# Patient Record
Sex: Male | Born: 1960
Health system: Southern US, Community
[De-identification: ages and names within clinical notes are randomized; demographics above are authoritative.]

## PROBLEM LIST (undated history)

## (undated) DIAGNOSIS — I214 Non-ST elevation (NSTEMI) myocardial infarction: Secondary | ICD-10-CM

## (undated) DIAGNOSIS — E785 Hyperlipidemia, unspecified: Secondary | ICD-10-CM

## (undated) DIAGNOSIS — Z720 Tobacco use: Secondary | ICD-10-CM

## (undated) DIAGNOSIS — F32A Depression, unspecified: Secondary | ICD-10-CM

## (undated) DIAGNOSIS — I2510 Atherosclerotic heart disease of native coronary artery without angina pectoris: Secondary | ICD-10-CM

## (undated) DIAGNOSIS — I519 Heart disease, unspecified: Secondary | ICD-10-CM

## (undated) DIAGNOSIS — I9789 Other postprocedural complications and disorders of the circulatory system, not elsewhere classified: Secondary | ICD-10-CM

## (undated) DIAGNOSIS — I4891 Unspecified atrial fibrillation: Secondary | ICD-10-CM

## (undated) DIAGNOSIS — Z860100 Personal history of colon polyps, unspecified: Secondary | ICD-10-CM

## (undated) DIAGNOSIS — Z5189 Encounter for other specified aftercare: Secondary | ICD-10-CM

## (undated) DIAGNOSIS — M1990 Unspecified osteoarthritis, unspecified site: Secondary | ICD-10-CM

## (undated) DIAGNOSIS — Z8619 Personal history of other infectious and parasitic diseases: Secondary | ICD-10-CM

## (undated) DIAGNOSIS — Z806 Family history of leukemia: Secondary | ICD-10-CM

## (undated) HISTORY — DX: Unspecified osteoarthritis, unspecified site: M19.90

## (undated) HISTORY — DX: Personal history of other infectious and parasitic diseases: Z86.19

## (undated) HISTORY — DX: Family history of leukemia: Z80.6

## (undated) HISTORY — DX: Encounter for other specified aftercare: Z51.89

## (undated) HISTORY — DX: Other postprocedural complications and disorders of the circulatory system, not elsewhere classified: I97.89

## (undated) HISTORY — DX: Unspecified atrial fibrillation: I48.91

## (undated) HISTORY — DX: Personal history of colon polyps, unspecified: Z86.0100

## (undated) HISTORY — DX: Heart disease, unspecified: I51.9

## (undated) HISTORY — DX: Personal history of colonic polyps: Z86.010

## (undated) HISTORY — DX: Depression, unspecified: F32.A

---

## 2005-11-07 ENCOUNTER — Emergency Department: Payer: Self-pay | Admitting: General Practice

## 2010-08-12 ENCOUNTER — Encounter: Payer: Self-pay | Admitting: Family Medicine

## 2010-08-12 ENCOUNTER — Ambulatory Visit (INDEPENDENT_AMBULATORY_CARE_PROVIDER_SITE_OTHER): Payer: 59 | Admitting: Family Medicine

## 2010-08-12 ENCOUNTER — Other Ambulatory Visit: Payer: Self-pay | Admitting: Family Medicine

## 2010-08-12 DIAGNOSIS — F17200 Nicotine dependence, unspecified, uncomplicated: Secondary | ICD-10-CM | POA: Insufficient documentation

## 2010-08-12 DIAGNOSIS — R5381 Other malaise: Secondary | ICD-10-CM

## 2010-08-12 DIAGNOSIS — R5383 Other fatigue: Secondary | ICD-10-CM

## 2010-08-12 DIAGNOSIS — E291 Testicular hypofunction: Secondary | ICD-10-CM

## 2010-08-12 DIAGNOSIS — Z125 Encounter for screening for malignant neoplasm of prostate: Secondary | ICD-10-CM

## 2010-08-12 DIAGNOSIS — Z0000 Encounter for general adult medical examination without abnormal findings: Secondary | ICD-10-CM

## 2010-08-12 DIAGNOSIS — Z136 Encounter for screening for cardiovascular disorders: Secondary | ICD-10-CM

## 2010-08-12 DIAGNOSIS — E785 Hyperlipidemia, unspecified: Secondary | ICD-10-CM

## 2010-08-12 LAB — BASIC METABOLIC PANEL
BUN: 17 mg/dL (ref 6–23)
CO2: 28 mEq/L (ref 19–32)
Calcium: 9.4 mg/dL (ref 8.4–10.5)
Chloride: 105 mEq/L (ref 96–112)
Creatinine, Ser: 1.1 mg/dL (ref 0.4–1.5)
GFR: 73.95 mL/min (ref >60.00)
Glucose, Bld: 85 mg/dL (ref 70–99)
Potassium: 4.3 mEq/L (ref 3.5–5.1)
Sodium: 140 mEq/L (ref 135–145)

## 2010-08-12 LAB — CBC WITH DIFFERENTIAL/PLATELET
Basophils Absolute: 0 10*3/uL (ref 0.0–0.1)
Basophils Relative: 0.5 % (ref 0.0–3.0)
Eosinophils Absolute: 0.2 10*3/uL (ref 0.0–0.7)
Eosinophils Relative: 2.5 % (ref 0.0–5.0)
HCT: 45.1 % (ref 39.0–52.0)
Hemoglobin: 15.3 g/dL (ref 13.0–17.0)
Lymphocytes Relative: 31 % (ref 12.0–46.0)
Lymphs Abs: 2.4 10*3/uL (ref 0.7–4.0)
MCHC: 34 g/dL (ref 30.0–36.0)
MCV: 93.8 fl (ref 78.0–100.0)
Monocytes Absolute: 0.7 10*3/uL (ref 0.1–1.0)
Monocytes Relative: 8.9 % (ref 3.0–12.0)
Neutro Abs: 4.3 10*3/uL (ref 1.4–7.7)
Neutrophils Relative %: 57.1 % (ref 43.0–77.0)
Platelets: 283 10*3/uL (ref 150.0–400.0)
RBC: 4.8 Mil/uL (ref 4.22–5.81)
RDW: 13.4 % (ref 11.5–14.6)
WBC: 7.6 10*3/uL (ref 4.5–10.5)

## 2010-08-12 LAB — LIPID PANEL
Cholesterol: 229 mg/dL — ABNORMAL HIGH (ref 0–200)
HDL: 42.6 mg/dL (ref >39.00)
Total CHOL/HDL Ratio: 5
Triglycerides: 117 mg/dL (ref 0.0–149.0)
VLDL: 23.4 mg/dL (ref 0.0–40.0)

## 2010-08-12 LAB — B12 AND FOLATE PANEL
Folate: 14.3 ng/mL (ref >5.9)
Vitamin B-12: 198 pg/mL — ABNORMAL LOW (ref 211–911)

## 2010-08-12 LAB — PSA: PSA: 1.16 ng/mL (ref 0.10–4.00)

## 2010-08-12 LAB — TSH: TSH: 0.81 u[IU]/mL (ref 0.35–5.50)

## 2010-08-12 LAB — CONVERTED CEMR LAB: Vit D, 25-Hydroxy: 35 ng/mL (ref 30–89)

## 2010-08-12 LAB — TESTOSTERONE: Testosterone: 214.97 ng/dL — ABNORMAL LOW (ref 350.00–890.00)

## 2010-08-13 LAB — LDL CHOLESTEROL, DIRECT: Direct LDL: 170.7 mg/dL

## 2010-08-14 ENCOUNTER — Encounter: Payer: Self-pay | Admitting: Family Medicine

## 2010-08-14 DIAGNOSIS — R7989 Other specified abnormal findings of blood chemistry: Secondary | ICD-10-CM | POA: Insufficient documentation

## 2010-08-19 NOTE — Assessment & Plan Note (Signed)
Summary: NEW PT TO EST/CPX/CLE   [employer],MAILED NPP   Vital Signs:  Patient profile:   50 year old male Height:      68 inches Weight:      162.25 pounds BMI:     24.76 Temp:     98.1 degrees F oral Pulse rate:   69 / minute Pulse rhythm:   regular BP sitting:   126 / 82  (right arm) Cuff size:   regular  Vitals Entered By: Linde Gillis CMA Duncan Dull) (August 12, 2010 9:56 AM) CC: new patient, establish care, CPX   History of Present Illness: 50 yo here to establish care and for CPX.  H/o low testosterone- previously on Androgel per pt.  Feels like it is likely low. More fatigue lately.  Denies any sexual dysfunction or depression. No CP or SOB. Denies any symptoms of hypothyroidism.  Tobacco abuse- 30 pack year history.  Only quit once for 3 months during boot camp for [employer]. Has never used patches, gums or other smoking cessation aids.  Well man- states he has not had lab work in a couple of years but that always been normal. Overall in good health but "feels older."  Preventive Screening-Counseling & Management  Alcohol-Tobacco     Smoking Status: current  Caffeine-Diet-Exercise     Does Patient Exercise: yes      Drug Use:  no.    Current Medications (verified): 1)  Chantix Starting Month Pak 0.5 Mg X 11 & 1 Mg X 42  Misc (Varenicline Tartrate) .... 0.5mg  By Mouth Once Daily For 3 Days, Then Twice Daily For 4 Days and Then 1mg  By Mouth 2 Times Daily  Allergies (verified): No Known Drug Allergies  Past History:  Family History: Last updated: 08/12/2010 Dad had MI at 52 Mom RA  Social History: Last updated: 08/12/2010 Married, 7 children Current Smoker Alcohol use-yes Drug use-no Regular exercise-yes  Risk Factors: Exercise: yes (08/12/2010)  Risk Factors: Smoking Status: current (08/12/2010)  Past Medical History: Unremarkable  Past Surgical History: Denies surgical history  Family History: Dad had MI at 18 Mom RA  Social  History: Married, 7 children Current Smoker Alcohol use-yes Drug use-no Regular exercise-yes Smoking Status:  current Drug Use:  no Does Patient Exercise:  yes  Review of Systems      See HPI General:  Complains of fatigue and malaise; denies fever and loss of appetite. Eyes:  Denies blurring. ENT:  Denies difficulty swallowing. CV:  Denies chest pain or discomfort. Resp:  Denies shortness of breath. GI:  Denies abdominal pain, bloody stools, and change in bowel habits. GU:  Denies decreased libido, discharge, dysuria, and erectile dysfunction. MS:  Denies joint pain, joint redness, and joint swelling. Derm:  Denies rash. Neuro:  Denies headaches and visual disturbances. Psych:  Denies anxiety and depression. Endo:  Denies cold intolerance and heat intolerance. Heme:  Denies abnormal bruising and bleeding.  Physical Exam  General:  alert, well-developed, well-nourished, and well-hydrated.   Head:  normocephalic and atraumatic.   Eyes:  vision grossly intact, pupils equal, pupils round, and pupils reactive to light.   Ears:  R ear normal and no external deformities.   Nose:  no external deformity.   Mouth:  no gingival abnormalities.   Neck:  No deformities, masses, or tenderness noted. Lungs:  Normal respiratory effort, chest expands symmetrically. Lungs are clear to auscultation, no crackles or wheezes. Heart:  Normal rate and regular rhythm. S1 and S2 normal without gallop, murmur,  click, rub or other extra sounds. Abdomen:  Bowel sounds positive,abdomen soft and non-tender without masses, organomegaly or hernias noted. Msk:  No deformity or scoliosis noted of thoracic or lumbar spine.   Extremities:  no edema Neurologic:  alert & oriented X3 and strength normal in all extremities.   Skin:  Intact without suspicious lesions or rashes Psych:  Cognition and judgment appear intact. Alert and cooperative with normal attention span and concentration. No apparent delusions,  illusions, hallucinations   Impression & Recommendations:  Problem # 1:  Preventive Health Care (ICD-V70.0) Reviewed preventive care protocols, scheduled due services, and updated immunizations Discussed nutrition, exercise, diet, and healthy lifestyle.  BMET, FLP, PSA today (pt counselled and pt requested PSA). Orders: TLB-BMP (Basic Metabolic Panel-BMET) (80048-METABOL)  Problem # 2:  FATIGUE (ICD-780.79) Assessment: New will check labs, including testosterone. fatigue is likely multifactorial. Orders: TLB-CBC Platelet - w/Differential (85025-CBCD) TLB-TSH (Thyroid Stimulating Hormone) (84443-TSH) Specimen Handling (08657) TLB-B12 + Folate Pnl [phone]) T-Vitamin D (25-Hydroxy) (01027-25366)  Problem # 3:  TOBACCO ABUSE (ICD-305.1) Assessment: Deteriorated counselled on smoking cessation. pt ready to quit.  discussed different options and pt would like to try Chantix (no h/o SI).  discussed side effects and pt information handout given about chantix. His updated medication list for this problem includes:    Chantix Starting Month Pak 0.5 Mg X 11 & 1 Mg X 42 Misc (Varenicline tartrate) .Marland Kitchen... 0.5mg  by mouth once daily for 3 days, then twice daily for 4 days and then 1mg  by mouth 2 times daily  Complete Medication List: 1)  Chantix Starting Month Pak 0.5 Mg X 11 & 1 Mg X 42 Misc (Varenicline tartrate) .... 0.5mg  by mouth once daily for 3 days, then twice daily for 4 days and then 1mg  by mouth 2 times daily  Other Orders: Venipuncture (44034) TLB-Lipid Panel (80061-LIPID) TLB-Testosterone, Total (84403-TESTO) TLB-PSA (Prostate Specific Antigen) (84153-PSA)  Patient Instructions: 1)  Great to meet you. 2)  You can buy glucosamine supplements over the counter.  Please do not take more than 2000 mg daily. Prescriptions: CHANTIX STARTING MONTH PAK 0.5 MG X 11 & 1 MG X 42  MISC (VARENICLINE TARTRATE) 0.5mg  by mouth once daily for 3 days, then twice daily for 4 days  and then 1mg  by mouth 2 times daily  #1 pack x 0   Entered and Authorized by:   Ruthe Mannan MD   Signed by:   Ruthe Mannan MD on 08/12/2010   Method used:   Print then Give to Patient   RxID:   [id number]    Orders Added: 1)  Venipuncture [95188] 2)  TLB-Lipid Panel [80061-LIPID] 3)  TLB-BMP (Basic Metabolic Panel-BMET) [80048-METABOL] 4)  TLB-CBC Platelet - w/Differential [85025-CBCD] 5)  TLB-TSH (Thyroid Stimulating Hormone) [84443-TSH] 6)  Specimen Handling [99000] 7)  TLB-B12 + Folate Pnl [82746_82607-B12/FOL] 8)  TLB-Testosterone, Total [84403-TESTO] 9)  T-Vitamin D (25-Hydroxy) [41660-63016] 10)  TLB-PSA (Prostate Specific Antigen) [01093-ATF] 11)  New Patient 40-64 years [99386] 12)  New Patient Level II [99202]    Prior Medications (reviewed today): None Current Allergies (reviewed today): No known allergies

## 2010-08-19 NOTE — Miscellaneous (Signed)
Summary: Orders Update  Clinical Lists Changes  Problems: Added new problem of TESTICULAR HYPOFUNCTION (ICD-257.2) Orders: Added new Referral order of Urology Referral (Urology) - Signed 

## 2011-01-26 ENCOUNTER — Encounter: Payer: Self-pay | Admitting: Family Medicine

## 2011-01-28 ENCOUNTER — Ambulatory Visit (INDEPENDENT_AMBULATORY_CARE_PROVIDER_SITE_OTHER)
Admission: RE | Admit: 2011-01-28 | Discharge: 2011-01-28 | Disposition: A | Discharge status: Home / Self Care | Payer: 59 | Source: Ambulatory Visit | Attending: Family Medicine | Admitting: Family Medicine

## 2011-01-28 ENCOUNTER — Ambulatory Visit (INDEPENDENT_AMBULATORY_CARE_PROVIDER_SITE_OTHER): Payer: 59 | Admitting: Family Medicine

## 2011-01-28 ENCOUNTER — Encounter: Payer: Self-pay | Admitting: Family Medicine

## 2011-01-28 VITALS — BP 120/72 | HR 75 | Temp 97.4°F | Ht 68.0 in | Wt 160.8 lb

## 2011-01-28 DIAGNOSIS — M25562 Pain in left knee: Secondary | ICD-10-CM

## 2011-01-28 DIAGNOSIS — M25569 Pain in unspecified knee: Secondary | ICD-10-CM

## 2011-01-28 DIAGNOSIS — M6752 Plica syndrome, left knee: Secondary | ICD-10-CM | POA: Insufficient documentation

## 2011-01-28 DIAGNOSIS — M6750 Plica syndrome, unspecified knee: Secondary | ICD-10-CM

## 2011-01-28 NOTE — Progress Notes (Signed)
  Subjective:    Patient ID: Jeffrey Spence, male    DOB: 1961-02-10, 50 y.o.   MRN: [id number]  HPI  Pleasant 50 year old male with a history of L knee pain for 2 years or more, history significant for an injury to the extensor of knee as a child in elem school. No surgery then, but did have to rehab the area. This is anterior medial knee pain.   As a child, tore a muscle, maybe quad or other. Now it has gotten more and more sore. Anterior and medial pain. 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 No swelling. Pain with getting up and moving his leg  When sitting down and when trying to get up - will have some pain. Does OK with going up and down stairs.  No locking up of his joint or giving way.  The PMH, PSH, Social History, Family History, Medications, and allergies have been reviewed in [facility], and have been updated if relevant.  Review of Systems REVIEW OF SYSTEMS  GEN: No fevers, chills. Nontoxic. Primarily MSK c/o today. MSK: Detailed in the HPI GI: tolerating PO intake without difficulty Neuro: No numbness, parasthesias, or tingling associated. Otherwise the pertinent positives of the ROS are noted above.      Objective:   Physical Exam   Physical Exam  Blood pressure 120/72, pulse 75, temperature 97.4 F (36.3 C), temperature source Oral, height 5\' 8"  (1.727 m), weight 160 lb 12.8 oz (72.938 kg), SpO2 98.00%.  GEN: Well-developed,well-nourished,in no acute distress; alert,appropriate and cooperative throughout examination HEENT: Normocephalic and atraumatic without obvious abnormalities. Ears, externally no deformities PULM: Breathing comfortably in no respiratory distress EXT: No clubbing, cyanosis, or edema PSYCH: Normally interactive. Cooperative during the interview. Pleasant. Friendly and conversant. Not anxious or depressed appearing. Normal, full affect.  Gait: Normal heel toe pattern ROM: WNL Effusion: neg Echymosis or edema: none Patellar tendon NT Painful PLICA:  LARGE MEDIAL PLICAL BANDS X 2 MEDIALLY, AND 1 ALSO MEDIAL AND NEAR PATELLAR TENDON Patellar grind: minimal pos Medial and lateral patellar facet loading: negative medial and lateral joint lines:NT Mcmurray's neg Flexion-pinch neg Varus and valgus stress: stable Lachman: neg Ant and Post drawer: neg Hip abduction, IR, ER: WNL Hip flexion str: 5/5 Hip abd: 5/5 Quad: 5/5 VMO atrophy:No Hamstring concentric and eccentric: 5/5       Assessment & Plan:   1. Plica syndrome of left knee    2. Left knee pain  DG Knee 1-2 Views Left, DG Knee Bilateral Standing AP   X-rays: AP Bilateral Weight-bearing, Weightbearing Lateral, Sunrise views Indication: knee pain Findings: No significant OA of the knee. Good alignment  Plica Syndrome  Painful plica band ID'd on exam Massage for several weeks with hand multiple times a day and with an ice cube Plica Inj today  Ultimately, if all treatment fails, surgical synovectomy could be considered.  Knee Inj, Left Patient verbally consented to procedure. Risks, benefits, and alternatives explained including pain, treatment failure and lightening of the skin. Sterilely prepped with betadine. Ethyl cholride used for anesthesia. 4 cc Lidocaine 1% mixed with 1 cc of Kenalog 40 mg injected along the more medial plical band of left knee then the inferior band. No complications with procedure and tolerated well. Patient had decreased pain post-injection.

## 2011-01-28 NOTE — Patient Instructions (Signed)
PLICA BAND or PLICA on the knee  Band of tissue / Kink in the synovial lining. The joint lining.  Use your fingers a few times a day for the next few weeks to massage that area to help break down the tissue. You can also use an ice cube.  

## 2011-06-23 HISTORY — PX: OTHER SURGICAL HISTORY: SHX169

## 2011-06-29 ENCOUNTER — Telehealth: Payer: Self-pay | Admitting: *Deleted

## 2011-06-29 DIAGNOSIS — S99921A Unspecified injury of right foot, initial encounter: Secondary | ICD-10-CM

## 2011-06-29 NOTE — Telephone Encounter (Signed)
Patient called stating that he dropped a log on his foot Saturday at work and he was wearing steel toe shoes. He states that he has a lot of bruising. Patient states that he thinks he is okay, but thinks that he should have it x-rayed and wants to know if you will order an x-ray for him? Patient states that he did report this to this employer.

## 2011-06-29 NOTE — Telephone Encounter (Signed)
Nikki,  Please find out more information- which foot? We can order the xray but I need to know which foot and if it's foot only. Thanks.

## 2011-06-30 NOTE — Telephone Encounter (Signed)
Left message on cell phone voicemail for patient to return call. 

## 2011-06-30 NOTE — Telephone Encounter (Signed)
Referral placed.

## 2011-06-30 NOTE — Telephone Encounter (Signed)
Spoke with patient and he stated that it's his right foot, no other injuries per patient.

## 2011-06-30 NOTE — Telephone Encounter (Signed)
Patient will wait to hear from [facility] regarding referral for xray.

## 2011-07-02 ENCOUNTER — Encounter: Payer: Self-pay | Admitting: Family Medicine

## 2011-07-02 ENCOUNTER — Telehealth: Payer: Self-pay | Admitting: Internal Medicine

## 2011-07-02 DIAGNOSIS — S92909A Unspecified fracture of unspecified foot, initial encounter for closed fracture: Secondary | ICD-10-CM

## 2011-07-02 NOTE — Telephone Encounter (Signed)
[facility] called and stated he has a broke foot.  He had his CD with him.

## 2011-07-02 NOTE — Telephone Encounter (Signed)
Ok will refer to ortho.

## 2011-07-02 NOTE — Telephone Encounter (Signed)
Called patient and left a message on cell phone voicemail for him to return call.

## 2011-07-03 NOTE — Telephone Encounter (Signed)
Patient advised as instructed via telephone, he would like to see someone in [place].  He will wait to hear from [facility].

## 2012-10-28 ENCOUNTER — Other Ambulatory Visit: Payer: Self-pay | Admitting: Family Medicine

## 2012-10-28 DIAGNOSIS — Z125 Encounter for screening for malignant neoplasm of prostate: Secondary | ICD-10-CM

## 2012-10-28 DIAGNOSIS — Z8639 Personal history of other endocrine, nutritional and metabolic disease: Secondary | ICD-10-CM

## 2012-10-28 DIAGNOSIS — Z136 Encounter for screening for cardiovascular disorders: Secondary | ICD-10-CM

## 2012-10-28 DIAGNOSIS — Z0000 Encounter for general adult medical examination without abnormal findings: Secondary | ICD-10-CM

## 2012-11-15 ENCOUNTER — Other Ambulatory Visit (INDEPENDENT_AMBULATORY_CARE_PROVIDER_SITE_OTHER): Payer: 59

## 2012-11-15 DIAGNOSIS — Z8639 Personal history of other endocrine, nutritional and metabolic disease: Secondary | ICD-10-CM

## 2012-11-15 DIAGNOSIS — Z0000 Encounter for general adult medical examination without abnormal findings: Secondary | ICD-10-CM

## 2012-11-15 DIAGNOSIS — Z136 Encounter for screening for cardiovascular disorders: Secondary | ICD-10-CM

## 2012-11-15 DIAGNOSIS — Z125 Encounter for screening for malignant neoplasm of prostate: Secondary | ICD-10-CM

## 2012-11-15 LAB — LIPID PANEL
Cholesterol: 202 mg/dL — ABNORMAL HIGH (ref 0–200)
HDL: 38.1 mg/dL — ABNORMAL LOW (ref >39.00)
Total CHOL/HDL Ratio: 5
Triglycerides: 129 mg/dL (ref 0.0–149.0)
VLDL: 25.8 mg/dL (ref 0.0–40.0)

## 2012-11-15 LAB — COMPREHENSIVE METABOLIC PANEL
ALT: 17 U/L (ref 0–53)
AST: 17 U/L (ref 0–37)
Albumin: 3.8 g/dL (ref 3.5–5.2)
Alkaline Phosphatase: 89 U/L (ref 39–117)
BUN: 15 mg/dL (ref 6–23)
CO2: 24 mEq/L (ref 19–32)
Calcium: 9.1 mg/dL (ref 8.4–10.5)
Chloride: 107 mEq/L (ref 96–112)
Creatinine, Ser: 1 mg/dL (ref 0.4–1.5)
GFR: 85.49 mL/min (ref >60.00)
Glucose, Bld: 91 mg/dL (ref 70–99)
Potassium: 4 mEq/L (ref 3.5–5.1)
Sodium: 140 mEq/L (ref 135–145)
Total Bilirubin: 0.7 mg/dL (ref 0.3–1.2)
Total Protein: 6.4 g/dL (ref 6.0–8.3)

## 2012-11-15 LAB — VITAMIN B12: Vitamin B-12: 291 pg/mL (ref 211–911)

## 2012-11-15 LAB — PSA: PSA: 0.89 ng/mL (ref 0.10–4.00)

## 2012-11-15 LAB — LDL CHOLESTEROL, DIRECT: Direct LDL: 140.9 mg/dL

## 2012-11-17 ENCOUNTER — Other Ambulatory Visit: Payer: 59

## 2012-11-17 ENCOUNTER — Encounter: Payer: 59 | Admitting: Family Medicine

## 2012-11-23 ENCOUNTER — Telehealth: Payer: Self-pay

## 2012-11-23 NOTE — Telephone Encounter (Signed)
Pt had seen lab results on Mychart but could not see PSA result.  I advised pt Dr Dayton Martes would discuss lab results at his 01/12/13 CPX; pt understood that but wanted to make sure PSA was done and was within normal limits. Advised pt PSA was 0.89 and normal range is 0.1 - 4. Pt said OK and would discuss with Dr Dayton Martes at appt.

## 2013-01-12 ENCOUNTER — Encounter: Payer: Self-pay | Admitting: Gastroenterology

## 2013-01-12 ENCOUNTER — Encounter: Payer: Self-pay | Admitting: Family Medicine

## 2013-01-12 ENCOUNTER — Ambulatory Visit (INDEPENDENT_AMBULATORY_CARE_PROVIDER_SITE_OTHER): Payer: 59 | Admitting: Family Medicine

## 2013-01-12 VITALS — BP 102/70 | HR 68 | Temp 97.5°F | Ht 67.75 in | Wt 167.0 lb

## 2013-01-12 DIAGNOSIS — Z0000 Encounter for general adult medical examination without abnormal findings: Secondary | ICD-10-CM | POA: Insufficient documentation

## 2013-01-12 DIAGNOSIS — M179 Osteoarthritis of knee, unspecified: Secondary | ICD-10-CM | POA: Insufficient documentation

## 2013-01-12 DIAGNOSIS — D229 Melanocytic nevi, unspecified: Secondary | ICD-10-CM

## 2013-01-12 DIAGNOSIS — M1710 Unilateral primary osteoarthritis, unspecified knee: Secondary | ICD-10-CM

## 2013-01-12 DIAGNOSIS — D239 Other benign neoplasm of skin, unspecified: Secondary | ICD-10-CM

## 2013-01-12 DIAGNOSIS — IMO0002 Reserved for concepts with insufficient information to code with codable children: Secondary | ICD-10-CM

## 2013-01-12 DIAGNOSIS — Z1211 Encounter for screening for malignant neoplasm of colon: Secondary | ICD-10-CM

## 2013-01-12 DIAGNOSIS — F17200 Nicotine dependence, unspecified, uncomplicated: Secondary | ICD-10-CM

## 2013-01-12 NOTE — Progress Notes (Signed)
Subjective:    Patient ID: Jeffrey Spence, male    DOB: 05-Jul-1960, 52 y.o.   MRN: [id number]  HPI  52 yo pleasant male here for CPX.  Still smoking 1 ppd- not ready to quit.  Very physically active job.  Knees hurt him after he sits for prolonged period of time.  No back pain.  Left knee often swollen at end of the day. Not taking any NSAIDs.  He is working on diet- admits to not eating well.  Lab Results  Component Value Date   CHOL 202* 11/15/2012   HDL 38.10* 11/15/2012   LDLDIRECT 140.9 11/15/2012   TRIG 129.0 11/15/2012   CHOLHDL 5 11/15/2012   Lab Results  Component Value Date   PSA 0.89 11/15/2012   PSA 1.16 08/12/2010   Lab Results  Component Value Date   CREATININE 1.0 11/15/2012   Patient Active Problem List   Diagnosis Date Noted  . Routine general medical examination at a health care facility 01/12/2013  . TESTICULAR HYPOFUNCTION 08/14/2010  . TOBACCO ABUSE 08/12/2010   No past medical history on file. No past surgical history on file. History  Substance Use Topics  . Smoking status: Current Every Day Smoker  . Smokeless tobacco: Not on file  . Alcohol Use: Yes   Family History  Problem Relation Age of Onset  . Arthritis Mother     ra  . Heart attack Father 6   No Known Allergies Current Outpatient Prescriptions on File Prior to Visit  Medication Sig Dispense Refill  . acetaminophen (TYLENOL) 500 MG tablet Take 1,000 mg by mouth every 6 (six) hours as needed.        . vitamin B-12 (CYANOCOBALAMIN) 1000 MCG tablet Take 1,000 mcg by mouth daily.         No current facility-administered medications on file prior to visit.   The PMH, PSH, Social History, Family History, Medications, and allergies have been reviewed in [facility], and have been updated if relevant.   Review of Systems See HPI Patient reports no  vision/ hearing changes,anorexia, weight change, fever ,adenopathy, persistant / recurrent hoarseness, swallowing issues, chest pain, edema,persistant  / recurrent cough, hemoptysis, dyspnea(rest, exertional, paroxysmal nocturnal), gastrointestinal  bleeding (melena, rectal bleeding), abdominal pain, excessive heart burn, GU symptoms(dysuria, hematuria, pyuria, voiding/incontinence  Issues) syncope, focal weakness, severe memory loss, concerning skin lesions, depression, anxiety, abnormal bruising/bleeding     Objective:   Physical Exam BP 102/70  Pulse 68  Temp(Src) 97.5 F (36.4 C)  Ht 5' 7.75" (1.721 m)  Wt 167 lb (75.751 kg)  BMI 25.58 kg/m2 General:  Pleasant male in NAD Eyes:  PERRL Ears:  External ear exam shows no significant lesions or deformities.  Otoscopic examination reveals clear canals, tympanic membranes are intact bilaterally without bulging, retraction, inflammation or discharge. Hearing is grossly normal bilaterally. Nose:  External nasal examination shows no deformity or inflammation. Nasal mucosa are pink and moist without lesions or exudates. Mouth:  Oral mucosa and oropharynx without lesions or exudates.  Teeth in good repair. Neck:  no carotid bruit or thyromegaly no cervical or supraclavicular lymphadenopathy  Lungs:  Normal respiratory effort, chest expands symmetrically. Lungs are clear to auscultation, no crackles or wheezes. Heart:  Normal rate and regular rhythm. S1 and S2 normal without gallop, murmur, click, rub or other extra sounds. Abdomen:  Bowel sounds positive,abdomen soft and non-tender without masses, organomegaly or hernias noted. Pulses:  R and L posterior tibial pulses are full and equal bilaterally  Extremities:  no edema  Skin:  Raised atypical nevus right temple        Assessment & Plan:  1. Routine general medical examination at a health care facility Reviewed preventive care protocols, scheduled due services, and updated immunizations Discussed nutrition, exercise, diet, and healthy lifestyle.   2. TOBACCO ABUSE Not ready to quit.  Did discuss importance of quitting in terms of his  health.  Also has FH of CAD.  3. Special screening for malignant neoplasms, colon  - Ambulatory referral to Gastroenterology  4. OA (osteoarthritis) of knee Advised NSAIDs in low dose with food (see AVS).  5. Atypical nevus  - Ambulatory referral to Dermatology

## 2013-01-12 NOTE — Patient Instructions (Addendum)
Great to see you. Start taking 1 alleve in the mornings with food and an extra strength tylenol (650 mg ) in the evening.  Keep me updated with your symptoms.  Please stop by to see Shirlee Limerick on your way out to set up your referral for your colonoscopy.

## 2013-01-20 ENCOUNTER — Ambulatory Visit (INDEPENDENT_AMBULATORY_CARE_PROVIDER_SITE_OTHER): Payer: 59 | Admitting: Family Medicine

## 2013-01-20 ENCOUNTER — Encounter: Payer: Self-pay | Admitting: Family Medicine

## 2013-01-20 VITALS — BP 118/78 | HR 76 | Temp 97.9°F | Ht 67.75 in | Wt 167.0 lb

## 2013-01-20 DIAGNOSIS — M7989 Other specified soft tissue disorders: Secondary | ICD-10-CM | POA: Insufficient documentation

## 2013-01-20 NOTE — Progress Notes (Signed)
  Subjective:    Patient ID: Jeffrey Spence, male    DOB: [birth year], 52 y.o.   MRN: [id number]  HPI  Very pleasant 52 yo here for two weeks of swelling above his right wrist.  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  No known injury.  Was briefly painful one day but has not hurt since. No erythema or warmth.  He is taking Advil and Tylenol for OA.  Patient Active Problem List   Diagnosis Date Noted  . Swelling of arm 01/20/2013  . Routine general medical examination at a health care facility 01/12/2013  . OA (osteoarthritis) of knee 01/12/2013  . TESTICULAR HYPOFUNCTION 08/14/2010  . TOBACCO ABUSE 08/12/2010   No past medical history on file. No past surgical history on file. History  Substance Use Topics  . Smoking status: Current Every Day Smoker  . Smokeless tobacco: Not on file  . Alcohol Use: Yes   Family History  Problem Relation Age of Onset  . Arthritis Mother     ra  . Heart attack Father 90  . Cancer Father   . Cancer Paternal Uncle     leukemia   No Known Allergies Current Outpatient Prescriptions on File Prior to Visit  Medication Sig Dispense Refill  . acetaminophen (TYLENOL) 500 MG tablet Take 1,000 mg by mouth every 6 (six) hours as needed.        . Multiple Vitamin (MULTIVITAMIN) tablet Take 1 tablet by mouth daily.      . vitamin B-12 (CYANOCOBALAMIN) 1000 MCG tablet Take 1,000 mcg by mouth daily.         No current facility-administered medications on file prior to visit.   The PMH, PSH, Social History, Family History, Medications, and allergies have been reviewed in [facility], and have been updated if relevant.    Review of Systems    See HPI No CP No SOB Objective:   Physical Exam  Constitutional: He appears well-developed and well-nourished. No distress.  HENT:  Head: Normocephalic and atraumatic.  Musculoskeletal:       Arms: Psychiatric: He has a normal mood and affect. His speech is normal and behavior is normal. Judgment and thought  content normal. Cognition and memory are normal.   BP 118/78  Pulse 76  Temp(Src) 97.9 F (36.6 C)  Ht 5' 7.75" (1.721 m)  Wt 167 lb (75.751 kg)  BMI 25.58 kg/m2        Assessment & Plan:  1. Swelling of arm New- most likely a tendonopathy.  No consistent with gout or OA. Rule out DVT with doppler. Start ASA 81 mg (ok to hold NSAID for now). The patient indicates understanding of these issues and agrees with the plan.  - Upper extremity Venous Duplex Right; Future

## 2013-01-20 NOTE — Patient Instructions (Addendum)
Good to see you. Please stop by to see Jeffrey Spence on your way out to set up your ultrasound. Over the weekend, please start taking Aspirin 81 mg daily.  Once we have your results, we will come up with a long term plan based on the findings. Have a good weekend.

## 2013-01-25 ENCOUNTER — Telehealth: Payer: Self-pay | Admitting: *Deleted

## 2013-01-25 ENCOUNTER — Ambulatory Visit: Payer: Self-pay | Admitting: Family Medicine

## 2013-01-25 NOTE — Telephone Encounter (Signed)
Advised patient of results from ultrasound done this morning.  Copy of report sent for scanning.

## 2013-01-31 ENCOUNTER — Encounter: Payer: Self-pay | Admitting: Family Medicine

## 2013-02-22 ENCOUNTER — Ambulatory Visit (AMBULATORY_SURGERY_CENTER): Payer: 59 | Admitting: *Deleted

## 2013-02-22 VITALS — Ht 67.75 in | Wt 160.4 lb

## 2013-02-22 DIAGNOSIS — Z1211 Encounter for screening for malignant neoplasm of colon: Secondary | ICD-10-CM

## 2013-02-22 MED ORDER — PREPOPIK 10-3.5-12 MG-GM-GM PO PACK: 1.0000 | PACK | Freq: Once | ORAL | Status: DC

## 2013-02-22 NOTE — Progress Notes (Signed)
No allergies to eggs or soy. No problems with anesthesia.  

## 2013-02-23 ENCOUNTER — Encounter: Payer: Self-pay | Admitting: Gastroenterology

## 2013-03-08 ENCOUNTER — Encounter: Payer: Self-pay | Admitting: Gastroenterology

## 2013-03-08 ENCOUNTER — Ambulatory Visit (AMBULATORY_SURGERY_CENTER): Payer: 59 | Admitting: Gastroenterology

## 2013-03-08 VITALS — BP 111/73 | HR 64 | Temp 97.0°F | Resp 16 | Ht 67.0 in | Wt 160.0 lb

## 2013-03-08 DIAGNOSIS — D126 Benign neoplasm of colon, unspecified: Secondary | ICD-10-CM

## 2013-03-08 DIAGNOSIS — Z1211 Encounter for screening for malignant neoplasm of colon: Secondary | ICD-10-CM

## 2013-03-08 MED ORDER — SODIUM CHLORIDE 0.9 % IV SOLN: 500.0000 mL | INTRAVENOUS | Status: DC

## 2013-03-08 NOTE — Progress Notes (Signed)
Called to room to assist during endoscopic procedure.  Patient ID and intended procedure confirmed with present staff. Received instructions for my participation in the procedure from the performing physician.  

## 2013-03-08 NOTE — Progress Notes (Signed)
Lidocaine-40mg IV prior to Propofol InductionPropofol given over incremental dosages 

## 2013-03-08 NOTE — Progress Notes (Signed)
Patient did not have preoperative order for IV antibiotic SSI prophylaxis. (G8918)  Patient did not experience any of the following events: a burn prior to discharge; a fall within the facility; wrong site/side/patient/procedure/implant event; or a hospital transfer or hospital admission upon discharge from the facility. (G8907)  

## 2013-03-08 NOTE — Patient Instructions (Addendum)
Impressions/recommendations:  Polyps (handout given) Hemorrhoids (handout given)  Hold aspirin, aspirin containing products and antiinflammatory medicines for two weeks.  Repeat colonoscopy to be determined after pathology review.  YOU HAD AN ENDOSCOPIC PROCEDURE TODAY AT THE [facility]: Refer to the procedure report that was given to you for any specific questions about what was found during the examination.  If the procedure report does not answer your questions, please call your gastroenterologist to clarify.  If you requested that your care partner not be given the details of your procedure findings, then the procedure report has been included in a sealed envelope for you to review at your convenience later.  YOU SHOULD EXPECT: Some feelings of bloating in the abdomen. Passage of more gas than usual.  Walking can help get rid of the air that was put into your GI tract during the procedure and reduce the bloating. If you had a lower endoscopy (such as a colonoscopy or flexible sigmoidoscopy) you may notice spotting of blood in your stool or on the toilet paper. If you underwent a bowel prep for your procedure, then you may not have a normal bowel movement for a few days.  DIET: Your first meal following the procedure should be a light meal and then it is ok to progress to your normal diet.  A half-sandwich or bowl of soup is an example of a good first meal.  Heavy or fried foods are harder to digest and may make you feel nauseous or bloated.  Likewise meals heavy in dairy and vegetables can cause extra gas to form and this can also increase the bloating.  Drink plenty of fluids but you should avoid alcoholic beverages for 24 hours.  ACTIVITY: Your care partner should take you home directly after the procedure.  You should plan to take it easy, moving slowly for the rest of the day.  You can resume normal activity the day after the procedure however you should NOT DRIVE or use heavy  machinery for 24 hours (because of the sedation medicines used during the test).    SYMPTOMS TO REPORT IMMEDIATELY: A gastroenterologist can be reached at any hour.  During normal business hours, 8:30 AM to 5:00 PM Monday through Friday, call [phone].  After hours and on weekends, please call the GI answering service at [phone] who will take a message and have the physician on call contact you.   Following lower endoscopy (colonoscopy or flexible sigmoidoscopy):  Excessive amounts of blood in the stool  Significant tenderness or worsening of abdominal pains  Swelling of the abdomen that is new, acute  Fever of 100F or higher   FOLLOW UP: If any biopsies were taken you will be contacted by phone or by letter within the next 1-3 weeks.  Call your gastroenterologist if you have not heard about the biopsies in 3 weeks.  Our staff will call the home number listed on your records the next business day following your procedure to check on you and address any questions or concerns that you may have at that time regarding the information given to you following your procedure. This is a courtesy call and so if there is no answer at the home number and we have not heard from you through the emergency physician on call, we will assume that you have returned to your regular daily activities without incident.  SIGNATURES/CONFIDENTIALITY: You and/or your care partner have signed paperwork which will be entered into your electronic medical record.  These signatures attest to the fact that that the information above on your After Visit Summary has been reviewed and is understood.  Full responsibility of the confidentiality of this discharge information lies with you and/or your care-partner.

## 2013-03-08 NOTE — Op Note (Addendum)
[facility] [address] [place], [address]   COLONOSCOPY PROCEDURE REPORT  PATIENT: Jeffrey Spence, Jeffrey Spence  MR#: [id number] BIRTHDATE: 10/10/1960 , 51  yrs. old GENDER: Male ENDOSCOPIST: Meryl Dare, MD, [facility] REFERRED XB:JYNWG Aron, M.D. PROCEDURE DATE:  03/08/2013 PROCEDURE:   Colonoscopy with snare polypectomy First Screening Colonoscopy - Avg.  risk and is 50 yrs.  old or older Yes.  Prior Negative Screening - Now for repeat screening. N/A  History of Adenoma - Now for follow-up colonoscopy & has been > or = to 3 yrs.  N/A  Polyps Removed Today? Yes. ASA CLASS:   Class I INDICATIONS:average risk screening. MEDICATIONS: MAC sedation, administered by CRNA and propofol (Diprivan) 450mg  IV DESCRIPTION OF PROCEDURE:   After the risks benefits and alternatives of the procedure were thoroughly explained, informed consent was obtained.  A digital rectal exam revealed no abnormalities of the rectum.   The LB [id number]  endoscope was introduced through the anus and advanced to the cecum, which was identified by both the appendix and ileocecal valve. No adverse events experienced.   The quality of the prep was Prepopik good The instrument was then slowly withdrawn as the colon was fully examined.  COLON FINDINGS: Two sessile polyps measuring 7-8 mm in size were found at the cecum and in the ascending colon.  A polypectomy was performed using snare cautery.  The resection was complete and the polyp tissue was completely retrieved.   Ten sessile polyps measuring 5-8 mm in size were found in the transverse colon.  A polypectomy was performed with a cold snare and using snare cautery.  The resection was complete and the polyp tissue was completely retrieved.   Two pedunculated polyps measuring 1 cm in size were found in the descending colon.  A polypectomy was performed using snare cautery.  The resection was complete and the polyp tissue was completely retrieved.    A sessile polyp measuring 5 mm in size was found in the descending colon.  A polypectomy was performed with a cold snare.  The resection was complete and the polyp tissue was completely retrieved.   The colon was otherwise normal.  There was no diverticulosis, inflammation, polyps or cancers unless previously stated.  Retroflexed views revealed small internal hemorrhoids. The time to cecum=2 minutes 12 seconds. Withdrawal time=24 minutes 20 seconds.  The scope was withdrawn and the procedure completed. COMPLICATIONS: There were no complications.   ENDOSCOPIC IMPRESSION: 1.   Two sessile polyps measuring 7-8 mm at the cecum and in the ascending colon; polypectomy performed using snare cautery 2.   Tweleve sessile polyps measuring 5-8 mm in the transverse colon; polypectomy performed with a cold snare and using snare cautery 3.   Two pedunculated polyps measuring 1 cm in the descending colon; polypectomy performed using snare cautery 4.   Sessile polyp measuring 5 mm in the descending colon; polypectomy performed with a cold snare 5.   Small internal hemorrhoids  RECOMMENDATIONS: 1.  Await pathology results 2.  Hold aspirin, aspirin products, and anti-inflammatory medication for 2 weeks. 3.  Colonoscopy follow up interval to be determined after pathology review  eSigned:  Meryl Dare, MD, [facility] 03/08/2013 10:49 AM Revised: 03/08/2013 10:49 AM     PATIENT NAME:  Jeffrey Spence, Jeffrey Spence MR#: [id number]

## 2013-03-09 ENCOUNTER — Telehealth: Payer: Self-pay | Admitting: *Deleted

## 2013-03-09 NOTE — Telephone Encounter (Signed)
No answer, left message to call if questions or concerns. 

## 2013-03-14 ENCOUNTER — Encounter: Payer: Self-pay | Admitting: Gastroenterology

## 2013-04-27 ENCOUNTER — Other Ambulatory Visit: Payer: Self-pay

## 2014-02-01 ENCOUNTER — Encounter: Payer: 59 | Admitting: Family Medicine

## 2014-02-01 DIAGNOSIS — Z0289 Encounter for other administrative examinations: Secondary | ICD-10-CM

## 2014-02-02 ENCOUNTER — Telehealth: Payer: Self-pay | Admitting: Family Medicine

## 2014-02-02 NOTE — Telephone Encounter (Signed)
Opened in error

## 2015-01-24 ENCOUNTER — Encounter: Payer: Self-pay | Admitting: Gastroenterology

## 2015-03-27 ENCOUNTER — Ambulatory Visit (AMBULATORY_SURGERY_CENTER): Payer: 59 | Admitting: *Deleted

## 2015-03-27 VITALS — Ht 68.0 in | Wt 164.6 lb

## 2015-03-27 DIAGNOSIS — Z86010 Personal history of colonic polyps: Secondary | ICD-10-CM

## 2015-03-27 MED ORDER — NA SULFATE-K SULFATE-MG SULF 17.5-3.13-1.6 GM/177ML PO SOLN: ORAL | Status: DC

## 2015-03-27 NOTE — Progress Notes (Signed)
No allergies to eggs or soy. No problems with anesthesia.  Pt given Emmi instructions for colonoscopy  No oxygen use  No diet drug use  

## 2015-04-10 ENCOUNTER — Encounter: Payer: Self-pay | Admitting: Gastroenterology

## 2015-04-10 ENCOUNTER — Ambulatory Visit (AMBULATORY_SURGERY_CENTER): Payer: 59 | Admitting: Gastroenterology

## 2015-04-10 VITALS — BP 114/75 | HR 67 | Temp 97.1°F | Resp 19 | Ht 68.0 in | Wt 164.0 lb

## 2015-04-10 DIAGNOSIS — D123 Benign neoplasm of transverse colon: Secondary | ICD-10-CM | POA: Diagnosis not present

## 2015-04-10 DIAGNOSIS — D124 Benign neoplasm of descending colon: Secondary | ICD-10-CM

## 2015-04-10 DIAGNOSIS — D128 Benign neoplasm of rectum: Secondary | ICD-10-CM

## 2015-04-10 DIAGNOSIS — D129 Benign neoplasm of anus and anal canal: Secondary | ICD-10-CM

## 2015-04-10 DIAGNOSIS — Z86010 Personal history of colonic polyps: Secondary | ICD-10-CM | POA: Diagnosis present

## 2015-04-10 DIAGNOSIS — D125 Benign neoplasm of sigmoid colon: Secondary | ICD-10-CM | POA: Diagnosis not present

## 2015-04-10 MED ORDER — SODIUM CHLORIDE 0.9 % IV SOLN: 500.0000 mL | INTRAVENOUS | Status: DC

## 2015-04-10 NOTE — Progress Notes (Signed)
Called to room to assist during endoscopic procedure.  Patient ID and intended procedure confirmed with present staff. Received instructions for my participation in the procedure from the performing physician.  

## 2015-04-10 NOTE — Progress Notes (Signed)
Report to PACU, RN, vss, BBS= Clear.  

## 2015-04-10 NOTE — Op Note (Signed)
[place]  [address] [place], [address]   COLONOSCOPY PROCEDURE REPORT  PATIENT: Jeffrey Spence, Jeffrey Spence  MR#: [id number] BIRTHDATE: 11-Jul-1960 , 75  yrs. old GENDER: male ENDOSCOPIST: Ladene Artist, MD, [facility] REFERRED BY:  Arnette Norris, M.D. PROCEDURE DATE:  04/10/2015 PROCEDURE:   Colonoscopy, surveillance , Colonoscopy with biopsy, and Colonoscopy with snare polypectomy First Screening Colonoscopy - Avg.  risk and is 50 yrs.  old or older - No.  Prior Negative Screening - Now for repeat screening. N/A  History of Adenoma - Now for follow-up colonoscopy & has been > or = to 3 yrs.  No.  It has been less than 3 yrs since last colonoscopy.  Medical reason.  Polyps removed today? Yes ASA CLASS:   Class II INDICATIONS:Surveillance due to prior colonic neoplasia and Advanced Neoplasm (> 10 adenomatous polyps). MEDICATIONS: Monitored anesthesia care and Propofol 300 mg IV DESCRIPTION OF PROCEDURE:   After the risks benefits and alternatives of the procedure were thoroughly explained, informed consent was obtained.  The digital rectal exam revealed no abnormalities of the rectum.   The LB [id number]  endoscope was introduced through the anus and advanced to the cecum, which was identified by both the appendix and ileocecal valve. No adverse events experienced.   The quality of the prep was excellent. (Suprep was used)  The instrument was then slowly withdrawn as the colon was fully examined. Estimated blood loss is zero unless otherwise noted in this procedure report.       COLON FINDINGS: Seven sessile polyps measuring 4-5 mm in size were found in the rectum (1), sigmoid colon (2), and transverse colon (4).  Polypectomies were performed with cold forceps.  The resection was complete, the polyp tissue was completely retrieved and sent to histology. Ten sessile polyps measuring 6-9 mm in size were found in the rectum (1) descending colon (4), and  transverse colon (5).  Polypectomies were performed with a cold snare.  The resection was complete, the polyp tissue was completely retrieved and sent to histology.   The examination was otherwise normal. Retroflexed views revealed internal Grade I hemorrhoids. The time to cecum = 13.4 Withdrawal time = 18.1   The scope was withdrawn and the procedure completed. COMPLICATIONS: There were no immediate complications.  ENDOSCOPIC IMPRESSION: 1.   Seven sessile polyps in the rectum, sigmoid, and transverse colon; polypectomies performed with cold forceps 2.   Ten sessile polyps in the rectum, descending, and transverse colon; polypectomies performed with a cold snare 3.   Grade l internal hemorrhoids  RECOMMENDATIONS: 1.  Hold Aspirin and all other NSAIDS for 2 weeks. 2.  Await pathology results 3.  Genetic testing evaluation 4.  Repeat Colonoscopy in 1-2 years pending genetics evaluation and pathology review  eSigned:  Ladene Artist, MD, [facility] 04/10/2015 2:45 PM   [C   PATIENT NAME:  [name], [name] MR#: [id number]

## 2015-04-10 NOTE — Patient Instructions (Signed)
YOU HAD AN ENDOSCOPIC PROCEDURE TODAY AT [facility]:   Refer to the procedure report that was given to you for any specific questions about what was found during the examination.  If the procedure report does not answer your questions, please call your gastroenterologist to clarify.  If you requested that your care partner not be given the details of your procedure findings, then the procedure report has been included in a sealed envelope for you to review at your convenience later.  YOU SHOULD EXPECT: Some feelings of bloating in the abdomen. Passage of more gas than usual.  Walking can help get rid of the air that was put into your GI tract during the procedure and reduce the bloating. If you had a lower endoscopy (such as a colonoscopy or flexible sigmoidoscopy) you may notice spotting of blood in your stool or on the toilet paper. If you underwent a bowel prep for your procedure, you may not have a normal bowel movement for a few days.  Please Note:  You might notice some irritation and congestion in your nose or some drainage.  This is from the oxygen used during your procedure.  There is no need for concern and it should clear up in a day or so.  SYMPTOMS TO REPORT IMMEDIATELY:   Following lower endoscopy (colonoscopy or flexible sigmoidoscopy):  Excessive amounts of blood in the stool  Significant tenderness or worsening of abdominal pains  Swelling of the abdomen that is new, acute  Fever of 100F or higher    For urgent or emergent issues, a gastroenterologist can be reached at any hour by calling [phone].   DIET: Your first meal following the procedure should be a small meal and then it is ok to progress to your normal diet. Heavy or fried foods are harder to digest and may make you feel nauseous or bloated.  Likewise, meals heavy in dairy and vegetables can increase bloating.  Drink plenty of fluids but you should avoid alcoholic beverages for 24  hours.  ACTIVITY:  You should plan to take it easy for the rest of today and you should NOT DRIVE or use heavy machinery until tomorrow (because of the sedation medicines used during the test).    FOLLOW UP: Our staff will call the number listed on your records the next business day following your procedure to check on you and address any questions or concerns that you may have regarding the information given to you following your procedure. If we do not reach you, we will leave a message.  However, if you are feeling well and you are not experiencing any problems, there is no need to return our call.  We will assume that you have returned to your regular daily activities without incident.  If any biopsies were taken you will be contacted by phone or by letter within the next 1-3 weeks.  Please call us at [phone] if you have not heard about the biopsies in 3 weeks.    SIGNATURES/CONFIDENTIALITY: You and/or your care partner have signed paperwork which will be entered into your electronic medical record.  These signatures attest to the fact that that the information above on your After Visit Summary has been reviewed and is understood.  Full responsibility of the confidentiality of this discharge information lies with you and/or your care-partner.   Hold Aspirin and all NSAIDS for 2 weeks,but resume remainder of medications.. Office will contact you for Genetic Testing.

## 2015-04-11 ENCOUNTER — Telehealth: Payer: Self-pay

## 2015-04-11 NOTE — Telephone Encounter (Signed)
Left a message for the pt at [phone]  to call to call us back if any questions or concerns. maw

## 2015-04-17 ENCOUNTER — Encounter: Payer: Self-pay | Admitting: Gastroenterology

## 2015-06-23 DIAGNOSIS — Z5189 Encounter for other specified aftercare: Secondary | ICD-10-CM

## 2015-06-23 DIAGNOSIS — I214 Non-ST elevation (NSTEMI) myocardial infarction: Secondary | ICD-10-CM

## 2015-06-23 HISTORY — DX: Non-ST elevation (NSTEMI) myocardial infarction: I21.4

## 2015-06-23 HISTORY — DX: Encounter for other specified aftercare: Z51.89

## 2015-07-19 ENCOUNTER — Ambulatory Visit (INDEPENDENT_AMBULATORY_CARE_PROVIDER_SITE_OTHER)
Admission: RE | Admit: 2015-07-19 | Discharge: 2015-07-19 | Disposition: A | Discharge status: Home / Self Care | Payer: 59 | Source: Ambulatory Visit | Attending: Primary Care | Admitting: Primary Care

## 2015-07-19 ENCOUNTER — Encounter: Payer: Self-pay | Admitting: Primary Care

## 2015-07-19 ENCOUNTER — Ambulatory Visit (INDEPENDENT_AMBULATORY_CARE_PROVIDER_SITE_OTHER): Payer: 59 | Admitting: Primary Care

## 2015-07-19 VITALS — BP 124/74 | HR 74 | Temp 97.4°F | Ht 67.75 in | Wt 171.0 lb

## 2015-07-19 DIAGNOSIS — R002 Palpitations: Secondary | ICD-10-CM

## 2015-07-19 DIAGNOSIS — M25512 Pain in left shoulder: Secondary | ICD-10-CM

## 2015-07-19 LAB — BASIC METABOLIC PANEL
BUN: 15 mg/dL (ref 6–23)
CO2: 27 mEq/L (ref 19–32)
Calcium: 9 mg/dL (ref 8.4–10.5)
Chloride: 106 mEq/L (ref 96–112)
Creatinine, Ser: 0.98 mg/dL (ref 0.40–1.50)
GFR: 84.62 mL/min (ref >60.00)
Glucose, Bld: 93 mg/dL (ref 70–99)
Potassium: 3.8 mEq/L (ref 3.5–5.1)
Sodium: 139 mEq/L (ref 135–145)

## 2015-07-19 LAB — CBC
HCT: 44.6 % (ref 39.0–52.0)
Hemoglobin: 14.9 g/dL (ref 13.0–17.0)
MCHC: 33.4 g/dL (ref 30.0–36.0)
MCV: 91.1 fl (ref 78.0–100.0)
Platelets: 319 10*3/uL (ref 150.0–400.0)
RBC: 4.9 Mil/uL (ref 4.22–5.81)
RDW: 13.6 % (ref 11.5–15.5)
WBC: 7.7 10*3/uL (ref 4.0–10.5)

## 2015-07-19 LAB — TSH: TSH: 2.63 u[IU]/mL (ref 0.35–4.50)

## 2015-07-19 LAB — LIPID PANEL
Cholesterol: 223 mg/dL — ABNORMAL HIGH (ref 0–200)
HDL: 42.9 mg/dL (ref >39.00)
LDL Cholesterol: 155 mg/dL — ABNORMAL HIGH (ref 0–99)
NonHDL: 180.16
Total CHOL/HDL Ratio: 5
Triglycerides: 127 mg/dL (ref 0.0–149.0)
VLDL: 25.4 mg/dL (ref 0.0–40.0)

## 2015-07-19 NOTE — Progress Notes (Signed)
Subjective:    Patient ID: Jeffrey Spence, male    DOB: 13-Jul-1960, 55 y.o.   MRN: [id number]  HPI  Mr. Filar is a 55 year old male who presents today with multiple complaints.  1) Shoulder pain: Pain is located to his left shoulder and has been present for the past 6 months to 1 year. 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 His pain is most bothersome with abduction and posterior movement. He does not take any OTC medications for his shoulder pain. No increased pain or numbness/tingling.   2) Palpitations: His palpitations have been present for the past 2 nights around 5 am (works 12 hour night shifts). He's noticed a "pounding" heart beat. Denies tachycardia. Once he rested the sensation dissipated. Last night he was off from work, he was sitting on the couch around 5 am and noticed the pounding heart beat again. The sensation dissipated after a few seconds. Denies chest pain, shortness of breath, nausea, dizziness. He does endorse drinking caffeine such as [employer] and coffee and has done so for years. No recent increase in consumption.  Review of Systems  Respiratory: Negative for shortness of breath.   Cardiovascular: Positive for palpitations. Negative for chest pain.  Gastrointestinal: Negative for nausea.  Musculoskeletal: Positive for arthralgias.  Neurological: Positive for headaches. Negative for dizziness.       Past Medical History  Diagnosis Date  . Arthritis     Social History   Social History  . Marital Status: Married    Spouse Name: N/A  . Number of Children: 7  . Years of Education: N/A   Occupational History  .     Social History Main Topics  . Smoking status: Current Every Day Smoker -- 1.00 packs/day    Types: Cigarettes  . Smokeless tobacco: Never Used  . Alcohol Use: 0.0 oz/week    0 Standard drinks or equivalent per week     Comment: rare  . Drug Use: No  . Sexual Activity: Not on  file   Other Topics Concern  . Not on file   Social History Narrative   Regular exercise--yes    Past Surgical History  Procedure Laterality Date  . Fractured toe Right 2013    great toe/with pin    Family History  Problem Relation Age of Onset  . Arthritis Mother     ra  . Heart attack Father 56  . Cancer Father   . Cancer Paternal Uncle     leukemia  . Colon cancer Neg Hx     No Known Allergies  Current Outpatient Prescriptions on File Prior to Visit  Medication Sig Dispense Refill  . acetaminophen (TYLENOL) 500 MG tablet Take 1,000 mg by mouth every 6 (six) hours as needed.      . Multiple Vitamin (MULTIVITAMIN) tablet Take 1 tablet by mouth daily.    . vitamin B-12 (CYANOCOBALAMIN) 1000 MCG tablet Take 1,000 mcg by mouth daily.       No current facility-administered medications on file prior to visit.    BP 124/74 mmHg  Pulse 74  Temp(Src) 97.4 F (36.3 C) (Oral)  Ht 5' 7.75" (1.721 m)  Wt 171 lb (77.565 kg)  BMI 26.19 kg/m2  SpO2 97%    Objective:   Physical Exam  Constitutional: He appears well-nourished.  Cardiovascular: Normal rate and regular rhythm.   Pulmonary/Chest: Effort normal and breath sounds normal.  Skin:  Skin is warm and dry.          Assessment & Plan:  Palpitations:  Occurred over the past 2 days at 5 am. One episode during exertion, one at rest. Lasted several seconds. ECG unremarkable with NSR, good R wave progression, no ST elevation or depression.  Due to age and FH of MI, will obtain labs to rule out other causes of palpitations. Suspect this is likely due to caffeine consumption and discussed to reduce his intake. He is to notify myself or PCP if palpitations continue.  Shoulder Pain:  Chronic. Present for 1 year. Suspect injury to ligament due to repetitive motion from operating machines. Discussed RICE. Xray pending. Discussed also seeing Dr. Lorelei Pont as he may need further work up including MRI.

## 2015-07-19 NOTE — Progress Notes (Signed)
Pre visit review using our clinic review tool, if applicable. No additional management support is needed unless otherwise documented below in the visit note. 

## 2015-07-19 NOTE — Patient Instructions (Addendum)
Complete lab work prior to leaving today. I will notify you of your results once received.   Complete xray(s) prior to leaving today. I will notify you of your results once received.  Your ECG does not show any evidence of heart attack or damage. Please notify me if your palpitations continue.   I'll be in touch soon.  It was a pleasure meeting you!

## 2015-07-22 ENCOUNTER — Emergency Department: Payer: 59

## 2015-07-22 ENCOUNTER — Observation Stay: Admission: EM | Admit: 2015-07-22 | Discharge: 2015-07-23 | Payer: 59 | Attending: Specialist | Admitting: Specialist

## 2015-07-22 DIAGNOSIS — E785 Hyperlipidemia, unspecified: Secondary | ICD-10-CM | POA: Insufficient documentation

## 2015-07-22 DIAGNOSIS — Z8249 Family history of ischemic heart disease and other diseases of the circulatory system: Secondary | ICD-10-CM | POA: Diagnosis not present

## 2015-07-22 DIAGNOSIS — Z806 Family history of leukemia: Secondary | ICD-10-CM | POA: Diagnosis not present

## 2015-07-22 DIAGNOSIS — R109 Unspecified abdominal pain: Secondary | ICD-10-CM | POA: Diagnosis present

## 2015-07-22 DIAGNOSIS — Z8261 Family history of arthritis: Secondary | ICD-10-CM | POA: Insufficient documentation

## 2015-07-22 DIAGNOSIS — R0602 Shortness of breath: Secondary | ICD-10-CM | POA: Insufficient documentation

## 2015-07-22 DIAGNOSIS — I2510 Atherosclerotic heart disease of native coronary artery without angina pectoris: Secondary | ICD-10-CM | POA: Insufficient documentation

## 2015-07-22 DIAGNOSIS — M1990 Unspecified osteoarthritis, unspecified site: Secondary | ICD-10-CM | POA: Insufficient documentation

## 2015-07-22 DIAGNOSIS — I200 Unstable angina: Secondary | ICD-10-CM | POA: Insufficient documentation

## 2015-07-22 DIAGNOSIS — I249 Acute ischemic heart disease, unspecified: Secondary | ICD-10-CM | POA: Diagnosis present

## 2015-07-22 DIAGNOSIS — R61 Generalized hyperhidrosis: Secondary | ICD-10-CM | POA: Diagnosis not present

## 2015-07-22 DIAGNOSIS — R079 Chest pain, unspecified: Secondary | ICD-10-CM | POA: Diagnosis present

## 2015-07-22 DIAGNOSIS — F17210 Nicotine dependence, cigarettes, uncomplicated: Secondary | ICD-10-CM | POA: Insufficient documentation

## 2015-07-22 DIAGNOSIS — I214 Non-ST elevation (NSTEMI) myocardial infarction: Principal | ICD-10-CM | POA: Insufficient documentation

## 2015-07-22 DIAGNOSIS — F17200 Nicotine dependence, unspecified, uncomplicated: Secondary | ICD-10-CM | POA: Insufficient documentation

## 2015-07-22 DIAGNOSIS — Z809 Family history of malignant neoplasm, unspecified: Secondary | ICD-10-CM | POA: Insufficient documentation

## 2015-07-22 HISTORY — DX: Atherosclerotic heart disease of native coronary artery without angina pectoris: I25.10

## 2015-07-22 HISTORY — DX: Non-ST elevation (NSTEMI) myocardial infarction: I21.4

## 2015-07-22 HISTORY — DX: Hyperlipidemia, unspecified: E78.5

## 2015-07-22 HISTORY — DX: Tobacco use: Z72.0

## 2015-07-22 LAB — COMPREHENSIVE METABOLIC PANEL
ALT: 23 U/L (ref 17–63)
AST: 17 U/L (ref 15–41)
Albumin: 4.2 g/dL (ref 3.5–5.0)
Alkaline Phosphatase: 145 U/L — ABNORMAL HIGH (ref 38–126)
Anion gap: 7 (ref 5–15)
BUN: 21 mg/dL — ABNORMAL HIGH (ref 6–20)
CO2: 27 mmol/L (ref 22–32)
Calcium: 8.9 mg/dL (ref 8.9–10.3)
Chloride: 107 mmol/L (ref 101–111)
Creatinine, Ser: 1 mg/dL (ref 0.61–1.24)
GFR calc Af Amer: >60 mL/min (ref >60)
GFR calc non Af Amer: >60 mL/min (ref >60)
Glucose, Bld: 120 mg/dL — ABNORMAL HIGH (ref 65–99)
Potassium: 3.8 mmol/L (ref 3.5–5.1)
Sodium: 141 mmol/L (ref 135–145)
Total Bilirubin: 0.6 mg/dL (ref 0.3–1.2)
Total Protein: 7.5 g/dL (ref 6.5–8.1)

## 2015-07-22 LAB — CBC
HCT: 44.3 % (ref 40.0–52.0)
Hemoglobin: 15 g/dL (ref 13.0–18.0)
MCH: 30.8 pg (ref 26.0–34.0)
MCHC: 33.8 g/dL (ref 32.0–36.0)
MCV: 91.2 fL (ref 80.0–100.0)
Platelets: 263 10*3/uL (ref 150–440)
RBC: 4.85 MIL/uL (ref 4.40–5.90)
RDW: 13.4 % (ref 11.5–14.5)
WBC: 11.2 10*3/uL — ABNORMAL HIGH (ref 3.8–10.6)

## 2015-07-22 LAB — TROPONIN I: Troponin I: 0.07 ng/mL — ABNORMAL HIGH (ref <0.031)

## 2015-07-22 MED ORDER — ONDANSETRON HCL 4 MG/2ML IJ SOLN
4.0000 mg | Freq: Once | INTRAMUSCULAR | Status: AC
  Administered 2015-07-23: 4 mg via INTRAVENOUS
  Filled 2015-07-22: qty 2

## 2015-07-22 MED ORDER — MORPHINE SULFATE (PF) 2 MG/ML IV SOLN
2.0000 mg | Freq: Once | INTRAVENOUS | Status: AC
  Administered 2015-07-23: 2 mg via INTRAVENOUS
  Filled 2015-07-22: qty 1

## 2015-07-22 NOTE — ED Notes (Signed)
(+)   Troponin result called to this RN; result 0.07. Patient in triage. Charge nurse made aware.

## 2015-07-22 NOTE — ED Provider Notes (Signed)
[facility] Emergency Department Provider Note  ____________________________________________  Time seen: Approximately 11:34 PM  I have reviewed the triage vital signs and the nursing notes.   HISTORY  Chief Complaint Chest Pain    HPI Jeffrey Spence is a 55 y.o. male who comes into tonight with chest congestion constriction and arm pain. The patient reports thathe was walking around at 7:30 when he started having the pain. The patient called 911 and reports that they told him to chew an aspirin and he decided to come in to get checked out. The patient has had similar pain Thursday Friday and Saturday morning. He reports that the first time he was at work and it took a little time before the pain went away. He reports that the next 2 times that he was at home she was able to find a comfortable position and the pain subsided. He reports that this pain has occurred around 5 AM each time. The first time and this time he reports that he did feel clammy and have some dry mouth with some shortness of breath but he denies any nausea or vomiting. The patient reports that currently his arm pain is a 4 out of 10 in intensity and it goes from his armpit to his elbow but he reports that the chest discomfort and tightness is improved but was in the middle of his chest. The patient reports that at this time he just feels so drained and he is unsure what going on so he decided to come in and get checked out.   Past Medical History  Diagnosis Date  . Arthritis     Patient Active Problem List   Diagnosis Date Noted  . Chest pain 07/22/2015  . Swelling of arm 01/20/2013  . Routine general medical examination at a health care facility 01/12/2013  . OA (osteoarthritis) of knee 01/12/2013  . TESTICULAR HYPOFUNCTION 08/14/2010  . TOBACCO ABUSE 08/12/2010    Past Surgical History  Procedure Laterality Date  . Fractured toe Right 2013    great toe/with pin    No current  outpatient prescriptions on file.  Allergies Review of patient's allergies indicates no known allergies.  Family History  Problem Relation Age of Onset  . Arthritis Mother     ra  . Heart attack Father 55  . Cancer Father   . Cancer Paternal Uncle     leukemia  . Colon cancer Neg Hx     Social History Social History  Substance Use Topics  . Smoking status: Current Every Day Smoker -- 1.00 packs/day    Types: Cigarettes  . Smokeless tobacco: Never Used  . Alcohol Use: 0.0 oz/week    0 Standard drinks or equivalent per week     Comment: rare    Review of Systems Constitutional: No fever/chills Eyes: No visual changes. ENT: No sore throat. Cardiovascular:  chest pain. Respiratory:  shortness of breath. Gastrointestinal: No abdominal pain.  No nausea, no vomiting.  No diarrhea.  No constipation. Genitourinary: Negative for dysuria. Musculoskeletal: Negative for back pain. Skin: Negative for rash. Neurological: Negative for headaches, focal weakness or numbness.  10-point ROS otherwise negative.  ____________________________________________   PHYSICAL EXAM:  VITAL SIGNS: ED Triage Vitals  Enc Vitals Group     BP 07/22/15 2128 141/86 mmHg     Pulse Rate 07/22/15 2128 78     Resp 07/22/15 2128 16     Temp 07/22/15 2128 97.4 F (36.3 C)  Temp Source 07/22/15 2128 Oral     SpO2 07/22/15 2128 98 %     Weight 07/22/15 2128 171 lb (77.565 kg)     Height 07/22/15 2128 5\' 8"  (1.727 m)     Head Cir --      Peak Flow --      Pain Score 07/22/15 2130 5     Pain Loc --      Pain Edu? --      Excl. in [place]? --     Constitutional: Alert and oriented. Well appearing and in mild distress. Eyes: Conjunctivae are normal. PERRL. EOMI. Head: Atraumatic. Nose: No congestion/rhinnorhea. Mouth/Throat: Mucous membranes are moist.  Oropharynx non-erythematous. Cardiovascular: Normal rate, regular rhythm. Grossly normal heart sounds.  Good peripheral circulation. Respiratory:  Normal respiratory effort.  No retractions. Lungs CTAB. Gastrointestinal: Soft and nontender. No distention. Positive bowel sounds Musculoskeletal: No lower extremity tenderness nor edema.  Neurologic:  Normal speech and language.  Skin:  Skin is warm, dry and intact.  Psychiatric: Mood and affect are normal.   ____________________________________________   LABS (all labs ordered are listed, but only abnormal results are displayed)  Labs Reviewed  CBC - Abnormal; Notable for the following:    WBC 11.2 (*)    All other components within normal limits  COMPREHENSIVE METABOLIC PANEL - Abnormal; Notable for the following:    Glucose, Bld 120 (*)    BUN 21 (*)    Alkaline Phosphatase 145 (*)    All other components within normal limits  TROPONIN I - Abnormal; Notable for the following:    Troponin I 0.07 (*)    All other components within normal limits   ____________________________________________  EKG  ED ECG REPORT I, Loney Hering, the attending physician, personally viewed and interpreted this ECG.   Date: 07/22/2015  EKG Time: 2133  Rate: 79  Rhythm: normal sinus rhythm  Axis: normal  Intervals:none  ST&T Change: none  ____________________________________________  RADIOLOGY  Chest x-ray: No acute cardiopulmonary abnormalities ____________________________________________   PROCEDURES  Procedure(s) performed: None  Critical Care performed: No  ____________________________________________   INITIAL IMPRESSION / ASSESSMENT AND PLAN / ED COURSE  Pertinent labs & imaging results that were available during my care of the patient were reviewed by me and considered in my medical decision making (see chart for details).  This is a 55 year old male who comes into the hospital today with some chest discomfort and arm pain. The patient reports that her pain has been coming multiple times in the past few days and is very concerning. The patient had blood work  initially to evaluate his heart and his troponin is mildly positive at 0.07. The patient's chest x-ray is unremarkable but given the patient's symptoms and his elevated troponin I will admit him to the hospitalist service. The patient did take some aspirin at home and at this time we will hold off on anticoagulation. I will give the patient a dose of morphine 2 mg IV 1 dose as well as Zofran to help take his arm pain down to a 0. I discussed the results with the patient and he has no further questions or concerns at this time. ____________________________________________   FINAL CLINICAL IMPRESSION(S) / ED DIAGNOSES  Final diagnoses:  Chest pain, unspecified chest pain type  Acute coronary syndrome (HCC)      Loney Hering, MD 07/23/15 0000

## 2015-07-22 NOTE — ED Notes (Signed)
Pt arrived to ED with c/o tightness in chest while at work this evening. Pt reports shortness of breath. Pt rates pain 5/10 at this time.

## 2015-07-23 ENCOUNTER — Observation Stay: Payer: 59

## 2015-07-23 ENCOUNTER — Encounter
Admission: EM | Disposition: A | Discharge status: Other Acute Inpatient Hospital | Payer: Self-pay | Source: Home / Self Care | Attending: Internal Medicine

## 2015-07-23 ENCOUNTER — Inpatient Hospital Stay (HOSPITAL_COMMUNITY)
Admission: AD | Admit: 2015-07-23 | Discharge: 2015-07-31 | DRG: 236 | Disposition: A | Discharge status: Home / Self Care | Payer: 59 | Source: Other Acute Inpatient Hospital | Attending: Thoracic Surgery (Cardiothoracic Vascular Surgery) | Admitting: Thoracic Surgery (Cardiothoracic Vascular Surgery)

## 2015-07-23 ENCOUNTER — Encounter: Payer: Self-pay | Admitting: Radiology

## 2015-07-23 DIAGNOSIS — I25110 Atherosclerotic heart disease of native coronary artery with unstable angina pectoris: Secondary | ICD-10-CM | POA: Insufficient documentation

## 2015-07-23 DIAGNOSIS — F17200 Nicotine dependence, unspecified, uncomplicated: Secondary | ICD-10-CM | POA: Insufficient documentation

## 2015-07-23 DIAGNOSIS — E785 Hyperlipidemia, unspecified: Secondary | ICD-10-CM | POA: Diagnosis present

## 2015-07-23 DIAGNOSIS — I2510 Atherosclerotic heart disease of native coronary artery without angina pectoris: Secondary | ICD-10-CM | POA: Diagnosis present

## 2015-07-23 DIAGNOSIS — I472 Ventricular tachycardia: Secondary | ICD-10-CM | POA: Diagnosis not present

## 2015-07-23 DIAGNOSIS — E8770 Fluid overload, unspecified: Secondary | ICD-10-CM | POA: Diagnosis not present

## 2015-07-23 DIAGNOSIS — Z720 Tobacco use: Secondary | ICD-10-CM

## 2015-07-23 DIAGNOSIS — D62 Acute posthemorrhagic anemia: Secondary | ICD-10-CM | POA: Diagnosis not present

## 2015-07-23 DIAGNOSIS — K824 Cholesterolosis of gallbladder: Secondary | ICD-10-CM | POA: Diagnosis present

## 2015-07-23 DIAGNOSIS — Z8249 Family history of ischemic heart disease and other diseases of the circulatory system: Secondary | ICD-10-CM

## 2015-07-23 DIAGNOSIS — I480 Paroxysmal atrial fibrillation: Secondary | ICD-10-CM | POA: Diagnosis not present

## 2015-07-23 DIAGNOSIS — I214 Non-ST elevation (NSTEMI) myocardial infarction: Secondary | ICD-10-CM | POA: Diagnosis present

## 2015-07-23 DIAGNOSIS — I249 Acute ischemic heart disease, unspecified: Secondary | ICD-10-CM | POA: Insufficient documentation

## 2015-07-23 DIAGNOSIS — I200 Unstable angina: Secondary | ICD-10-CM | POA: Insufficient documentation

## 2015-07-23 DIAGNOSIS — J9811 Atelectasis: Secondary | ICD-10-CM | POA: Diagnosis not present

## 2015-07-23 DIAGNOSIS — F17210 Nicotine dependence, cigarettes, uncomplicated: Secondary | ICD-10-CM | POA: Diagnosis present

## 2015-07-23 DIAGNOSIS — Z951 Presence of aortocoronary bypass graft: Secondary | ICD-10-CM

## 2015-07-23 DIAGNOSIS — Z9689 Presence of other specified functional implants: Secondary | ICD-10-CM

## 2015-07-23 DIAGNOSIS — R079 Chest pain, unspecified: Secondary | ICD-10-CM | POA: Diagnosis not present

## 2015-07-23 HISTORY — PX: CARDIAC CATHETERIZATION: SHX172

## 2015-07-23 LAB — BASIC METABOLIC PANEL
Anion gap: 12 (ref 5–15)
BUN: 10 mg/dL (ref 6–20)
CO2: 23 mmol/L (ref 22–32)
Calcium: 8.8 mg/dL — ABNORMAL LOW (ref 8.9–10.3)
Chloride: 106 mmol/L (ref 101–111)
Creatinine, Ser: 0.88 mg/dL (ref 0.61–1.24)
GFR calc Af Amer: >60 mL/min (ref >60)
GFR calc non Af Amer: >60 mL/min (ref >60)
Glucose, Bld: 88 mg/dL (ref 65–99)
Potassium: 3.9 mmol/L (ref 3.5–5.1)
Sodium: 141 mmol/L (ref 135–145)

## 2015-07-23 LAB — CBC WITH DIFFERENTIAL/PLATELET
Basophils Absolute: 0 10*3/uL (ref 0.0–0.1)
Basophils Relative: 0 %
Eosinophils Absolute: 0.1 10*3/uL (ref 0.0–0.7)
Eosinophils Relative: 2 %
HCT: 41.3 % (ref 39.0–52.0)
Hemoglobin: 13.9 g/dL (ref 13.0–17.0)
Lymphocytes Relative: 30 %
Lymphs Abs: 2.2 10*3/uL (ref 0.7–4.0)
MCH: 31.2 pg (ref 26.0–34.0)
MCHC: 33.7 g/dL (ref 30.0–36.0)
MCV: 92.8 fL (ref 78.0–100.0)
Monocytes Absolute: 0.7 10*3/uL (ref 0.1–1.0)
Monocytes Relative: 10 %
Neutro Abs: 4.3 10*3/uL (ref 1.7–7.7)
Neutrophils Relative %: 58 %
Platelets: 269 10*3/uL (ref 150–400)
RBC: 4.45 MIL/uL (ref 4.22–5.81)
RDW: 13.5 % (ref 11.5–15.5)
WBC: 7.4 10*3/uL (ref 4.0–10.5)

## 2015-07-23 LAB — BRAIN NATRIURETIC PEPTIDE: B Natriuretic Peptide: 76 pg/mL (ref 0.0–100.0)

## 2015-07-23 LAB — HEMOGLOBIN A1C: Hgb A1c MFr Bld: 5.5 % (ref 4.0–6.0)

## 2015-07-23 LAB — MRSA PCR SCREENING: MRSA by PCR: NEGATIVE

## 2015-07-23 LAB — APTT: aPTT: 31 seconds (ref 24–36)

## 2015-07-23 LAB — PROTIME-INR
INR: 0.93
Prothrombin Time: 12.7 seconds (ref 11.4–15.0)

## 2015-07-23 LAB — TROPONIN I
Troponin I: 1.49 ng/mL — ABNORMAL HIGH (ref <0.031)
Troponin I: 1.63 ng/mL — ABNORMAL HIGH (ref <0.031)

## 2015-07-23 LAB — TSH: TSH: 1.91 u[IU]/mL (ref 0.350–4.500)

## 2015-07-23 SURGERY — LEFT HEART CATH AND CORONARY ANGIOGRAPHY
Anesthesia: Moderate Sedation | Laterality: Bilateral

## 2015-07-23 MED ORDER — IOHEXOL 300 MG/ML  SOLN
100.0000 mL | Freq: Once | INTRAMUSCULAR | Status: AC | PRN
  Administered 2015-07-23: 100 mL via INTRAVENOUS

## 2015-07-23 MED ORDER — SODIUM CHLORIDE 0.9 % IV SOLN
INTRAVENOUS | Status: DC
  Administered 2015-07-23: 10:00:00 via INTRAVENOUS

## 2015-07-23 MED ORDER — HEPARIN (PORCINE) IN NACL 2-0.9 UNIT/ML-% IJ SOLN
INTRAMUSCULAR | Status: AC
  Filled 2015-07-23: qty 1000

## 2015-07-23 MED ORDER — HEPARIN BOLUS VIA INFUSION
4000.0000 [IU] | Freq: Once | INTRAVENOUS | Status: AC
  Administered 2015-07-23: 4000 [IU] via INTRAVENOUS
  Filled 2015-07-23: qty 4000

## 2015-07-23 MED ORDER — ATORVASTATIN CALCIUM 40 MG PO TABS
40.0000 mg | ORAL_TABLET | Freq: Every day | ORAL | Status: DC
  Administered 2015-07-23 – 2015-07-28 (×6): 40 mg via ORAL
  Filled 2015-07-23 (×6): qty 1

## 2015-07-23 MED ORDER — SODIUM CHLORIDE 0.9 % WEIGHT BASED INFUSION: 3.0000 mL/kg/h | INTRAVENOUS | Status: DC

## 2015-07-23 MED ORDER — FENTANYL CITRATE (PF) 100 MCG/2ML IJ SOLN
INTRAMUSCULAR | Status: AC
  Filled 2015-07-23: qty 2

## 2015-07-23 MED ORDER — ASPIRIN EC 81 MG PO TBEC
81.0000 mg | DELAYED_RELEASE_TABLET | Freq: Every day | ORAL | Status: DC
  Administered 2015-07-24: 81 mg via ORAL
  Filled 2015-07-23: qty 1

## 2015-07-23 MED ORDER — HEPARIN (PORCINE) IN NACL 100-0.45 UNIT/ML-% IJ SOLN
1300.0000 [IU]/h | INTRAMUSCULAR | Status: DC
  Administered 2015-07-23: 950 [IU]/h via INTRAVENOUS
  Administered 2015-07-24: 1300 [IU]/h via INTRAVENOUS
  Filled 2015-07-23 (×4): qty 250

## 2015-07-23 MED ORDER — ACETAMINOPHEN 325 MG PO TABS: 650.0000 mg | ORAL_TABLET | ORAL | Status: DC | PRN

## 2015-07-23 MED ORDER — HEPARIN SODIUM (PORCINE) 5000 UNIT/ML IJ SOLN
5000.0000 [IU] | Freq: Three times a day (TID) | INTRAMUSCULAR | Status: DC
  Administered 2015-07-23: 5000 [IU] via SUBCUTANEOUS
  Filled 2015-07-23: qty 1

## 2015-07-23 MED ORDER — ASPIRIN 81 MG PO CHEW
81.0000 mg | CHEWABLE_TABLET | ORAL | Status: DC
Start: 2015-07-24 — End: 2015-07-23

## 2015-07-23 MED ORDER — SODIUM CHLORIDE 0.9 % WEIGHT BASED INFUSION: 1.0000 mL/kg/h | INTRAVENOUS | Status: DC

## 2015-07-23 MED ORDER — NITROGLYCERIN 5 MG/ML IV SOLN
INTRAVENOUS | Status: AC
  Filled 2015-07-23: qty 10

## 2015-07-23 MED ORDER — MIDAZOLAM HCL 2 MG/2ML IJ SOLN
INTRAMUSCULAR | Status: AC
  Filled 2015-07-23: qty 2

## 2015-07-23 MED ORDER — ONDANSETRON HCL 4 MG/2ML IJ SOLN
4.0000 mg | Freq: Four times a day (QID) | INTRAMUSCULAR | Status: DC | PRN
Start: 2015-07-23 — End: 2015-07-25

## 2015-07-23 MED ORDER — ONDANSETRON HCL 4 MG/2ML IJ SOLN: 4.0000 mg | Freq: Four times a day (QID) | INTRAMUSCULAR | Status: DC | PRN

## 2015-07-23 MED ORDER — NICOTINE 21 MG/24HR TD PT24
21.0000 mg | MEDICATED_PATCH | Freq: Every day | TRANSDERMAL | Status: DC
  Administered 2015-07-23: 21 mg via TRANSDERMAL
  Filled 2015-07-23: qty 1

## 2015-07-23 MED ORDER — HEPARIN (PORCINE) IN NACL 100-0.45 UNIT/ML-% IJ SOLN
950.0000 [IU]/h | INTRAMUSCULAR | Status: DC
  Administered 2015-07-23: 950 [IU]/h via INTRAVENOUS
  Filled 2015-07-23: qty 250

## 2015-07-23 MED ORDER — NICOTINE 21 MG/24HR TD PT24
21.0000 mg | MEDICATED_PATCH | Freq: Every day | TRANSDERMAL | Status: DC
  Administered 2015-07-24: 21 mg via TRANSDERMAL
  Filled 2015-07-23: qty 1

## 2015-07-23 MED ORDER — NITROGLYCERIN 2 % TD OINT
0.5000 [in_us] | TOPICAL_OINTMENT | Freq: Four times a day (QID) | TRANSDERMAL | Status: DC
  Administered 2015-07-23 – 2015-07-25 (×7): 0.5 [in_us] via TOPICAL
  Filled 2015-07-23: qty 30

## 2015-07-23 MED ORDER — ONDANSETRON HCL 4 MG PO TABS: 4.0000 mg | ORAL_TABLET | Freq: Four times a day (QID) | ORAL | Status: DC | PRN

## 2015-07-23 MED ORDER — DOCUSATE SODIUM 100 MG PO CAPS
100.0000 mg | ORAL_CAPSULE | Freq: Two times a day (BID) | ORAL | Status: DC
  Administered 2015-07-23 – 2015-07-24 (×3): 100 mg via ORAL
  Filled 2015-07-23 (×3): qty 1

## 2015-07-23 MED ORDER — DOCUSATE SODIUM 100 MG PO CAPS
100.0000 mg | ORAL_CAPSULE | Freq: Two times a day (BID) | ORAL | Status: DC
  Administered 2015-07-23: 100 mg via ORAL
  Filled 2015-07-23 (×2): qty 1

## 2015-07-23 MED ORDER — NITROGLYCERIN 0.4 MG SL SUBL: 0.4000 mg | SUBLINGUAL_TABLET | SUBLINGUAL | Status: DC | PRN

## 2015-07-23 MED ORDER — MIDAZOLAM HCL 2 MG/2ML IJ SOLN
INTRAMUSCULAR | Status: DC | PRN
  Administered 2015-07-23 (×2): 1 mg via INTRAVENOUS

## 2015-07-23 MED ORDER — SODIUM CHLORIDE 0.9% FLUSH
3.0000 mL | Freq: Two times a day (BID) | INTRAVENOUS | Status: DC
  Administered 2015-07-23 (×2): 3 mL via INTRAVENOUS

## 2015-07-23 MED ORDER — HEPARIN (PORCINE) IN NACL 100-0.45 UNIT/ML-% IJ SOLN: 950.0000 [IU]/h | INTRAMUSCULAR | Status: DC

## 2015-07-23 MED ORDER — IOHEXOL 240 MG/ML SOLN
25.0000 mL | INTRAMUSCULAR | Status: AC
  Administered 2015-07-23 (×2): 25 mL via ORAL

## 2015-07-23 MED ORDER — NICOTINE 21 MG/24HR TD PT24: 21.0000 mg | MEDICATED_PATCH | Freq: Every day | TRANSDERMAL | Status: DC

## 2015-07-23 MED ORDER — ACETAMINOPHEN 325 MG PO TABS: 650.0000 mg | ORAL_TABLET | Freq: Four times a day (QID) | ORAL | Status: DC | PRN

## 2015-07-23 MED ORDER — ACETAMINOPHEN 650 MG RE SUPP: 650.0000 mg | Freq: Four times a day (QID) | RECTAL | Status: DC | PRN

## 2015-07-23 MED ORDER — NITROGLYCERIN 2 % TD OINT
0.5000 [in_us] | TOPICAL_OINTMENT | Freq: Four times a day (QID) | TRANSDERMAL | Status: DC
  Administered 2015-07-23 (×2): 0.5 [in_us] via TOPICAL
  Filled 2015-07-23 (×2): qty 1

## 2015-07-23 MED ORDER — FENTANYL CITRATE (PF) 100 MCG/2ML IJ SOLN
INTRAMUSCULAR | Status: DC | PRN
  Administered 2015-07-23: 50 ug via INTRAVENOUS

## 2015-07-23 MED ORDER — ASPIRIN EC 81 MG PO TBEC
81.0000 mg | DELAYED_RELEASE_TABLET | Freq: Every day | ORAL | Status: DC
  Administered 2015-07-23: 81 mg via ORAL
  Filled 2015-07-23: qty 1

## 2015-07-23 MED ORDER — MORPHINE SULFATE (PF) 2 MG/ML IV SOLN: 2.0000 mg | INTRAVENOUS | Status: DC | PRN

## 2015-07-23 MED ORDER — ACETAMINOPHEN 325 MG PO TABS
650.0000 mg | ORAL_TABLET | ORAL | Status: DC | PRN
  Administered 2015-07-24: 650 mg via ORAL
  Filled 2015-07-23: qty 2

## 2015-07-23 MED ORDER — ASPIRIN 81 MG PO CHEW: 81.0000 mg | CHEWABLE_TABLET | ORAL | Status: DC

## 2015-07-23 MED ORDER — IOHEXOL 300 MG/ML  SOLN
INTRAMUSCULAR | Status: DC | PRN
  Administered 2015-07-23: 110 mL via INTRA_ARTERIAL

## 2015-07-23 MED ORDER — CARVEDILOL 3.125 MG PO TABS
3.1250 mg | ORAL_TABLET | Freq: Two times a day (BID) | ORAL | Status: DC
  Administered 2015-07-23 – 2015-07-24 (×2): 3.125 mg via ORAL
  Filled 2015-07-23 (×2): qty 1

## 2015-07-23 SURGICAL SUPPLY — 9 items
CATH INFINITI 5FR ANG PIGTAIL (CATHETERS) ×2 IMPLANT
CATH INFINITI 5FR JL4 (CATHETERS) ×2 IMPLANT
CATH INFINITI JR4 5F (CATHETERS) ×2 IMPLANT
DEVICE CLOSURE MYNXGRIP 5F (Vascular Products) ×2 IMPLANT
KIT MANI 3VAL PERCEP (MISCELLANEOUS) ×2 IMPLANT
NEEDLE PERC 18GX7CM (NEEDLE) ×2 IMPLANT
PACK CARDIAC CATH (CUSTOM PROCEDURE TRAY) ×2 IMPLANT
SHEATH AVANTI 5FR X 11CM (SHEATH) ×2 IMPLANT
WIRE EMERALD 3MM-J .035X150CM (WIRE) ×2 IMPLANT

## 2015-07-23 NOTE — Consult Note (Signed)
Cardiology Consultation Note  Patient ID: Jeffrey Spence, MRN: [id number], DOB/AGE: 01-28-1961 55 y.o. Admit date: 07/22/2015   Date of Consult: 07/23/2015 Primary Physician: Arnette Norris, MD Primary Cardiologist: New to [facility]  Chief Complaint: Chest pain Reason for Consult: NSTEMI  HPI: 55 y.o. male with h/o ongoing tobacco abuse and HLD who presents for chest pain and was found to have a NSTEMI.   He does not have any previously known cardiac history. He any known history of diabetes or HTN. He has smoked 1 pack of cigarettes daily for 36 years and continues to smoke to date. He has been having stuttering chest pain since 1/26 while at work with each episode lasting approximately one hour before self resolving. Just after his first episode he called his PCP for an appointment, which was scheduled for 1/27. At that time he had a non-acute ECG and unremarkable labs outside of LDL of 155. He then felt ok for 1/28-1/29. 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 There was some associated nausea and diaphoresis.   Upon the patient's arrival to [facility] they were found to have a troponin of 0.07-->1.49, WBC 11.2, SCr 1.00. ECG as below, CXR showed no acute abnormality. CT abnormality showed no acute process with 6 mm gallbladder polyp. He was started on a heparin gtt and is currently chest pain free.   Past Medical History  Diagnosis Date  . Arthritis       Most Recent Cardiac Studies: none   Surgical History:  Past Surgical History  Procedure Laterality Date  . Fractured toe Right 2013    great toe/with pin     Home Meds: Prior to Admission medications   Not on File    Inpatient Medications:  . aspirin EC  81 mg Oral Daily  . docusate sodium  100 mg Oral BID  . nicotine  21 mg Transdermal Daily  . nitroGLYCERIN  0.5 inch Topical 4 times per day  . sodium chloride flush  3 mL  Intravenous Q12H   . heparin 950 Units/hr (07/23/15 0732)    Allergies: No Known Allergies  Social History   Social History  . Marital Status: Married    Spouse Name: N/A  . Number of Children: 7  . Years of Education: N/A   Occupational History  .     Social History Main Topics  . Smoking status: Current Every Day Smoker -- 1.00 packs/day    Types: Cigarettes  . Smokeless tobacco: Never Used  . Alcohol Use: 0.0 oz/week    0 Standard drinks or equivalent per week     Comment: rare  . Drug Use: No  . Sexual Activity: Not on file   Other Topics Concern  . Not on file   Social History Narrative   Regular exercise--yes     Family History  Problem Relation Age of Onset  . Arthritis Mother     ra  . Heart attack Father 72  . Cancer Father   . Cancer Paternal Uncle     leukemia  . Colon cancer Neg Hx      Review of Systems: Review of Systems  Constitutional: Positive for malaise/fatigue and diaphoresis. Negative for fever, chills and weight loss.  HENT: Negative for congestion.   Eyes: Negative for discharge and redness.  Respiratory: Negative for cough, hemoptysis, sputum production, shortness of breath and  wheezing.   Cardiovascular: Positive for chest pain. Negative for palpitations, orthopnea, claudication, leg swelling and PND.  Gastrointestinal: Positive for nausea. Negative for heartburn, vomiting and abdominal pain.  Musculoskeletal: Negative for falls.  Skin: Negative for rash.  Neurological: Positive for weakness. Negative for dizziness, sensory change, speech change, focal weakness and loss of consciousness.  Endo/Heme/Allergies: Does not bruise/bleed easily.  Psychiatric/Behavioral: Positive for substance abuse. The patient is not nervous/anxious.        Ongoing tobacco abuse, no etoh or illegal drugs  All other systems reviewed and are negative.   Labs:  Recent Labs  07/22/15 2139 07/23/15 0229  TROPONINI 0.07* 1.49*   Lab Results    Component Value Date   WBC 11.2* 07/22/2015   HGB 15.0 07/22/2015   HCT 44.3 07/22/2015   MCV 91.2 07/22/2015   PLT 263 07/22/2015    Recent Labs Lab 07/22/15 2139  NA 141  K 3.8  CL 107  CO2 27  BUN 21*  CREATININE 1.00  CALCIUM 8.9  PROT 7.5  BILITOT 0.6  ALKPHOS 145*  ALT 23  AST 17  GLUCOSE 120*   Lab Results  Component Value Date   CHOL 223* 07/19/2015   HDL 42.90 07/19/2015   LDLCALC 155* 07/19/2015   TRIG 127.0 07/19/2015   No results found for: DDIMER  Radiology/Studies:  Dg Chest 2 View  07/22/2015  CLINICAL DATA:  Tightness in chest EXAM: CHEST  2 VIEW COMPARISON:  07/22/2015 FINDINGS: Heart size is normal. No pleural effusion or edema identified. No airspace consolidation. The visualized bony structures are normal. IMPRESSION: 1. No acute cardiopulmonary abnormalities. Electronically Signed   By: Kerby Moors M.D.   On: 07/22/2015 22:10   Ct Abdomen Pelvis W Contrast  07/23/2015  CLINICAL DATA:  Abdominal pain and bloating EXAM: CT ABDOMEN AND PELVIS WITH CONTRAST TECHNIQUE: Multidetector CT imaging of the abdomen and pelvis was performed using the standard protocol following bolus administration of intravenous contrast. CONTRAST:  133mL OMNIPAQUE IOHEXOL 300 MG/ML  SOLN COMPARISON:  None. FINDINGS: Lower chest and abdominal wall: Fatty umbilical hernia, small. Fatty bilateral inguinal hernia, likely both direct and indirect. Extensive coronary atherosclerotic calcification for age Mild dependent atelectasis Hepatobiliary: No focal liver abnormality.Layering calcified cholelithiasis. There is also an anti dependent nodule which does not appear calcified and is likely a 90mm polyp. No evidence of acute cholecystitis. Pancreas: Unremarkable. Spleen: Unremarkable. Adrenals/Urinary Tract: Negative adrenals. No hydronephrosis or stone. Tiny left renal cyst. Unremarkable bladder. Reproductive:No pathologic findings. Stomach/Bowel:  No obstruction. No appendicitis.  Vascular/Lymphatic: No acute vascular abnormality. No mass or adenopathy. Peritoneal: No ascites or pneumoperitoneum. Musculoskeletal: No acute abnormalities. IMPRESSION: 1. No acute finding. 2. Cholelithiasis and probable 6 mm gallbladder polyp. 3. Extensive coronary atherosclerosis. 4. Fatty inguinal and umbilical hernias. Electronically Signed   By: Monte Fantasia M.D.   On: 07/23/2015 04:35   Dg Shoulder Left  07/19/2015  CLINICAL DATA:  Left shoulder pain EXAM: LEFT SHOULDER - 2+ VIEW COMPARISON:  None. FINDINGS: Three views of the left shoulder submitted. No acute fracture or subluxation. No radiopaque foreign body. IMPRESSION: Negative. Electronically Signed   By: Lahoma Crocker M.D.   On: 07/19/2015 16:27    EKG: NSR, 79 bpm, slight anterior st elevation without reciprocal changes  Weights: [facility] Weights   07/22/15 2128 07/23/15 0039  Weight: 171 lb (77.565 kg) 168 lb 14.4 oz (76.613 kg)     Physical Exam: Blood pressure 106/64, pulse 77, temperature 97.8 F (36.6 C), temperature  source Oral, resp. rate 18, height 5\' 8"  (1.727 m), weight 168 lb 14.4 oz (76.613 kg), SpO2 99 %. Body mass index is 25.69 kg/(m^2). General: Well developed, well nourished, in no acute distress. Head: Normocephalic, atraumatic, sclera non-icteric, no xanthomas, nares are without discharge.  Neck: Negative for carotid bruits. JVD not elevated. Lungs: Clear bilaterally to auscultation without wheezes, rales, or rhonchi. Breathing is unlabored. Heart: RRR with S1 S2. No murmurs, rubs, or gallops appreciated. Abdomen: Soft, non-tender, non-distended with normoactive bowel sounds. No hepatomegaly. No rebound/guarding. No obvious abdominal masses. Msk:  Strength and tone appear normal for age. Extremities: No clubbing or cyanosis. No edema.  Distal pedal pulses are 2+ and equal bilaterally. Neuro: Alert and oriented X 3. No facial asymmetry. No focal deficit. Moves all extremities spontaneously. Psych:  Responds  to questions appropriately with a normal affect.    Assessment and Plan:   1. NSTEMI: -Schedule cardiac cath today -Heparin gtt -Aspirin  -Currently chest pain free -Discontinue nitro paste post cath, as BP climbs add beta blocker  2. HLD: -Lipitor 40 mg   3. Tobacco abuse: -Cessation advised  Melvern Banker, PA-C Pager: [phone] 07/23/2015, 9:10 AM

## 2015-07-23 NOTE — Progress Notes (Signed)
Patient is alert and oriented. Reporting mild headache since return from procedure, refused medication at this time, cool washcloth and repositioning and will reassess. Right groin WNL, pulses WNL. VSS. To be transferred to [place], report called to [place].

## 2015-07-23 NOTE — Progress Notes (Signed)
ANTICOAGULATION CONSULT NOTE - Initial Consult  Pharmacy Consult for heparin Indication: chest pain/ACS  No Known Allergies  Patient Measurements: Height: 5\' 8"  (172.7 cm) Weight: 168 lb 14.4 oz (76.613 kg) IBW/kg (Calculated) : 68.4 Heparin Dosing Weight: 77.6 kg  Vital Signs: Temp: 97.8 F (36.6 C) (01/31 0421) Temp Source: Oral (01/31 0421) BP: 119/82 mmHg (01/31 0421) Pulse Rate: 77 (01/31 0421)  Labs:  Recent Labs  07/22/15 2139 07/23/15 0229  HGB 15.0  --   HCT 44.3  --   PLT 263  --   CREATININE 1.00  --   TROPONINI 0.07* 1.49*    Estimated Creatinine Clearance: 81.7 mL/min (by C-G formula based on Cr of 1).   Medical History: Past Medical History  Diagnosis Date  . Arthritis     Medications:  Infusions:  . heparin      Assessment: 54 yom cc CP and positive troponin, pharmacy consulted to dose heparin for ACS. Patient received 1 dose of heparin 5000 units subcutaneously this morning at 0128. Waiting for APTT and INR before sending dose.    Goal of Therapy:  Heparin level 0.3-0.7 units/ml Monitor platelets by anticoagulation protocol: Yes   Plan:  Give 4000 units bolus x 1 Start heparin infusion at 950 units/hr Check anti-Xa level in 6 hours and daily while on heparin Continue to monitor H&H and platelets  Laural Benes, Pharm.D., BCPS Clinical Pharmacist 07/23/2015,6:26 AM

## 2015-07-23 NOTE — H&P (Signed)
Jeffrey Spence is an 55 y.o. male.   Chief Complaint: Chest pain HPI: The patient presents to the emergency department after multiple episodes of chest pain. He states that his symptoms began tonight at rest. The pain was substernal and radiated to his left armpit down to his elbow. He describes his arm is feeling numb and his chest as feeling as if a heavy pressure or on his heart. He admits to associated diaphoresis and shortness of breath. This is not the first time he has expressed chest pain. 2 days ago he experienced pain at work while carrying no more than 5 pounds a very short distance. He is used to carrying much heavier weight without any dyspnea or chest pain. He was evaluated at his primary care doctor's office the day after his initial chest pain. He reports normal EKG findings and no abnormalities with his blood work. Due to the return of his chest pain the patient presents emergency department for evaluation. Initial troponin was mildly elevated which prompted the emergency department staff to assess for admission.  Past Medical History  Diagnosis Date  . Arthritis     Past Surgical History  Procedure Laterality Date  . Fractured toe Right 2013    great toe/with pin    Family History  Problem Relation Age of Onset  . Arthritis Mother     ra  . Heart attack Father 97  . Cancer Father   . Cancer Paternal Uncle     leukemia  . Colon cancer Neg Hx    Social History:  reports that he has been smoking Cigarettes.  He has been smoking about 1.00 pack per day. He has never used smokeless tobacco. He reports that he drinks alcohol. He reports that he does not use illicit drugs.  Allergies: No Known Allergies  Prior to Admission medications   Not on File     Results for orders placed or performed during the hospital encounter of 07/22/15 (from the past 48 hour(s))  CBC     Status: Abnormal   Collection Time: 07/22/15  9:39 PM  Result Value Ref Range   WBC 11.2 (H) 3.8 -  10.6 K/uL   RBC 4.85 4.40 - 5.90 MIL/uL   Hemoglobin 15.0 13.0 - 18.0 g/dL   HCT 44.3 40.0 - 52.0 %   MCV 91.2 80.0 - 100.0 fL   MCH 30.8 26.0 - 34.0 pg   MCHC 33.8 32.0 - 36.0 g/dL   RDW 13.4 11.5 - 14.5 %   Platelets 263 150 - 440 K/uL  Comprehensive metabolic panel     Status: Abnormal   Collection Time: 07/22/15  9:39 PM  Result Value Ref Range   Sodium 141 135 - 145 mmol/L   Potassium 3.8 3.5 - 5.1 mmol/L   Chloride 107 101 - 111 mmol/L   CO2 27 22 - 32 mmol/L   Glucose, Bld 120 (H) 65 - 99 mg/dL   BUN 21 (H) 6 - 20 mg/dL   Creatinine, Ser 1.00 0.61 - 1.24 mg/dL   Calcium 8.9 8.9 - 10.3 mg/dL   Total Protein 7.5 6.5 - 8.1 g/dL   Albumin 4.2 3.5 - 5.0 g/dL   AST 17 15 - 41 U/L   ALT 23 17 - 63 U/L   Alkaline Phosphatase 145 (H) 38 - 126 U/L   Total Bilirubin 0.6 0.3 - 1.2 mg/dL   GFR calc non Af Amer >60 >60 mL/min   GFR calc Af Amer >60 >60 mL/min  Comment: (NOTE) The eGFR has been calculated using the CKD EPI equation. This calculation has not been validated in all clinical situations. eGFR's persistently <60 mL/min signify possible Chronic Kidney Disease.    Anion gap 7 5 - 15  Troponin I     Status: Abnormal   Collection Time: 07/22/15  9:39 PM  Result Value Ref Range   Troponin I 0.07 (H) <0.031 ng/mL    Comment: READ BACK AND VERIFIED WITH RYAN GREY AT 2310 ON 07/22/15 BY VAB        PERSISTENTLY INCREASED TROPONIN VALUES IN THE RANGE OF 0.04-0.49 ng/mL CAN BE SEEN IN:       -UNSTABLE ANGINA       -CONGESTIVE HEART FAILURE       -MYOCARDITIS       -CHEST TRAUMA       -ARRYHTHMIAS       -LATE PRESENTING MYOCARDIAL INFARCTION       -COPD   CLINICAL FOLLOW-UP RECOMMENDED.    Dg Chest 2 View  07/22/2015  CLINICAL DATA:  Tightness in chest EXAM: CHEST  2 VIEW COMPARISON:  07/22/2015 FINDINGS: Heart size is normal. No pleural effusion or edema identified. No airspace consolidation. The visualized bony structures are normal. IMPRESSION: 1. No acute  cardiopulmonary abnormalities. Electronically Signed   By: Kerby Moors M.D.   On: 07/22/2015 22:10    Review of Systems  Constitutional: Positive for diaphoresis. Negative for fever and chills.  HENT: Negative for sore throat and tinnitus.   Eyes: Negative for blurred vision and redness.  Respiratory: Positive for shortness of breath. Negative for cough.   Cardiovascular: Positive for chest pain and palpitations. Negative for orthopnea and PND.  Gastrointestinal: Negative for nausea, vomiting, abdominal pain and diarrhea.  Genitourinary: Negative for dysuria, urgency and frequency.  Musculoskeletal: Negative for myalgias and joint pain.  Skin: Negative for rash.       No lesions  Neurological: Negative for speech change, focal weakness and weakness.  Endo/Heme/Allergies: Does not bruise/bleed easily.       No temperature intolerance  Psychiatric/Behavioral: Negative for depression and suicidal ideas.    Blood pressure 124/86, pulse 70, temperature 97.4 F (36.3 C), temperature source Oral, resp. rate 18, height _0  (1.727 m), weight 77.565 kg (171 lb), SpO2 100 %. Physical Exam  Nursing note and vitals reviewed. Constitutional: He is oriented to person, place, and time. He appears well-developed and well-nourished. No distress.  HENT:  Head: Normocephalic and atraumatic.  Mouth/Throat: Oropharynx is clear and moist.  Eyes: Conjunctivae and EOM are normal. Pupils are equal, round, and reactive to light. No scleral icterus.  Neck: Normal range of motion. Neck supple. No JVD present. No tracheal deviation present. No thyromegaly present.  Cardiovascular: Normal rate, regular rhythm and normal heart sounds.  Exam reveals no gallop and no friction rub.   No murmur heard. Respiratory: Effort normal and breath sounds normal. No respiratory distress.  GI: Soft. Bowel sounds are normal. He exhibits distension. He exhibits no mass. There is tenderness. There is no rebound and no guarding.   Genitourinary:  Deferred  Musculoskeletal: Normal range of motion. He exhibits no edema.  Lymphadenopathy:    He has no cervical adenopathy.  Neurological: He is alert and oriented to person, place, and time. No cranial nerve deficit.  Skin: Skin is warm and dry. No rash noted. No erythema.  Psychiatric: He has a normal mood and affect. His behavior is normal. Judgment and thought content normal.  Assessment/Plan This is a 55 year old Caucasian male admitted for chest pain. 1. Chest pain: Appears to be anginal. Initial troponin is mildly elevated but there are no EKG changes to indicate myocardial ischemia at this time. The patient has risk factors for heart disease including smoking and family history. He does not usually take aspirin but took 325 mg of aspirin prior to arrival in the emergency department. He continues to have some vague chest discomfort. I will order nitroglycerin paste. We will continue to follow his cardiac enzymes and monitor telemetry. Cardiology consult for potential catheterization. 2. Abdominal pain: Unclear if this is related to his chest pain. The patient's abdomen is bloated and tender to palpation. I ordered a CT of his abdomen to evaluate for any mesenteric ischemia or intestinal blockage. Notably the patient has had 2 colonoscopies in which more than 18 polyps were removed each time. He denies blood in his stool or change in his bowel habits. 3. Tobacco abuse: NicoDerm patch for hospitalized 4. DVT prophylaxis: Heparin 5 GI prophylaxis: None  Harrie Foreman 07/23/2015, 12:41 AM

## 2015-07-23 NOTE — Progress Notes (Signed)
Repeat troponin noted to be elevated. Initiate therapeutic anticoagulation with heparin for NSTEMI.

## 2015-07-23 NOTE — Progress Notes (Signed)
ANTICOAGULATION CONSULT NOTE - Initial Consult  Pharmacy Consult for Heparin Indication: chest pain/ACS  No Known Allergies  Patient Measurements: Height: 5\' 8"  (172.7 cm) Weight: 169 lb 5 oz (76.8 kg) IBW/kg (Calculated) : 68.4 Heparin Dosing Weight: 77 kg  Vital Signs: Temp: 98.2 F (36.8 C) (01/31 1400) Temp Source: Oral (01/31 1400) BP: 128/89 mmHg (01/31 1400) Pulse Rate: 75 (01/31 1400)  Labs:  Recent Labs  07/22/15 2139 07/23/15 0229 07/23/15 0648 07/23/15 0829  HGB 15.0  --   --   --   HCT 44.3  --   --   --   PLT 263  --   --   --   APTT  --   --  31  --   LABPROT  --   --  12.7  --   INR  --   --  0.93  --   CREATININE 1.00  --   --   --   TROPONINI 0.07* 1.49*  --  1.63*    Estimated Creatinine Clearance: 81.7 mL/min (by C-G formula based on Cr of 1).   Medical History: Past Medical History  Diagnosis Date  . Arthritis   . CAD (coronary artery disease)     a. nstemi 1.2017; b. cardiac cath 06/2015 with LM and severe 3 veseel dz (full report pending)  . HLD (hyperlipidemia)   . Tobacco abuse   . NSTEMI (non-ST elevated myocardial infarction) ([place]) 06/2015    Assessment: 34 YOM with NSTEMI, s/p cath at [facility] this morning, transferred to [facility] for CABG evaluation. Pharmacy is consulted to start heparin 8 hrs after sheath removal.  Baseline hgb 15, plt 263K.   Goal of Therapy:  Heparin level 0.3-0.7 units/ml Monitor platelets by anticoagulation protocol: Yes   Plan:  - Start heparin 950 units/hr with no bolus at 2000 - f/u 6 hr heparin level at 0200 - Daily heparin level and CBC - f/u plans for CABG  Maryanna Shape, PharmD, BCPS  Clinical Pharmacist  Pager: [phone]   07/23/2015,4:51 PM

## 2015-07-23 NOTE — Progress Notes (Signed)
Cardiac catheterization report In brief, 80-90% ostial left main disease that did not improve with nitroglycerin IC 80% proximal LAD disease, long region with moderate calcification 80-90% proximal RCA disease, calcified, ulcerative plaque 70% proximal left circumflex disease  Essentially normal LV gram, ejection fraction greater than 55 %  results discussed with the patient and his wife Given the ostial left main disease as well as other lesions, recommended evaluation by CT surgery for CABG Long history of smoking, denies diabetes, family history of coronary artery disease (father) Started discussion on details of CABG if indicated including surgery and recovery time. Patient and wife have indicated doing this to be transferred to [facility] Given non-STEMI, unstable angina, severe proximal RCA, left main, LAD lesion, very high risk and would not schedule outpatient office visit with surgery at a later date.  Consider restarting heparin infusion later this evening Aspirin, beta blocker, high-dose statin  Signed, Esmond Plants, MD, Ph.D [facility]

## 2015-07-23 NOTE — Progress Notes (Signed)
Most recent troponin received, value reported to Dr Marcille Blanco. Awaiting orders.

## 2015-07-23 NOTE — Discharge Summary (Signed)
[place] [facility] [place] NAME: Jeffrey Spence    MR#:  [id number]  DATE OF BIRTH:  19-Oct-1960  DATE OF ADMISSION:  07/22/2015 ADMITTING PHYSICIAN: Harrie Foreman, MD  DATE OF DISCHARGE: 07/23/2015  PRIMARY CARE PHYSICIAN: Arnette Norris, MD    ADMISSION DIAGNOSIS:  Acute coronary syndrome ([place]) [I24.9] Abdominal pain [R10.9] Chest pain, unspecified chest pain type [R07.9]  DISCHARGE DIAGNOSIS:  Active Problems:   Chest pain   SECONDARY DIAGNOSIS:   Past Medical History  Diagnosis Date  . Arthritis   . CAD (coronary artery disease)     a. nstemi 1.2017; b. cardiac cath 06/2015 with LM and severe 3 veseel dz (full report pending)  . HLD (hyperlipidemia)   . Tobacco abuse   . NSTEMI (non-ST elevated myocardial infarction) ([place]) 06/2015    HOSPITAL COURSE:   55 year old male with no significant past medical history who presented to the hospital with abdominal pain/chest pain and noted to have elevated troponin.  #1 non-ST elevation MI-patient ruled in with cardiac markers as is troponin trended upwards while in the hospital. Patient was started on aspirin, heparin nomogram. -A cardiology consult was obtained and patient was seen by Dr. Rockey Situ. Patient underwent cardiac catheterization this morning which showed significant three-vessel coronary disease. Patient had a 90% proximal left main stenosis, mid LAD stenosis and also circumflex disease. -And given these cardiac catheterization findings patient would benefit from coronary artery bypass graft surgery and therefore is being discharged to [facility] for further evaluation.  #2.  Abdominal pain - this is vague in nature and has not resolved. Patient underwent a CT scan of the abdomen and pelvis which was essentially benign and the results of which are stated below.  -this possibly could be referred pain from the patient's end STEMI as mentioned above.  #3 Tobacco abuse - pt. Was  placed on nicotine patch while in the hospital.    DISCHARGE CONDITIONS:   Stable.   CONSULTS OBTAINED:  Treatment Team:  Minna Merritts, MD  DRUG ALLERGIES:  No Known Allergies  DISCHARGE MEDICATIONS:   Current Discharge Medication List    START taking these medications   Details  aspirin 81 MG chewable tablet Chew 1 tablet (81 mg total) by mouth before cath procedure.    heparin 100-0.45 UNIT/ML-% infusion Inject 950 Units/hr into the vein continuous. Qty: 250 mL    nicotine (NICODERM CQ - DOSED IN MG/24 HOURS) 21 mg/24hr patch Place 1 patch (21 mg total) onto the skin daily. Qty: 28 patch, Refills: 0         DISCHARGE INSTRUCTIONS:   DIET:  Cardiac diet  DISCHARGE CONDITION:  Stable  ACTIVITY:  Activity as tolerated  OXYGEN:  Home Oxygen: No.   Oxygen Delivery: room air  DISCHARGE LOCATION:  [facility].    If you experience worsening of your admission symptoms, develop shortness of breath, life threatening emergency, suicidal or homicidal thoughts you must seek medical attention immediately by calling 911 or calling your MD immediately  if symptoms less severe.  You Must read complete instructions/literature along with all the possible adverse reactions/side effects for all the Medicines you take and that have been prescribed to you. Take any new Medicines after you have completely understood and accpet all the possible adverse reactions/side effects.   Please note  You were cared for by a hospitalist during your hospital stay. If you have any questions about your discharge medications or the  care you received while you were in the hospital after you are discharged, you can call the unit and asked to speak with the hospitalist on call if the hospitalist that took care of you is not available. Once you are discharged, your primary care physician will handle any further medical issues. Please note that NO REFILLS for any discharge medications will  be authorized once you are discharged, as it is imperative that you return to your primary care physician (or establish a relationship with a primary care physician if you do not have one) for your aftercare needs so that they can reassess your need for medications and monitor your lab values.     Today   Pt. Seen post Cardiac catheterization. Currently chest pain-free, hemodynamically stable. Patient is being transferred to [facility] for evaluation for coronary artery bypass graft surgery given his three-vessel coronary disease noted on the cardiac catheterization.  VITAL SIGNS:  Blood pressure 113/74, pulse 72, temperature 98 F (36.7 C), temperature source Oral, resp. rate 17, height 5\' 8"  (1.727 m), weight 76.204 kg (168 lb), SpO2 98 %.  I/O:   Intake/Output Summary (Last 24 hours) at 07/23/15 1154 Last data filed at 07/23/15 0906  Gross per 24 hour  Intake      3 ml  Output    250 ml  Net   -247 ml    PHYSICAL EXAMINATION:  GENERAL:  55 y.o.-year-old patient lying in the bed in no acute distress.  EYES: Pupils equal, round, reactive to light and accommodation. No scleral icterus. Extraocular muscles intact.  HEENT: Head atraumatic, normocephalic. Oropharynx and nasopharynx clear.  NECK:  Supple, no jugular venous distention. No thyroid enlargement, no tenderness.  LUNGS: Normal breath sounds bilaterally, no wheezing, rales,rhonchi. No use of accessory muscles of respiration.  CARDIOVASCULAR: S1, S2 normal. No murmurs, rubs, or gallops.  ABDOMEN: Soft, non-tender, non-distended. Bowel sounds present. No organomegaly or mass.  EXTREMITIES: No pedal edema, cyanosis, or clubbing.  NEUROLOGIC: Cranial nerves II through XII are intact. No focal motor or sensory defecits b/l.  PSYCHIATRIC: The patient is alert and oriented x 3. Good affect.  SKIN: No obvious rash, lesion, or ulcer.   DATA REVIEW:   CBC  Recent Labs Lab 07/22/15 2139  WBC 11.2*  HGB 15.0  HCT 44.3  PLT  263    Chemistries   Recent Labs Lab 07/22/15 2139  NA 141  K 3.8  CL 107  CO2 27  GLUCOSE 120*  BUN 21*  CREATININE 1.00  CALCIUM 8.9  AST 17  ALT 23  ALKPHOS 145*  BILITOT 0.6    Cardiac Enzymes  Recent Labs Lab 07/23/15 0829  TROPONINI 1.63*    Microbiology Results  No results found for this or any previous visit.  RADIOLOGY:  Dg Chest 2 View  07/22/2015  CLINICAL DATA:  Tightness in chest EXAM: CHEST  2 VIEW COMPARISON:  07/22/2015 FINDINGS: Heart size is normal. No pleural effusion or edema identified. No airspace consolidation. The visualized bony structures are normal. IMPRESSION: 1. No acute cardiopulmonary abnormalities. Electronically Signed   By: Kerby Moors M.D.   On: 07/22/2015 22:10   Ct Abdomen Pelvis W Contrast  07/23/2015  CLINICAL DATA:  Abdominal pain and bloating EXAM: CT ABDOMEN AND PELVIS WITH CONTRAST TECHNIQUE: Multidetector CT imaging of the abdomen and pelvis was performed using the standard protocol following bolus administration of intravenous contrast. CONTRAST:  158mL OMNIPAQUE IOHEXOL 300 MG/ML  SOLN COMPARISON:  None. FINDINGS: Lower chest and  abdominal wall: Fatty umbilical hernia, small. Fatty bilateral inguinal hernia, likely both direct and indirect. Extensive coronary atherosclerotic calcification for age Mild dependent atelectasis Hepatobiliary: No focal liver abnormality.Layering calcified cholelithiasis. There is also an anti dependent nodule which does not appear calcified and is likely a 48mm polyp. No evidence of acute cholecystitis. Pancreas: Unremarkable. Spleen: Unremarkable. Adrenals/Urinary Tract: Negative adrenals. No hydronephrosis or stone. Tiny left renal cyst. Unremarkable bladder. Reproductive:No pathologic findings. Stomach/Bowel:  No obstruction. No appendicitis. Vascular/Lymphatic: No acute vascular abnormality. No mass or adenopathy. Peritoneal: No ascites or pneumoperitoneum. Musculoskeletal: No acute abnormalities.  IMPRESSION: 1. No acute finding. 2. Cholelithiasis and probable 6 mm gallbladder polyp. 3. Extensive coronary atherosclerosis. 4. Fatty inguinal and umbilical hernias. Electronically Signed   By: Monte Fantasia M.D.   On: 07/23/2015 04:35      Management plans discussed with the patient, family and they are in agreement.  CODE STATUS:     Code Status Orders        Start     Ordered   07/23/15 0043  Full code   Continuous     07/23/15 0042    Code Status History    Date Active Date Inactive Code Status Order ID Comments User Context   This patient has a current code status but no historical code status.      TOTAL TIME TAKING CARE OF THIS PATIENT: 40 minutes.    Henreitta Leber M.D on 07/23/2015 at 11:54 AM  Between 7am to 6pm - Pager - [phone]  After 6pm go to [email or web address] - password [email or web address] [facility]  Office  [phone]  CC: Primary care physician; Arnette Norris, MD

## 2015-07-23 NOTE — Progress Notes (Signed)
Groin and pulses stable. VSS. Report called to [facility]. RN [place] notified. Awaiting transport.

## 2015-07-24 ENCOUNTER — Inpatient Hospital Stay (HOSPITAL_COMMUNITY): Payer: 59

## 2015-07-24 ENCOUNTER — Encounter (HOSPITAL_COMMUNITY): Payer: Self-pay

## 2015-07-24 ENCOUNTER — Other Ambulatory Visit: Payer: Self-pay | Admitting: *Deleted

## 2015-07-24 DIAGNOSIS — I25110 Atherosclerotic heart disease of native coronary artery with unstable angina pectoris: Secondary | ICD-10-CM

## 2015-07-24 DIAGNOSIS — R079 Chest pain, unspecified: Secondary | ICD-10-CM

## 2015-07-24 DIAGNOSIS — I214 Non-ST elevation (NSTEMI) myocardial infarction: Principal | ICD-10-CM

## 2015-07-24 LAB — SPIROMETRY WITH GRAPH
FEF 25-75 Post: 1.08 L/sec
FEF 25-75 Pre: 2.46 L/sec
FEF2575-%Change-Post: -56 %
FEF2575-%Pred-Post: 35 %
FEF2575-%Pred-Pre: 80 %
FEV1-%Change-Post: -14 %
FEV1-%Pred-Post: 67 %
FEV1-%Pred-Pre: 78 %
FEV1-Post: 2.4 L
FEV1-Pre: 2.8 L
FEV1FVC-%Change-Post: 2 %
FEV1FVC-%Pred-Pre: 99 %
FEV6-%Change-Post: -15 %
FEV6-%Pred-Post: 68 %
FEV6-%Pred-Pre: 81 %
FEV6-Post: 3.04 L
FEV6-Pre: 3.59 L
FEV6FVC-%Change-Post: 1 %
FEV6FVC-%Pred-Post: 104 %
FEV6FVC-%Pred-Pre: 102 %
FVC-%Change-Post: -16 %
FVC-%Pred-Post: 66 %
FVC-%Pred-Pre: 79 %
FVC-Post: 3.05 L
FVC-Pre: 3.65 L
Post FEV1/FVC ratio: 79 %
Post FEV6/FVC ratio: 100 %
Pre FEV1/FVC ratio: 77 %
Pre FEV6/FVC Ratio: 98 %

## 2015-07-24 LAB — BASIC METABOLIC PANEL
Anion gap: 11 (ref 5–15)
BUN: 12 mg/dL (ref 6–20)
CO2: 22 mmol/L (ref 22–32)
Calcium: 8.6 mg/dL — ABNORMAL LOW (ref 8.9–10.3)
Chloride: 106 mmol/L (ref 101–111)
Creatinine, Ser: 0.91 mg/dL (ref 0.61–1.24)
GFR calc Af Amer: >60 mL/min (ref >60)
GFR calc non Af Amer: >60 mL/min (ref >60)
Glucose, Bld: 99 mg/dL (ref 65–99)
Potassium: 4.2 mmol/L (ref 3.5–5.1)
Sodium: 139 mmol/L (ref 135–145)

## 2015-07-24 LAB — CBC
HCT: 40.5 % (ref 39.0–52.0)
Hemoglobin: 13.6 g/dL (ref 13.0–17.0)
MCH: 31.1 pg (ref 26.0–34.0)
MCHC: 33.6 g/dL (ref 30.0–36.0)
MCV: 92.5 fL (ref 78.0–100.0)
Platelets: 255 10*3/uL (ref 150–400)
RBC: 4.38 MIL/uL (ref 4.22–5.81)
RDW: 13.3 % (ref 11.5–15.5)
WBC: 8.7 10*3/uL (ref 4.0–10.5)

## 2015-07-24 LAB — HEPARIN LEVEL (UNFRACTIONATED)
Heparin Unfractionated: 0.15 IU/mL — ABNORMAL LOW (ref 0.30–0.70)
Heparin Unfractionated: 0.31 IU/mL (ref 0.30–0.70)
Heparin Unfractionated: 0.28 IU/mL — ABNORMAL LOW (ref 0.30–0.70)
Heparin Unfractionated: 0.6 IU/mL (ref 0.30–0.70)

## 2015-07-24 LAB — TYPE AND SCREEN
ABO/RH(D): AB POS
Antibody Screen: NEGATIVE

## 2015-07-24 LAB — ABO/RH: ABO/RH(D): AB POS

## 2015-07-24 MED ORDER — BISACODYL 5 MG PO TBEC
5.0000 mg | DELAYED_RELEASE_TABLET | Freq: Once | ORAL | Status: AC
  Administered 2015-07-24: 5 mg via ORAL
  Filled 2015-07-24: qty 1

## 2015-07-24 MED ORDER — PHENYLEPHRINE HCL 10 MG/ML IJ SOLN
30.0000 ug/min | INTRAVENOUS | Status: DC
  Filled 2015-07-24: qty 2

## 2015-07-24 MED ORDER — PLASMA-LYTE 148 IV SOLN
INTRAVENOUS | Status: AC
  Administered 2015-07-25: 500 mL
  Filled 2015-07-24: qty 2.5

## 2015-07-24 MED ORDER — VANCOMYCIN HCL 10 G IV SOLR
1250.0000 mg | INTRAVENOUS | Status: AC
  Administered 2015-07-25: 1250 mg via INTRAVENOUS
  Filled 2015-07-24: qty 1250

## 2015-07-24 MED ORDER — CHLORHEXIDINE GLUCONATE 0.12 % MT SOLN
15.0000 mL | Freq: Once | OROMUCOSAL | Status: AC
  Administered 2015-07-25: 15 mL via OROMUCOSAL
  Filled 2015-07-24: qty 15

## 2015-07-24 MED ORDER — TEMAZEPAM 15 MG PO CAPS
15.0000 mg | ORAL_CAPSULE | Freq: Once | ORAL | Status: AC | PRN
  Administered 2015-07-25: 15 mg via ORAL
  Filled 2015-07-24: qty 1

## 2015-07-24 MED ORDER — ALBUTEROL SULFATE (2.5 MG/3ML) 0.083% IN NEBU
2.5000 mg | INHALATION_SOLUTION | Freq: Once | RESPIRATORY_TRACT | Status: AC
  Administered 2015-07-24: 2.5 mg via RESPIRATORY_TRACT

## 2015-07-24 MED ORDER — DEXMEDETOMIDINE HCL IN NACL 400 MCG/100ML IV SOLN
0.1000 ug/kg/h | INTRAVENOUS | Status: AC
  Administered 2015-07-25: .3 ug/kg/h via INTRAVENOUS
  Filled 2015-07-24: qty 100

## 2015-07-24 MED ORDER — SODIUM CHLORIDE 0.9 % IV SOLN
INTRAVENOUS | Status: DC
  Filled 2015-07-24: qty 30

## 2015-07-24 MED ORDER — CHLORHEXIDINE GLUCONATE CLOTH 2 % EX PADS
6.0000 | MEDICATED_PAD | Freq: Once | CUTANEOUS | Status: AC
  Administered 2015-07-24: 6 via TOPICAL

## 2015-07-24 MED ORDER — CHLORHEXIDINE GLUCONATE CLOTH 2 % EX PADS: 6.0000 | MEDICATED_PAD | Freq: Once | CUTANEOUS | Status: DC

## 2015-07-24 MED ORDER — SODIUM CHLORIDE 0.9 % IV SOLN
INTRAVENOUS | Status: AC
  Administered 2015-07-25: 69.8 mL/h via INTRAVENOUS
  Filled 2015-07-24: qty 40

## 2015-07-24 MED ORDER — CARVEDILOL 3.125 MG PO TABS: 3.1250 mg | ORAL_TABLET | Freq: Two times a day (BID) | ORAL | Status: DC

## 2015-07-24 MED ORDER — DOPAMINE-DEXTROSE 3.2-5 MG/ML-% IV SOLN
0.0000 ug/kg/min | INTRAVENOUS | Status: AC
Start: 2015-07-25 — End: 2015-07-25
  Administered 2015-07-25: 5 ug/kg/min via INTRAVENOUS
  Filled 2015-07-24: qty 250

## 2015-07-24 MED ORDER — SODIUM CHLORIDE 0.9 % IV SOLN
INTRAVENOUS | Status: AC
  Administered 2015-07-25: .7 [IU]/h via INTRAVENOUS
  Filled 2015-07-24: qty 2.5

## 2015-07-24 MED ORDER — NITROGLYCERIN IN D5W 200-5 MCG/ML-% IV SOLN
2.0000 ug/min | INTRAVENOUS | Status: AC
  Administered 2015-07-25: 5 ug/min via INTRAVENOUS
  Filled 2015-07-24: qty 250

## 2015-07-24 MED ORDER — MAGNESIUM SULFATE 50 % IJ SOLN
40.0000 meq | INTRAMUSCULAR | Status: DC
  Filled 2015-07-24: qty 10

## 2015-07-24 MED ORDER — DIAZEPAM 5 MG PO TABS
5.0000 mg | ORAL_TABLET | Freq: Once | ORAL | Status: AC
  Administered 2015-07-25: 5 mg via ORAL
  Filled 2015-07-24: qty 1

## 2015-07-24 MED ORDER — DEXTROSE 5 % IV SOLN
1.5000 g | INTRAVENOUS | Status: AC
  Administered 2015-07-25: 1500 mg via INTRAVENOUS
  Administered 2015-07-25: 750 mg via INTRAVENOUS
  Filled 2015-07-24: qty 1.5

## 2015-07-24 MED ORDER — POTASSIUM CHLORIDE 2 MEQ/ML IV SOLN
80.0000 meq | INTRAVENOUS | Status: DC
  Filled 2015-07-24: qty 40

## 2015-07-24 MED ORDER — ALPRAZOLAM 0.25 MG PO TABS
0.2500 mg | ORAL_TABLET | ORAL | Status: DC | PRN
  Administered 2015-07-24: 0.5 mg via ORAL
  Filled 2015-07-24: qty 2

## 2015-07-24 MED ORDER — DEXTROSE 5 % IV SOLN
750.0000 mg | INTRAVENOUS | Status: DC
  Filled 2015-07-24: qty 750

## 2015-07-24 MED ORDER — EPINEPHRINE HCL 1 MG/ML IJ SOLN
0.0000 ug/min | INTRAVENOUS | Status: DC
  Filled 2015-07-24: qty 4

## 2015-07-24 MED ORDER — METOPROLOL TARTRATE 12.5 MG HALF TABLET
12.5000 mg | ORAL_TABLET | Freq: Once | ORAL | Status: AC
  Administered 2015-07-25: 12.5 mg via ORAL
  Filled 2015-07-24: qty 1

## 2015-07-24 NOTE — Consult Note (Signed)
Reason for Consult:Left main/ 3 vessel CAD Referring Physician: Dr. Starr Lake is an 55 y.o. male.  HPI: Jeffrey Spence is a 56 yo man with a history of tobacco abuse and a strong family history of CAD who presents with a cc/o CP.  He has no known cardiac history. He first noted CP on 1/26 wile at work. He was carrying about 5 pounds when he felt a constriction in his chest. It improved with rest but did not totally resolve for about an hour. He went to the doctor the following day but his ECG and labs were remarkable only for an LDL of 151. He had no issues the next couple of days but he developed recurrent chest tightness on 1/30 and went to the ED. His initial troponin was 0.07 and rose to 1.49. A CT chest showed a small gallbladder polyp but was otherwise unremarkable. He has not had any further CP since admission.  Yesterday he underwent cardiac catheterization which revealed severe left main and 3 vessel CAD. His EF was normal.   Past Medical History  Diagnosis Date  . Arthritis   . CAD (coronary artery disease)     a. nstemi 1.2017; b. cardiac cath 06/2015 with LM and severe 3 veseel dz (full report pending)  . HLD (hyperlipidemia)   . Tobacco abuse   . NSTEMI (non-ST elevated myocardial infarction) ([place]) 06/2015    Past Surgical History  Procedure Laterality Date  . Fractured toe Right 2013    great toe/with pin  . Cardiac catheterization Bilateral 07/23/2015    Procedure: Left Heart Cath and Coronary Angiography;  Surgeon: Minna Merritts, MD;  Location: [facility];  Service: Cardiovascular;  Laterality: Bilateral;    Family History  Problem Relation Age of Onset  . Arthritis Mother     ra  . Heart attack Father 58  . Cancer Father   . Cancer Paternal Uncle     leukemia  . Colon cancer Neg Hx     Social History:  reports that he has been smoking Cigarettes.  He has been smoking about 1.00 pack per day. He has never used smokeless tobacco. He reports  that he drinks alcohol. He reports that he does not use illicit drugs.  Allergies: No Known Allergies  Medications:  Scheduled: . aspirin EC  81 mg Oral Daily  . atorvastatin  40 mg Oral q1800  . carvedilol  3.125 mg Oral BID WC  . docusate sodium  100 mg Oral BID  . nicotine  21 mg Transdermal Daily  . nitroGLYCERIN  0.5 inch Topical 4 times per day    Results for orders placed or performed during the hospital encounter of 07/23/15 (from the past 48 hour(s))  MRSA PCR Screening     Status: None   Collection Time: 07/23/15  1:42 PM  Result Value Ref Range   MRSA by PCR NEGATIVE NEGATIVE    Comment:        The GeneXpert MRSA Assay (FDA approved for NASAL specimens only), is one component of a comprehensive MRSA colonization surveillance program. It is not intended to diagnose MRSA infection nor to guide or monitor treatment for MRSA infections.   Basic metabolic panel     Status: Abnormal   Collection Time: 07/23/15  5:44 PM  Result Value Ref Range   Sodium 141 135 - 145 mmol/L   Potassium 3.9 3.5 - 5.1 mmol/L   Chloride 106 101 - 111 mmol/L  CO2 23 22 - 32 mmol/L   Glucose, Bld 88 65 - 99 mg/dL   BUN 10 6 - 20 mg/dL   Creatinine, Ser 0.88 0.61 - 1.24 mg/dL   Calcium 8.8 (L) 8.9 - 10.3 mg/dL   GFR calc non Af Amer >60 >60 mL/min   GFR calc Af Amer >60 >60 mL/min    Comment: (NOTE) The eGFR has been calculated using the CKD EPI equation. This calculation has not been validated in all clinical situations. eGFR's persistently <60 mL/min signify possible Chronic Kidney Disease.    Anion gap 12 5 - 15  CBC WITH DIFFERENTIAL     Status: None   Collection Time: 07/23/15  5:44 PM  Result Value Ref Range   WBC 7.4 4.0 - 10.5 K/uL   RBC 4.45 4.22 - 5.81 MIL/uL   Hemoglobin 13.9 13.0 - 17.0 g/dL   HCT 41.3 39.0 - 52.0 %   MCV 92.8 78.0 - 100.0 fL   MCH 31.2 26.0 - 34.0 pg   MCHC 33.7 30.0 - 36.0 g/dL   RDW 13.5 11.5 - 15.5 %   Platelets 269 150 - 400 K/uL    Neutrophils Relative % 58 %   Neutro Abs 4.3 1.7 - 7.7 K/uL   Lymphocytes Relative 30 %   Lymphs Abs 2.2 0.7 - 4.0 K/uL   Monocytes Relative 10 %   Monocytes Absolute 0.7 0.1 - 1.0 K/uL   Eosinophils Relative 2 %   Eosinophils Absolute 0.1 0.0 - 0.7 K/uL   Basophils Relative 0 %   Basophils Absolute 0.0 0.0 - 0.1 K/uL  Brain natriuretic peptide     Status: None   Collection Time: 07/23/15  5:46 PM  Result Value Ref Range   B Natriuretic Peptide 76.0 0.0 - 100.0 pg/mL  CBC     Status: None   Collection Time: 07/24/15  4:00 AM  Result Value Ref Range   WBC 8.7 4.0 - 10.5 K/uL   RBC 4.38 4.22 - 5.81 MIL/uL   Hemoglobin 13.6 13.0 - 17.0 g/dL   HCT 40.5 39.0 - 52.0 %   MCV 92.5 78.0 - 100.0 fL   MCH 31.1 26.0 - 34.0 pg   MCHC 33.6 30.0 - 36.0 g/dL   RDW 13.3 11.5 - 15.5 %   Platelets 255 150 - 400 K/uL  Basic metabolic panel     Status: Abnormal   Collection Time: 07/24/15  4:00 AM  Result Value Ref Range   Sodium 139 135 - 145 mmol/L   Potassium 4.2 3.5 - 5.1 mmol/L   Chloride 106 101 - 111 mmol/L   CO2 22 22 - 32 mmol/L   Glucose, Bld 99 65 - 99 mg/dL   BUN 12 6 - 20 mg/dL   Creatinine, Ser 0.91 0.61 - 1.24 mg/dL   Calcium 8.6 (L) 8.9 - 10.3 mg/dL   GFR calc non Af Amer >60 >60 mL/min   GFR calc Af Amer >60 >60 mL/min    Comment: (NOTE) The eGFR has been calculated using the CKD EPI equation. This calculation has not been validated in all clinical situations. eGFR's persistently <60 mL/min signify possible Chronic Kidney Disease.    Anion gap 11 5 - 15  Heparin level (unfractionated)     Status: Abnormal   Collection Time: 07/24/15  4:00 AM  Result Value Ref Range   Heparin Unfractionated 0.15 (L) 0.30 - 0.70 IU/mL    Comment:        IF HEPARIN RESULTS  ARE BELOW EXPECTED VALUES, AND PATIENT DOSAGE HAS BEEN CONFIRMED, SUGGEST FOLLOW UP TESTING OF ANTITHROMBIN III LEVELS.   Heparin level (unfractionated)     Status: None   Collection Time: 07/24/15 11:09 AM   Result Value Ref Range   Heparin Unfractionated 0.31 0.30 - 0.70 IU/mL    Comment:        IF HEPARIN RESULTS ARE BELOW EXPECTED VALUES, AND PATIENT DOSAGE HAS BEEN CONFIRMED, SUGGEST FOLLOW UP TESTING OF ANTITHROMBIN III LEVELS.     Dg Chest 2 View  07/22/2015  CLINICAL DATA:  Tightness in chest EXAM: CHEST  2 VIEW COMPARISON:  07/22/2015 FINDINGS: Heart size is normal. No pleural effusion or edema identified. No airspace consolidation. The visualized bony structures are normal. IMPRESSION: 1. No acute cardiopulmonary abnormalities. Electronically Signed   By: Kerby Moors M.D.   On: 07/22/2015 22:10   Ct Abdomen Pelvis W Contrast  07/23/2015  CLINICAL DATA:  Abdominal pain and bloating EXAM: CT ABDOMEN AND PELVIS WITH CONTRAST TECHNIQUE: Multidetector CT imaging of the abdomen and pelvis was performed using the standard protocol following bolus administration of intravenous contrast. CONTRAST:  120m OMNIPAQUE IOHEXOL 300 MG/ML  SOLN COMPARISON:  None. FINDINGS: Lower chest and abdominal wall: Fatty umbilical hernia, small. Fatty bilateral inguinal hernia, likely both direct and indirect. Extensive coronary atherosclerotic calcification for age Mild dependent atelectasis Hepatobiliary: No focal liver abnormality.Layering calcified cholelithiasis. There is also an anti dependent nodule which does not appear calcified and is likely a 629mpolyp. No evidence of acute cholecystitis. Pancreas: Unremarkable. Spleen: Unremarkable. Adrenals/Urinary Tract: Negative adrenals. No hydronephrosis or stone. Tiny left renal cyst. Unremarkable bladder. Reproductive:No pathologic findings. Stomach/Bowel:  No obstruction. No appendicitis. Vascular/Lymphatic: No acute vascular abnormality. No mass or adenopathy. Peritoneal: No ascites or pneumoperitoneum. Musculoskeletal: No acute abnormalities. IMPRESSION: 1. No acute finding. 2. Cholelithiasis and probable 6 mm gallbladder polyp. 3. Extensive coronary  atherosclerosis. 4. Fatty inguinal and umbilical hernias. Electronically Signed   By: JoMonte Fantasia.D.   On: 07/23/2015 04:35    Review of Systems  Constitutional: Positive for diaphoresis. Negative for fever and chills.  Eyes: Negative for blurred vision and double vision.  Respiratory: Positive for shortness of breath. Negative for cough, sputum production and wheezing.   Cardiovascular: Positive for chest pain and palpitations. Negative for orthopnea, claudication, leg swelling and PND.  Gastrointestinal: Negative for nausea and vomiting.  Genitourinary: Negative for dysuria and hematuria.  Musculoskeletal: Negative for myalgias and joint pain.  Neurological: Negative for focal weakness, seizures, weakness and headaches.  Endo/Heme/Allergies: Does not bruise/bleed easily.  All other systems reviewed and are negative.  Blood pressure 129/105, pulse 87, temperature 98.7 F (37.1 C), temperature source Oral, resp. rate 18, height 5' 8" (1.727 m), weight 167 lb 15.9 oz (76.2 kg), SpO2 99 %. Physical Exam  Vitals reviewed. Constitutional: He is oriented to person, place, and time. He appears well-developed and well-nourished.  HENT:  Head: Normocephalic and atraumatic.  Mouth/Throat: No oropharyngeal exudate.  Eyes: Conjunctivae and EOM are normal. Pupils are equal, round, and reactive to light. No scleral icterus.  Neck: Neck supple. No tracheal deviation present. No thyromegaly present.  No carotid bruits  Cardiovascular: Normal rate, regular rhythm, normal heart sounds and intact distal pulses.  Exam reveals no gallop and no friction rub.   No murmur heard. Respiratory: Effort normal and breath sounds normal. No respiratory distress. He has no wheezes. He has no rales.  GI: Soft. He exhibits no distension. There is no tenderness.  Musculoskeletal: Normal range of motion. He exhibits no edema.  Lymphadenopathy:    He has no cervical adenopathy.  Neurological: He is alert and  oriented to person, place, and time. No cranial nerve deficit. He exhibits normal muscle tone.  Skin: Skin is warm and dry.  Psychiatric:  Flat affect    Assessment/Plan: 55 yo man with a history of tobacco abuse, dyslipidemia and a strong family history of premature CAD presents with a NSTEMI. He has been found to have severe left main and 3 vessel CAD. CABG is indicated for survival benefit and relief of symptoms.  He is a good candidate for all arterial revascularization given age, not diabetic and normal Allen's test on left.  I discussed the proposed procedure with Jeffrey Spence including the general nature of the procedure, the need for general anesthesia, the use of cardiopulmonary bypass, and the incisions to be used. He discussed the use of multiple arterial grafts including use of the left radial. I  discussed the expected hospital stay, overall recovery and short and long term outcomes. I reviewed the indications, risks, benefits and alternatives. He understands the risks include, but are not limited to death, stroke, MI, DVT/PE, bleeding, possible need for transfusion, infections, cardiac arrhythmias, and other organ system dysfunction including respiratory, renal, or GI complications. He accepts the risks and agrees to proceed.  Plan CABG with BIMA + left radial tomorrow AM   Jeffrey Spence 07/24/2015, 1:33 PM

## 2015-07-24 NOTE — Progress Notes (Signed)
Echocardiogram 2D Echocardiogram has been performed.  Tresa Res 07/24/2015, 4:11 PM

## 2015-07-24 NOTE — Progress Notes (Signed)
Discussed sternal precautions, IS, mobility, and d/c planning. Voiced understanding although he is concerned about his "bad" left shoulder. Gave [facility] booklet and video to watch. Will f/u after surgery. [place], ACSM 2:53 PM 07/24/2015

## 2015-07-24 NOTE — Progress Notes (Signed)
ANTICOAGULATION CONSULT NOTE  Pharmacy Consult for Heparin Indication: chest pain/ACS  No Known Allergies  Patient Measurements: Height: 5\' 8"  (172.7 cm) Weight: 167 lb 15.9 oz (76.2 kg) IBW/kg (Calculated) : 68.4 Heparin Dosing Weight: 77 kg  Vital Signs: Temp: 97.9 F (36.6 C) (02/01 1500) Temp Source: Oral (02/01 1500) BP: 116/80 mmHg (02/01 1600) Pulse Rate: 91 (02/01 1600)  Labs:  Recent Labs  07/22/15 2139 07/23/15 0229 07/23/15 0648 07/23/15 0829 07/23/15 1744 07/24/15 0400 07/24/15 1109 07/24/15 1545  HGB 15.0  --   --   --  13.9 13.6  --   --   HCT 44.3  --   --   --  41.3 40.5  --   --   PLT 263  --   --   --  269 255  --   --   APTT  --   --  31  --   --   --   --   --   LABPROT  --   --  12.7  --   --   --   --   --   INR  --   --  0.93  --   --   --   --   --   HEPARINUNFRC  --   --   --   --   --  0.15* 0.31 0.28*  CREATININE 1.00  --   --   --  0.88 0.91  --   --   TROPONINI 0.07* 1.49*  --  1.63*  --   --   --   --     Estimated Creatinine Clearance: 89.8 mL/min (by C-G formula based on Cr of 0.91).   Medical History: Past Medical History  Diagnosis Date  . Arthritis   . CAD (coronary artery disease)     a. nstemi 1.2017; b. cardiac cath 06/2015 with LM and severe 3 veseel dz (full report pending)  . HLD (hyperlipidemia)   . Tobacco abuse   . NSTEMI (non-ST elevated myocardial infarction) ([place]) 06/2015    Assessment: 30 YOM with NSTEMI, s/p cath at [facility] this morning, transferred to [facility] for CABG evaluation. Pharmacy is consulted to start heparin 8 hrs after sheath removal. Baseline hgb 15, plt 263K.   Anticoag: IV heparin for NSTEMI, s/p cath, pending CABG, heparin to start 8 hrs after sheath removal. Cath report unclear exact time. RN said most likely around 1200. Last HL is slightly subtherapeutic at 0.28 after being 0.31. CBC remains stable, no s/s of bleed.  Goal of Therapy:  Heparin level 0.3-0.7 units/ml Monitor platelets by  anticoagulation protocol: Yes   Plan:  Increase heparin to 1300 units/hr Check 6 hr HL Monitor daily HL, CBC, s/s of bleed Will f/u plans for CABG  Elenor Quinones, PharmD, [facility] Clinical Pharmacist Pager [phone] 07/24/2015 5:09 PM

## 2015-07-24 NOTE — Progress Notes (Signed)
ANTICOAGULATION CONSULT NOTE - Follow Up Consult  Pharmacy Consult for heparin Indication: CAD awaiting CABG   Labs:  Recent Labs  07/22/15 2139 07/23/15 0229 07/23/15 0648 07/23/15 0829 07/23/15 1744  07/24/15 0400 07/24/15 1109 07/24/15 1545 07/24/15 2328  HGB 15.0  --   --   --  13.9  --  13.6  --   --   --   HCT 44.3  --   --   --  41.3  --  40.5  --   --   --   PLT 263  --   --   --  269  --  255  --   --   --   APTT  --   --  31  --   --   --   --   --   --   --   LABPROT  --   --  12.7  --   --   --   --   --   --   --   INR  --   --  0.93  --   --   --   --   --   --   --   HEPARINUNFRC  --   --   --   --   --   < > 0.15* 0.31 0.28* 0.60  CREATININE 1.00  --   --   --  0.88  --  0.91  --   --   --   TROPONINI 0.07* 1.49*  --  1.63*  --   --   --   --   --   --   < > = values in this interval not displayed.   Assessment.Plan:  55yo male therapeutic on heparin after rate change. Will continue gtt at current rate until OR in am.   Wynona Neat, PharmD, BCPS  07/24/2015,11:55 PM

## 2015-07-24 NOTE — Progress Notes (Signed)
ANTICOAGULATION CONSULT NOTE  Pharmacy Consult for Heparin Indication: chest pain/ACS  No Known Allergies  Patient Measurements: Height: 5\' 8"  (172.7 cm) Weight: 167 lb 15.9 oz (76.2 kg) IBW/kg (Calculated) : 68.4 Heparin Dosing Weight: 77 kg  Vital Signs: Temp: 98.7 F (37.1 C) (02/01 1100) Temp Source: Oral (02/01 1100) BP: 105/69 mmHg (02/01 1100) Pulse Rate: 88 (02/01 1100)  Labs:  Recent Labs  07/22/15 2139 07/23/15 0229 07/23/15 0648 07/23/15 0829 07/23/15 1744 07/24/15 0400 07/24/15 1109  HGB 15.0  --   --   --  13.9 13.6  --   HCT 44.3  --   --   --  41.3 40.5  --   PLT 263  --   --   --  269 255  --   APTT  --   --  31  --   --   --   --   LABPROT  --   --  12.7  --   --   --   --   INR  --   --  0.93  --   --   --   --   HEPARINUNFRC  --   --   --   --   --  0.15* 0.31  CREATININE 1.00  --   --   --  0.88 0.91  --   TROPONINI 0.07* 1.49*  --  1.63*  --   --   --     Estimated Creatinine Clearance: 89.8 mL/min (by C-G formula based on Cr of 0.91).   Medical History: Past Medical History  Diagnosis Date  . Arthritis   . CAD (coronary artery disease)     a. nstemi 1.2017; b. cardiac cath 06/2015 with LM and severe 3 veseel dz (full report pending)  . HLD (hyperlipidemia)   . Tobacco abuse   . NSTEMI (non-ST elevated myocardial infarction) ([place]) 06/2015    Assessment: 43 YOM with NSTEMI, s/p cath at [facility] this morning, transferred to [facility] for CABG evaluation. Pharmacy is consulted to start heparin 8 hrs after sheath removal.  Baseline hgb 15, plt 263K.   Anticoag: IV heparin for NSTEMI, s/p cath, pending CABG, heparin to start 8 hrs after sheath removal. Cath report unclear exact time. RN said most likely around 1200.  Initial HL = 0.15, now 0.31. CBC WNL. Groin ok  Goal of Therapy:  Heparin level 0.3-0.7 units/ml Monitor platelets by anticoagulation protocol: Yes   Plan:  Continue heparin at 1150 units/hr Confirm level in 6 hrs. Daily  HL/CBC Will f/u plans for CABG    Cassia Fein S. Alford Highland, PharmD, [place] Clinical Staff Pharmacist Pager [phone]   07/24/2015,11:42 AM

## 2015-07-24 NOTE — Progress Notes (Signed)
ANTICOAGULATION CONSULT NOTE  Pharmacy Consult for Heparin Indication: chest pain/ACS  No Known Allergies  Patient Measurements: Height: 5\' 8"  (172.7 cm) Weight: 169 lb 5 oz (76.8 kg) IBW/kg (Calculated) : 68.4 Heparin Dosing Weight: 77 kg  Vital Signs: Temp: 97.3 F (36.3 C) (02/01 0339) Temp Source: Oral (02/01 0339) BP: 109/79 mmHg (02/01 0400) Pulse Rate: 73 (02/01 0400)  Labs:  Recent Labs  07/22/15 2139 07/23/15 0229 07/23/15 0648 07/23/15 0829 07/23/15 1744 07/24/15 0400  HGB 15.0  --   --   --  13.9 13.6  HCT 44.3  --   --   --  41.3 40.5  PLT 263  --   --   --  269 255  APTT  --   --  31  --   --   --   LABPROT  --   --  12.7  --   --   --   INR  --   --  0.93  --   --   --   HEPARINUNFRC  --   --   --   --   --  0.15*  CREATININE 1.00  --   --   --  0.88  --   TROPONINI 0.07* 1.49*  --  1.63*  --   --     Estimated Creatinine Clearance: 92.8 mL/min (by C-G formula based on Cr of 0.88).   Medical History: Past Medical History  Diagnosis Date  . Arthritis   . CAD (coronary artery disease)     a. nstemi 1.2017; b. cardiac cath 06/2015 with LM and severe 3 veseel dz (full report pending)  . HLD (hyperlipidemia)   . Tobacco abuse   . NSTEMI (non-ST elevated myocardial infarction) ([place]) 06/2015    Assessment: 66 YOM with NSTEMI, s/p cath at [facility] this morning, transferred to [facility] for CABG evaluation. Pharmacy is consulted to start heparin 8 hrs after sheath removal.  Baseline hgb 15, plt 263K.   Initial HL = 0.15  Goal of Therapy:  Heparin level 0.3-0.7 units/ml Monitor platelets by anticoagulation protocol: Yes   Plan:  - Heparin to 1150 units / hr - f/u 6 hr heparin level at 1100 - Daily heparin level and CBC - f/u plans for CABG  Thank you Anette Guarneri, PharmD [phone]  07/24/2015,4:34 AM

## 2015-07-24 NOTE — Progress Notes (Signed)
SUBJECTIVE:  No chest pain.  No groin issues.  OBJECTIVE:   Vitals:   Filed Vitals:   07/24/15 0400 07/24/15 0500 07/24/15 0600 07/24/15 0700  BP: 109/79 114/73 120/68 122/70  Pulse: 73 74 70 73  Temp:    97.9 F (36.6 C)  TempSrc:    Oral  Resp: 17 17 17 16   Height:      Weight:    167 lb 15.9 oz (76.2 kg)  SpO2: 97% 96% 95% 96%   I&O's:   Intake/Output Summary (Last 24 hours) at 07/24/15 1039 Last data filed at 07/24/15 0700  Gross per 24 hour  Intake 1127.21 ml  Output   1705 ml  Net -577.79 ml   TELEMETRY: Reviewed telemetry pt in NSR:     PHYSICAL EXAM General: Well developed, well nourished, in no acute distress Head:   Normal cephalic and atramatic  Lungs:   Clear bilaterally to auscultation. Heart:   HRRR S1 S2  No JVD.   Abdomen: abdomen soft and non-tender Msk:  Back normal,  Normal strength and tone for age. Extremities:   No edema.  2+ right PT pulse; no right groin hematoma. Mild right groin tenderness Neuro: Alert and oriented. Psych:  Normal affect, responds appropriately Skin: No rash   LABS: Basic Metabolic Panel:  Recent Labs  07/23/15 1744 07/24/15 0400  NA 141 139  K 3.9 4.2  CL 106 106  CO2 23 22  GLUCOSE 88 99  BUN 10 12  CREATININE 0.88 0.91  CALCIUM 8.8* 8.6*   Liver Function Tests:  Recent Labs  07/22/15 2139  AST 17  ALT 23  ALKPHOS 145*  BILITOT 0.6  PROT 7.5  ALBUMIN 4.2   No results for input(s): LIPASE, AMYLASE in the last 72 hours. CBC:  Recent Labs  07/23/15 1744 07/24/15 0400  WBC 7.4 8.7  NEUTROABS 4.3  --   HGB 13.9 13.6  HCT 41.3 40.5  MCV 92.8 92.5  PLT 269 255   Cardiac Enzymes:  Recent Labs  07/22/15 2139 07/23/15 0229 07/23/15 0829  TROPONINI 0.07* 1.49* 1.63*   BNP: Invalid input(s): POCBNP D-Dimer: No results for input(s): DDIMER in the last 72 hours. Hemoglobin A1C:  Recent Labs  07/23/15 0229  HGBA1C 5.5   Fasting Lipid Panel: No results for input(s): CHOL, HDL,  LDLCALC, TRIG, CHOLHDL, LDLDIRECT in the last 72 hours. Thyroid Function Tests:  Recent Labs  07/23/15 0229  TSH 1.910   Anemia Panel: No results for input(s): VITAMINB12, FOLATE, FERRITIN, TIBC, IRON, RETICCTPCT in the last 72 hours. Coag Panel:   Lab Results  Component Value Date   INR 0.93 07/23/2015    RADIOLOGY: Dg Chest 2 View  07/22/2015  CLINICAL DATA:  Tightness in chest EXAM: CHEST  2 VIEW COMPARISON:  07/22/2015 FINDINGS: Heart size is normal. No pleural effusion or edema identified. No airspace consolidation. The visualized bony structures are normal. IMPRESSION: 1. No acute cardiopulmonary abnormalities. Electronically Signed   By: Kerby Moors M.D.   On: 07/22/2015 22:10   Ct Abdomen Pelvis W Contrast  07/23/2015  CLINICAL DATA:  Abdominal pain and bloating EXAM: CT ABDOMEN AND PELVIS WITH CONTRAST TECHNIQUE: Multidetector CT imaging of the abdomen and pelvis was performed using the standard protocol following bolus administration of intravenous contrast. CONTRAST:  171mL OMNIPAQUE IOHEXOL 300 MG/ML  SOLN COMPARISON:  None. FINDINGS: Lower chest and abdominal wall: Fatty umbilical hernia, small. Fatty bilateral inguinal hernia, likely both direct and indirect. Extensive coronary atherosclerotic  calcification for age Mild dependent atelectasis Hepatobiliary: No focal liver abnormality.Layering calcified cholelithiasis. There is also an anti dependent nodule which does not appear calcified and is likely a 61mm polyp. No evidence of acute cholecystitis. Pancreas: Unremarkable. Spleen: Unremarkable. Adrenals/Urinary Tract: Negative adrenals. No hydronephrosis or stone. Tiny left renal cyst. Unremarkable bladder. Reproductive:No pathologic findings. Stomach/Bowel:  No obstruction. No appendicitis. Vascular/Lymphatic: No acute vascular abnormality. No mass or adenopathy. Peritoneal: No ascites or pneumoperitoneum. Musculoskeletal: No acute abnormalities. IMPRESSION: 1. No acute finding.  2. Cholelithiasis and probable 6 mm gallbladder polyp. 3. Extensive coronary atherosclerosis. 4. Fatty inguinal and umbilical hernias. Electronically Signed   By: Monte Fantasia M.D.   On: 07/23/2015 04:35   Dg Shoulder Left  07/19/2015  CLINICAL DATA:  Left shoulder pain EXAM: LEFT SHOULDER - 2+ VIEW COMPARISON:  None. FINDINGS: Three views of the left shoulder submitted. No acute fracture or subluxation. No radiopaque foreign body. IMPRESSION: Negative. Electronically Signed   By: Lahoma Crocker M.D.   On: 07/19/2015 16:27      ASSESSMENT: / PLAN:    1) NSTEMI with severe multivessel disease including ostial LAD.  Cardiac surgery consult pending.  IV heparin for now.    Continue statin and beta blocker as well.   Jettie Booze, MD  07/24/2015  10:39 AM

## 2015-07-25 ENCOUNTER — Encounter (HOSPITAL_COMMUNITY): Payer: Self-pay | Admitting: Certified Registered Nurse Anesthetist

## 2015-07-25 ENCOUNTER — Inpatient Hospital Stay (HOSPITAL_COMMUNITY): Payer: 59

## 2015-07-25 ENCOUNTER — Inpatient Hospital Stay (HOSPITAL_COMMUNITY)
Admission: AD | Admit: 2015-07-25 | Discharge: 2015-07-25 | Disposition: A | Discharge status: Home / Self Care | Payer: 59 | Source: Other Acute Inpatient Hospital | Attending: Thoracic Surgery (Cardiothoracic Vascular Surgery) | Admitting: Thoracic Surgery (Cardiothoracic Vascular Surgery)

## 2015-07-25 ENCOUNTER — Inpatient Hospital Stay (HOSPITAL_COMMUNITY): Payer: 59 | Admitting: Certified Registered Nurse Anesthetist

## 2015-07-25 ENCOUNTER — Encounter (HOSPITAL_COMMUNITY)
Admission: AD | Disposition: A | Discharge status: Home / Self Care | Payer: 59 | Source: Other Acute Inpatient Hospital | Attending: Thoracic Surgery (Cardiothoracic Vascular Surgery)

## 2015-07-25 ENCOUNTER — Other Ambulatory Visit: Payer: Self-pay

## 2015-07-25 DIAGNOSIS — I25110 Atherosclerotic heart disease of native coronary artery with unstable angina pectoris: Secondary | ICD-10-CM

## 2015-07-25 DIAGNOSIS — Z951 Presence of aortocoronary bypass graft: Secondary | ICD-10-CM

## 2015-07-25 HISTORY — PX: CORONARY ARTERY BYPASS GRAFT: SHX141

## 2015-07-25 HISTORY — PX: TEE WITHOUT CARDIOVERSION: SHX5443

## 2015-07-25 HISTORY — PX: RADIAL ARTERY HARVEST: SHX5067

## 2015-07-25 LAB — POCT I-STAT 3, ART BLOOD GAS (G3+)
Acid-base deficit: 4 mmol/L — ABNORMAL HIGH (ref 0.0–2.0)
Acid-base deficit: 7 mmol/L — ABNORMAL HIGH (ref 0.0–2.0)
Acid-base deficit: 4 mmol/L — ABNORMAL HIGH (ref 0.0–2.0)
Bicarbonate: 24.6 mEq/L — ABNORMAL HIGH (ref 20.0–24.0)
Bicarbonate: 21.6 mEq/L (ref 20.0–24.0)
Bicarbonate: 17.9 mEq/L — ABNORMAL LOW (ref 20.0–24.0)
Bicarbonate: 20.5 mEq/L (ref 20.0–24.0)
O2 Saturation: 100 %
O2 Saturation: 96 %
O2 Saturation: 97 %
O2 Saturation: 97 %
Patient temperature: 36.2
Patient temperature: 37.2
Patient temperature: 37.8
TCO2: 26 mmol/L (ref 0–100)
TCO2: 23 mmol/L (ref 0–100)
TCO2: 19 mmol/L (ref 0–100)
TCO2: 22 mmol/L (ref 0–100)
pCO2 arterial: 38.5 mmHg (ref 35.0–45.0)
pCO2 arterial: 39.6 mmHg (ref 35.0–45.0)
pCO2 arterial: 33.9 mmHg — ABNORMAL LOW (ref 35.0–45.0)
pCO2 arterial: 36.7 mmHg (ref 35.0–45.0)
pH, Arterial: 7.413 (ref 7.350–7.450)
pH, Arterial: 7.341 — ABNORMAL LOW (ref 7.350–7.450)
pH, Arterial: 7.333 — ABNORMAL LOW (ref 7.350–7.450)
pH, Arterial: 7.358 (ref 7.350–7.450)
pO2, Arterial: 379 mmHg — ABNORMAL HIGH (ref 80.0–100.0)
pO2, Arterial: 85 mmHg (ref 80.0–100.0)
pO2, Arterial: 101 mmHg — ABNORMAL HIGH (ref 80.0–100.0)
pO2, Arterial: 94 mmHg (ref 80.0–100.0)

## 2015-07-25 LAB — HEMOGLOBIN AND HEMATOCRIT, BLOOD
HCT: 29.7 % — ABNORMAL LOW (ref 39.0–52.0)
Hemoglobin: 10.3 g/dL — ABNORMAL LOW (ref 13.0–17.0)

## 2015-07-25 LAB — HEPARIN LEVEL (UNFRACTIONATED): Heparin Unfractionated: 0.59 IU/mL (ref 0.30–0.70)

## 2015-07-25 LAB — POCT I-STAT, CHEM 8
BUN: 11 mg/dL (ref 6–20)
BUN: 11 mg/dL (ref 6–20)
BUN: 10 mg/dL (ref 6–20)
BUN: 9 mg/dL (ref 6–20)
BUN: 9 mg/dL (ref 6–20)
BUN: 8 mg/dL (ref 6–20)
BUN: 9 mg/dL (ref 6–20)
Calcium, Ion: 1.21 mmol/L (ref 1.12–1.23)
Calcium, Ion: 1.17 mmol/L (ref 1.12–1.23)
Calcium, Ion: 0.98 mmol/L — ABNORMAL LOW (ref 1.12–1.23)
Calcium, Ion: 1.18 mmol/L (ref 1.12–1.23)
Calcium, Ion: 1.08 mmol/L — ABNORMAL LOW (ref 1.12–1.23)
Calcium, Ion: 1.1 mmol/L — ABNORMAL LOW (ref 1.12–1.23)
Calcium, Ion: 1.16 mmol/L (ref 1.12–1.23)
Chloride: 103 mmol/L (ref 101–111)
Chloride: 106 mmol/L (ref 101–111)
Chloride: 100 mmol/L — ABNORMAL LOW (ref 101–111)
Chloride: 105 mmol/L (ref 101–111)
Chloride: 101 mmol/L (ref 101–111)
Chloride: 104 mmol/L (ref 101–111)
Chloride: 106 mmol/L (ref 101–111)
Creatinine, Ser: 0.7 mg/dL (ref 0.61–1.24)
Creatinine, Ser: 0.6 mg/dL — ABNORMAL LOW (ref 0.61–1.24)
Creatinine, Ser: 0.6 mg/dL — ABNORMAL LOW (ref 0.61–1.24)
Creatinine, Ser: 0.7 mg/dL (ref 0.61–1.24)
Creatinine, Ser: 0.7 mg/dL (ref 0.61–1.24)
Creatinine, Ser: 0.7 mg/dL (ref 0.61–1.24)
Creatinine, Ser: 0.9 mg/dL (ref 0.61–1.24)
Glucose, Bld: 96 mg/dL (ref 65–99)
Glucose, Bld: 116 mg/dL — ABNORMAL HIGH (ref 65–99)
Glucose, Bld: 111 mg/dL — ABNORMAL HIGH (ref 65–99)
Glucose, Bld: 115 mg/dL — ABNORMAL HIGH (ref 65–99)
Glucose, Bld: 190 mg/dL — ABNORMAL HIGH (ref 65–99)
Glucose, Bld: 170 mg/dL — ABNORMAL HIGH (ref 65–99)
Glucose, Bld: 144 mg/dL — ABNORMAL HIGH (ref 65–99)
HCT: 43 % (ref 39.0–52.0)
HCT: 37 % — ABNORMAL LOW (ref 39.0–52.0)
HCT: 32 % — ABNORMAL LOW (ref 39.0–52.0)
HCT: 37 % — ABNORMAL LOW (ref 39.0–52.0)
HCT: 31 % — ABNORMAL LOW (ref 39.0–52.0)
HCT: 32 % — ABNORMAL LOW (ref 39.0–52.0)
HCT: 31 % — ABNORMAL LOW (ref 39.0–52.0)
Hemoglobin: 14.6 g/dL (ref 13.0–17.0)
Hemoglobin: 12.6 g/dL — ABNORMAL LOW (ref 13.0–17.0)
Hemoglobin: 10.9 g/dL — ABNORMAL LOW (ref 13.0–17.0)
Hemoglobin: 12.6 g/dL — ABNORMAL LOW (ref 13.0–17.0)
Hemoglobin: 10.5 g/dL — ABNORMAL LOW (ref 13.0–17.0)
Hemoglobin: 10.9 g/dL — ABNORMAL LOW (ref 13.0–17.0)
Hemoglobin: 10.5 g/dL — ABNORMAL LOW (ref 13.0–17.0)
Potassium: 4.1 mmol/L (ref 3.5–5.1)
Potassium: 4.1 mmol/L (ref 3.5–5.1)
Potassium: 4.1 mmol/L (ref 3.5–5.1)
Potassium: 4.1 mmol/L (ref 3.5–5.1)
Potassium: 4.6 mmol/L (ref 3.5–5.1)
Potassium: 3.9 mmol/L (ref 3.5–5.1)
Potassium: 4.8 mmol/L (ref 3.5–5.1)
Sodium: 141 mmol/L (ref 135–145)
Sodium: 137 mmol/L (ref 135–145)
Sodium: 138 mmol/L (ref 135–145)
Sodium: 140 mmol/L (ref 135–145)
Sodium: 133 mmol/L — ABNORMAL LOW (ref 135–145)
Sodium: 135 mmol/L (ref 135–145)
Sodium: 139 mmol/L (ref 135–145)
TCO2: 28 mmol/L (ref 0–100)
TCO2: 26 mmol/L (ref 0–100)
TCO2: 24 mmol/L (ref 0–100)
TCO2: 26 mmol/L (ref 0–100)
TCO2: 23 mmol/L (ref 0–100)
TCO2: 22 mmol/L (ref 0–100)
TCO2: 22 mmol/L (ref 0–100)

## 2015-07-25 LAB — CREATININE, SERUM
Creatinine, Ser: 0.99 mg/dL (ref 0.61–1.24)
GFR calc Af Amer: >60 mL/min (ref >60)
GFR calc non Af Amer: >60 mL/min (ref >60)

## 2015-07-25 LAB — BASIC METABOLIC PANEL
Anion gap: 12 (ref 5–15)
BUN: 11 mg/dL (ref 6–20)
CO2: 24 mmol/L (ref 22–32)
Calcium: 9.3 mg/dL (ref 8.9–10.3)
Chloride: 102 mmol/L (ref 101–111)
Creatinine, Ser: 1 mg/dL (ref 0.61–1.24)
GFR calc Af Amer: >60 mL/min (ref >60)
GFR calc non Af Amer: >60 mL/min (ref >60)
Glucose, Bld: 105 mg/dL — ABNORMAL HIGH (ref 65–99)
Potassium: 4 mmol/L (ref 3.5–5.1)
Sodium: 138 mmol/L (ref 135–145)

## 2015-07-25 LAB — PROTIME-INR
INR: 1.35 (ref 0.00–1.49)
Prothrombin Time: 16.8 seconds — ABNORMAL HIGH (ref 11.6–15.2)

## 2015-07-25 LAB — CBC
HCT: 45.2 % (ref 39.0–52.0)
HCT: 37.6 % — ABNORMAL LOW (ref 39.0–52.0)
HCT: 31.9 % — ABNORMAL LOW (ref 39.0–52.0)
Hemoglobin: 15.6 g/dL (ref 13.0–17.0)
Hemoglobin: 12.7 g/dL — ABNORMAL LOW (ref 13.0–17.0)
Hemoglobin: 10.8 g/dL — ABNORMAL LOW (ref 13.0–17.0)
MCH: 31.8 pg (ref 26.0–34.0)
MCH: 30.9 pg (ref 26.0–34.0)
MCH: 31 pg (ref 26.0–34.0)
MCHC: 34.5 g/dL (ref 30.0–36.0)
MCHC: 33.8 g/dL (ref 30.0–36.0)
MCHC: 33.9 g/dL (ref 30.0–36.0)
MCV: 92.2 fL (ref 78.0–100.0)
MCV: 91.5 fL (ref 78.0–100.0)
MCV: 91.7 fL (ref 78.0–100.0)
Platelets: 251 10*3/uL (ref 150–400)
Platelets: 171 10*3/uL (ref 150–400)
Platelets: 184 10*3/uL (ref 150–400)
RBC: 4.9 MIL/uL (ref 4.22–5.81)
RBC: 4.11 MIL/uL — ABNORMAL LOW (ref 4.22–5.81)
RBC: 3.48 MIL/uL — ABNORMAL LOW (ref 4.22–5.81)
RDW: 13 % (ref 11.5–15.5)
RDW: 13 % (ref 11.5–15.5)
RDW: 13 % (ref 11.5–15.5)
WBC: 8.1 10*3/uL (ref 4.0–10.5)
WBC: 15.2 10*3/uL — ABNORMAL HIGH (ref 4.0–10.5)
WBC: 13.3 10*3/uL — ABNORMAL HIGH (ref 4.0–10.5)

## 2015-07-25 LAB — GLUCOSE, CAPILLARY
Glucose-Capillary: 96 mg/dL (ref 65–99)
Glucose-Capillary: 77 mg/dL (ref 65–99)
Glucose-Capillary: 104 mg/dL — ABNORMAL HIGH (ref 65–99)
Glucose-Capillary: 131 mg/dL — ABNORMAL HIGH (ref 65–99)

## 2015-07-25 LAB — POCT I-STAT 4, (NA,K, GLUC, HGB,HCT)
Glucose, Bld: 113 mg/dL — ABNORMAL HIGH (ref 65–99)
HCT: 37 % — ABNORMAL LOW (ref 39.0–52.0)
Hemoglobin: 12.6 g/dL — ABNORMAL LOW (ref 13.0–17.0)
Potassium: 3.6 mmol/L (ref 3.5–5.1)
Sodium: 139 mmol/L (ref 135–145)

## 2015-07-25 LAB — APTT: aPTT: 28 seconds (ref 24–37)

## 2015-07-25 LAB — MAGNESIUM: Magnesium: 3.7 mg/dL — ABNORMAL HIGH (ref 1.7–2.4)

## 2015-07-25 LAB — PLATELET COUNT: Platelets: 192 10*3/uL (ref 150–400)

## 2015-07-25 SURGERY — CORONARY ARTERY BYPASS GRAFTING (CABG)
Anesthesia: General | Site: Chest

## 2015-07-25 MED ORDER — ACETAMINOPHEN 500 MG PO TABS
1000.0000 mg | ORAL_TABLET | Freq: Four times a day (QID) | ORAL | Status: AC
  Administered 2015-07-26 – 2015-07-30 (×18): 1000 mg via ORAL
  Filled 2015-07-25 (×17): qty 2

## 2015-07-25 MED ORDER — PHENYLEPHRINE HCL 10 MG/ML IJ SOLN
0.0000 ug/min | INTRAVENOUS | Status: DC
  Filled 2015-07-25 (×2): qty 2

## 2015-07-25 MED ORDER — MAGNESIUM SULFATE 4 GM/100ML IV SOLN
4.0000 g | Freq: Once | INTRAVENOUS | Status: AC
  Administered 2015-07-25: 4 g via INTRAVENOUS
  Filled 2015-07-25: qty 100

## 2015-07-25 MED ORDER — LACTATED RINGERS IV SOLN
INTRAVENOUS | Status: DC | PRN
  Administered 2015-07-25: 08:00:00 via INTRAVENOUS

## 2015-07-25 MED ORDER — SODIUM BICARBONATE 8.4 % IV SOLN
50.0000 meq | Freq: Once | INTRAVENOUS | Status: AC
  Administered 2015-07-25: 50 meq via INTRAVENOUS

## 2015-07-25 MED ORDER — ONDANSETRON HCL 4 MG/2ML IJ SOLN: 4.0000 mg | Freq: Four times a day (QID) | INTRAMUSCULAR | Status: DC | PRN

## 2015-07-25 MED ORDER — SODIUM CHLORIDE 0.45 % IV SOLN
INTRAVENOUS | Status: DC | PRN
  Administered 2015-07-25: 15:00:00 via INTRAVENOUS

## 2015-07-25 MED ORDER — LACTATED RINGERS IV SOLN
INTRAVENOUS | Status: DC
  Administered 2015-07-25 (×2): via INTRAVENOUS

## 2015-07-25 MED ORDER — LACTATED RINGERS IV SOLN
INTRAVENOUS | Status: DC
  Administered 2015-07-25: 16:00:00 via INTRAVENOUS

## 2015-07-25 MED ORDER — EPHEDRINE SULFATE 50 MG/ML IJ SOLN
INTRAMUSCULAR | Status: AC
  Filled 2015-07-25: qty 1

## 2015-07-25 MED ORDER — ALBUMIN HUMAN 5 % IV SOLN
INTRAVENOUS | Status: DC | PRN
  Administered 2015-07-25: 13:00:00 via INTRAVENOUS

## 2015-07-25 MED ORDER — PHENYLEPHRINE HCL 10 MG/ML IJ SOLN
20.0000 mg | INTRAVENOUS | Status: DC | PRN
  Administered 2015-07-25: 10 ug/min via INTRAVENOUS

## 2015-07-25 MED ORDER — FENTANYL CITRATE (PF) 250 MCG/5ML IJ SOLN
INTRAMUSCULAR | Status: AC
  Filled 2015-07-25: qty 25

## 2015-07-25 MED ORDER — DEXMEDETOMIDINE HCL IN NACL 200 MCG/50ML IV SOLN: 0.0000 ug/kg/h | INTRAVENOUS | Status: DC

## 2015-07-25 MED ORDER — HEPARIN SODIUM (PORCINE) 1000 UNIT/ML IJ SOLN
INTRAMUSCULAR | Status: AC
  Filled 2015-07-25: qty 1

## 2015-07-25 MED ORDER — STERILE WATER FOR INJECTION IJ SOLN
INTRAMUSCULAR | Status: AC
Start: 2015-07-25 — End: 2015-07-25
  Filled 2015-07-25: qty 30

## 2015-07-25 MED ORDER — SODIUM CHLORIDE 0.9% FLUSH
3.0000 mL | Freq: Two times a day (BID) | INTRAVENOUS | Status: DC
  Administered 2015-07-26 – 2015-07-29 (×6): 3 mL via INTRAVENOUS

## 2015-07-25 MED ORDER — POTASSIUM CHLORIDE 10 MEQ/50ML IV SOLN
10.0000 meq | INTRAVENOUS | Status: AC
  Administered 2015-07-25 (×3): 10 meq via INTRAVENOUS

## 2015-07-25 MED ORDER — SODIUM CHLORIDE 0.9 % IJ SOLN
INTRAMUSCULAR | Status: AC
  Filled 2015-07-25: qty 40

## 2015-07-25 MED ORDER — CHLORHEXIDINE GLUCONATE 0.12 % MT SOLN
15.0000 mL | OROMUCOSAL | Status: AC
  Administered 2015-07-25: 15 mL via OROMUCOSAL

## 2015-07-25 MED ORDER — PROPOFOL 10 MG/ML IV BOLUS
INTRAVENOUS | Status: AC
  Filled 2015-07-25: qty 20

## 2015-07-25 MED ORDER — SODIUM CHLORIDE 0.9% FLUSH
10.0000 mL | Freq: Two times a day (BID) | INTRAVENOUS | Status: DC
  Administered 2015-07-25 – 2015-07-27 (×2): 10 mL
  Administered 2015-07-27: 20 mL

## 2015-07-25 MED ORDER — NITROGLYCERIN IN D5W 200-5 MCG/ML-% IV SOLN
7.0000 ug/min | INTRAVENOUS | Status: AC
  Administered 2015-07-25: 5 ug/min via INTRAVENOUS

## 2015-07-25 MED ORDER — SODIUM CHLORIDE 0.9 % IJ SOLN
OROMUCOSAL | Status: DC | PRN
  Administered 2015-07-25: 12 mL via TOPICAL

## 2015-07-25 MED ORDER — ACETAMINOPHEN 160 MG/5ML PO SOLN: 1000.0000 mg | Freq: Four times a day (QID) | ORAL | Status: AC

## 2015-07-25 MED ORDER — PHENYLEPHRINE HCL 10 MG/ML IJ SOLN
10.0000 mg | INTRAVENOUS | Status: DC | PRN
  Administered 2015-07-25: 20 ug/min via INTRAVENOUS

## 2015-07-25 MED ORDER — LACTATED RINGERS IV SOLN
INTRAVENOUS | Status: DC | PRN
  Administered 2015-07-25: 09:00:00 via INTRAVENOUS

## 2015-07-25 MED ORDER — PROTAMINE SULFATE 10 MG/ML IV SOLN
INTRAVENOUS | Status: AC
Start: 2015-07-25 — End: 2015-07-25
  Filled 2015-07-25: qty 50

## 2015-07-25 MED ORDER — HEPARIN SODIUM (PORCINE) 1000 UNIT/ML IJ SOLN
INTRAMUSCULAR | Status: DC | PRN
  Administered 2015-07-25: 35000 [IU] via INTRAVENOUS
  Administered 2015-07-25: 2000 [IU] via INTRAVENOUS

## 2015-07-25 MED ORDER — ACETAMINOPHEN 160 MG/5ML PO SOLN: 650.0000 mg | Freq: Once | ORAL | Status: AC

## 2015-07-25 MED ORDER — SUCCINYLCHOLINE CHLORIDE 20 MG/ML IJ SOLN
INTRAMUSCULAR | Status: AC
  Filled 2015-07-25: qty 1

## 2015-07-25 MED ORDER — MIDAZOLAM HCL 10 MG/2ML IJ SOLN
INTRAMUSCULAR | Status: AC
  Filled 2015-07-25: qty 2

## 2015-07-25 MED ORDER — MIDAZOLAM HCL 2 MG/2ML IJ SOLN: 2.0000 mg | INTRAMUSCULAR | Status: DC | PRN

## 2015-07-25 MED ORDER — PROTAMINE SULFATE 10 MG/ML IV SOLN
INTRAVENOUS | Status: DC | PRN
  Administered 2015-07-25: 50 mg via INTRAVENOUS
  Administered 2015-07-25: 40 mg via INTRAVENOUS
  Administered 2015-07-25: 20 mg via INTRAVENOUS
  Administered 2015-07-25 (×2): 30 mg via INTRAVENOUS
  Administered 2015-07-25: 50 mg via INTRAVENOUS
  Administered 2015-07-25: 30 mg via INTRAVENOUS
  Administered 2015-07-25: 50 mg via INTRAVENOUS

## 2015-07-25 MED ORDER — ACETAMINOPHEN 650 MG RE SUPP
650.0000 mg | Freq: Once | RECTAL | Status: AC
  Administered 2015-07-25: 650 mg via RECTAL

## 2015-07-25 MED ORDER — PANTOPRAZOLE SODIUM 40 MG PO TBEC
40.0000 mg | DELAYED_RELEASE_TABLET | Freq: Every day | ORAL | Status: DC
  Administered 2015-07-27 – 2015-07-31 (×5): 40 mg via ORAL
  Filled 2015-07-25 (×5): qty 1

## 2015-07-25 MED ORDER — PROPOFOL 10 MG/ML IV BOLUS
INTRAVENOUS | Status: DC | PRN
  Administered 2015-07-25: 40 mg via INTRAVENOUS
  Administered 2015-07-25: 30 mg via INTRAVENOUS
  Administered 2015-07-25: 40 mg via INTRAVENOUS
  Administered 2015-07-25: 30 mg via INTRAVENOUS

## 2015-07-25 MED ORDER — METOPROLOL TARTRATE 12.5 MG HALF TABLET
12.5000 mg | ORAL_TABLET | Freq: Two times a day (BID) | ORAL | Status: DC
  Administered 2015-07-26 – 2015-07-28 (×5): 12.5 mg via ORAL
  Filled 2015-07-25 (×5): qty 1

## 2015-07-25 MED ORDER — FENTANYL CITRATE (PF) 250 MCG/5ML IJ SOLN
INTRAMUSCULAR | Status: AC
  Filled 2015-07-25: qty 5

## 2015-07-25 MED ORDER — PHENYLEPHRINE HCL 10 MG/ML IJ SOLN
INTRAMUSCULAR | Status: DC | PRN
  Administered 2015-07-25: 80 ug via INTRAVENOUS

## 2015-07-25 MED ORDER — ARTIFICIAL TEARS OP OINT
TOPICAL_OINTMENT | OPHTHALMIC | Status: DC | PRN
  Administered 2015-07-25: 1 via OPHTHALMIC

## 2015-07-25 MED ORDER — 0.9 % SODIUM CHLORIDE (POUR BTL) OPTIME
TOPICAL | Status: DC | PRN
  Administered 2015-07-25: 5000 mL

## 2015-07-25 MED ORDER — MORPHINE SULFATE (PF) 2 MG/ML IV SOLN
1.0000 mg | INTRAVENOUS | Status: AC | PRN
  Administered 2015-07-25 (×2): 2 mg via INTRAVENOUS

## 2015-07-25 MED ORDER — LIDOCAINE HCL (CARDIAC) 20 MG/ML IV SOLN
INTRAVENOUS | Status: AC
  Filled 2015-07-25: qty 10

## 2015-07-25 MED ORDER — ROCURONIUM BROMIDE 100 MG/10ML IV SOLN
INTRAVENOUS | Status: DC | PRN
  Administered 2015-07-25 (×2): 100 mg via INTRAVENOUS
  Administered 2015-07-25: 50 mg via INTRAVENOUS

## 2015-07-25 MED ORDER — INSULIN REGULAR BOLUS VIA INFUSION
0.0000 [IU] | Freq: Three times a day (TID) | INTRAVENOUS | Status: DC
  Filled 2015-07-25: qty 10

## 2015-07-25 MED ORDER — SODIUM CHLORIDE 0.9% FLUSH
10.0000 mL | INTRAVENOUS | Status: DC | PRN
  Administered 2015-07-25: 10 mL
  Filled 2015-07-25: qty 40

## 2015-07-25 MED ORDER — FENTANYL CITRATE (PF) 100 MCG/2ML IJ SOLN
INTRAMUSCULAR | Status: DC | PRN
  Administered 2015-07-25 (×3): 100 ug via INTRAVENOUS
  Administered 2015-07-25: 50 ug via INTRAVENOUS
  Administered 2015-07-25 (×8): 100 ug via INTRAVENOUS
  Administered 2015-07-25: 200 ug via INTRAVENOUS
  Administered 2015-07-25: 50 ug via INTRAVENOUS
  Administered 2015-07-25: 100 ug via INTRAVENOUS

## 2015-07-25 MED ORDER — BISACODYL 5 MG PO TBEC
10.0000 mg | DELAYED_RELEASE_TABLET | Freq: Every day | ORAL | Status: DC
  Administered 2015-07-26 – 2015-07-31 (×6): 10 mg via ORAL
  Filled 2015-07-25 (×7): qty 2

## 2015-07-25 MED ORDER — POTASSIUM CHLORIDE 10 MEQ/50ML IV SOLN
10.0000 meq | Freq: Once | INTRAVENOUS | Status: AC
Start: 2015-07-25 — End: 2015-07-25
  Administered 2015-07-25: 10 meq via INTRAVENOUS

## 2015-07-25 MED ORDER — MIDAZOLAM HCL 5 MG/5ML IJ SOLN
INTRAMUSCULAR | Status: DC | PRN
  Administered 2015-07-25: 1 mg via INTRAVENOUS
  Administered 2015-07-25: 2 mg via INTRAVENOUS
  Administered 2015-07-25: 3 mg via INTRAVENOUS
  Administered 2015-07-25: 1 mg via INTRAVENOUS
  Administered 2015-07-25: 2 mg via INTRAVENOUS
  Administered 2015-07-25: 1 mg via INTRAVENOUS
  Administered 2015-07-25: 3 mg via INTRAVENOUS
  Administered 2015-07-25: 2 mg via INTRAVENOUS
  Administered 2015-07-25: 3 mg via INTRAVENOUS
  Administered 2015-07-25: 2 mg via INTRAVENOUS

## 2015-07-25 MED ORDER — ROCURONIUM BROMIDE 50 MG/5ML IV SOLN
INTRAVENOUS | Status: AC
  Filled 2015-07-25: qty 1

## 2015-07-25 MED ORDER — METOPROLOL TARTRATE 1 MG/ML IV SOLN: 2.5000 mg | INTRAVENOUS | Status: DC | PRN

## 2015-07-25 MED ORDER — INSULIN ASPART 100 UNIT/ML ~~LOC~~ SOLN
0.0000 [IU] | SUBCUTANEOUS | Status: DC
  Administered 2015-07-25 – 2015-07-26 (×2): 2 [IU] via SUBCUTANEOUS

## 2015-07-25 MED ORDER — PHENYLEPHRINE 40 MCG/ML (10ML) SYRINGE FOR IV PUSH (FOR BLOOD PRESSURE SUPPORT)
PREFILLED_SYRINGE | INTRAVENOUS | Status: AC
  Filled 2015-07-25: qty 10

## 2015-07-25 MED ORDER — PROTAMINE SULFATE 10 MG/ML IV SOLN
INTRAVENOUS | Status: AC
  Filled 2015-07-25: qty 10

## 2015-07-25 MED ORDER — METOPROLOL TARTRATE 25 MG/10 ML ORAL SUSPENSION: 12.5000 mg | Freq: Two times a day (BID) | ORAL | Status: DC

## 2015-07-25 MED ORDER — DOCUSATE SODIUM 100 MG PO CAPS
200.0000 mg | ORAL_CAPSULE | Freq: Every day | ORAL | Status: DC
  Administered 2015-07-26 – 2015-07-31 (×6): 200 mg via ORAL
  Filled 2015-07-25 (×6): qty 2

## 2015-07-25 MED ORDER — ASPIRIN EC 325 MG PO TBEC
325.0000 mg | DELAYED_RELEASE_TABLET | Freq: Every day | ORAL | Status: DC
  Administered 2015-07-26 – 2015-07-31 (×5): 325 mg via ORAL
  Filled 2015-07-25 (×5): qty 1

## 2015-07-25 MED ORDER — SODIUM CHLORIDE 0.9% FLUSH: 3.0000 mL | INTRAVENOUS | Status: DC | PRN

## 2015-07-25 MED ORDER — ETOMIDATE 2 MG/ML IV SOLN
INTRAVENOUS | Status: AC
  Filled 2015-07-25: qty 20

## 2015-07-25 MED ORDER — SODIUM CHLORIDE 0.9 % IV SOLN
INTRAVENOUS | Status: DC
  Administered 2015-07-25: 16:00:00 via INTRAVENOUS

## 2015-07-25 MED ORDER — MORPHINE SULFATE (PF) 2 MG/ML IV SOLN
2.0000 mg | INTRAVENOUS | Status: DC | PRN
  Administered 2015-07-25 – 2015-07-27 (×6): 2 mg via INTRAVENOUS
  Filled 2015-07-25: qty 1
  Filled 2015-07-25: qty 2
  Filled 2015-07-25 (×5): qty 1

## 2015-07-25 MED ORDER — OXYCODONE HCL 5 MG PO TABS
5.0000 mg | ORAL_TABLET | ORAL | Status: DC | PRN
  Administered 2015-07-26 – 2015-07-27 (×5): 10 mg via ORAL
  Administered 2015-07-27: 5 mg via ORAL
  Administered 2015-07-27 – 2015-07-30 (×2): 10 mg via ORAL
  Filled 2015-07-25: qty 1
  Filled 2015-07-25 (×7): qty 2

## 2015-07-25 MED ORDER — ASPIRIN 81 MG PO CHEW
324.0000 mg | CHEWABLE_TABLET | Freq: Every day | ORAL | Status: DC
  Administered 2015-07-28: 324 mg
  Filled 2015-07-25 (×2): qty 4

## 2015-07-25 MED ORDER — ALBUMIN HUMAN 5 % IV SOLN
250.0000 mL | INTRAVENOUS | Status: AC | PRN
  Administered 2015-07-25 (×3): 250 mL via INTRAVENOUS
  Filled 2015-07-25: qty 250

## 2015-07-25 MED ORDER — SODIUM CHLORIDE 0.9 % IV SOLN
INTRAVENOUS | Status: DC
  Filled 2015-07-25: qty 2.5

## 2015-07-25 MED ORDER — LACTATED RINGERS IV SOLN: 500.0000 mL | Freq: Once | INTRAVENOUS | Status: DC | PRN

## 2015-07-25 MED ORDER — SODIUM CHLORIDE 0.9 % IV SOLN
250.0000 mL | INTRAVENOUS | Status: DC
  Administered 2015-07-26: 250 mL via INTRAVENOUS

## 2015-07-25 MED ORDER — TRAMADOL HCL 50 MG PO TABS
50.0000 mg | ORAL_TABLET | ORAL | Status: DC | PRN
  Administered 2015-07-28 (×2): 100 mg via ORAL
  Filled 2015-07-25 (×2): qty 2

## 2015-07-25 MED ORDER — VANCOMYCIN HCL IN DEXTROSE 1-5 GM/200ML-% IV SOLN
1000.0000 mg | Freq: Once | INTRAVENOUS | Status: AC
Start: 2015-07-25 — End: 2015-07-25
  Administered 2015-07-25: 1000 mg via INTRAVENOUS
  Filled 2015-07-25: qty 200

## 2015-07-25 MED ORDER — FAMOTIDINE IN NACL 20-0.9 MG/50ML-% IV SOLN
20.0000 mg | Freq: Two times a day (BID) | INTRAVENOUS | Status: AC
  Administered 2015-07-25: 20 mg via INTRAVENOUS

## 2015-07-25 MED ORDER — SODIUM CHLORIDE 0.9 % IV SOLN
INTRAVENOUS | Status: DC | PRN
  Administered 2015-07-25: 07:00:00 via INTRAVENOUS

## 2015-07-25 MED ORDER — BISACODYL 10 MG RE SUPP
10.0000 mg | Freq: Every day | RECTAL | Status: DC
  Filled 2015-07-25 (×2): qty 1

## 2015-07-25 MED ORDER — HEMOSTATIC AGENTS (NO CHARGE) OPTIME
TOPICAL | Status: DC | PRN
  Administered 2015-07-25: 1 via TOPICAL

## 2015-07-25 MED ORDER — DEXTROSE 5 % IV SOLN
1.5000 g | Freq: Two times a day (BID) | INTRAVENOUS | Status: AC
  Administered 2015-07-25 – 2015-07-27 (×4): 1.5 g via INTRAVENOUS
  Filled 2015-07-25 (×4): qty 1.5

## 2015-07-25 MED ORDER — ROCURONIUM BROMIDE 50 MG/5ML IV SOLN
INTRAVENOUS | Status: AC
  Filled 2015-07-25: qty 3

## 2015-07-25 MED FILL — Lidocaine HCl IV Inj 20 MG/ML: INTRAVENOUS | Qty: 5 | Status: AC

## 2015-07-25 MED FILL — Heparin Sodium (Porcine) Inj 1000 Unit/ML: INTRAMUSCULAR | Qty: 10 | Status: AC

## 2015-07-25 MED FILL — Potassium Chloride Inj 2 mEq/ML: INTRAVENOUS | Qty: 40 | Status: AC

## 2015-07-25 MED FILL — Mannitol IV Soln 20%: INTRAVENOUS | Qty: 500 | Status: AC

## 2015-07-25 MED FILL — Sodium Chloride IV Soln 0.9%: INTRAVENOUS | Qty: 2000 | Status: AC

## 2015-07-25 MED FILL — Heparin Sodium (Porcine) Inj 1000 Unit/ML: INTRAMUSCULAR | Qty: 30 | Status: AC

## 2015-07-25 MED FILL — Magnesium Sulfate Inj 50%: INTRAMUSCULAR | Qty: 10 | Status: AC

## 2015-07-25 MED FILL — Electrolyte-R (PH 7.4) Solution: INTRAVENOUS | Qty: 3000 | Status: AC

## 2015-07-25 MED FILL — Sodium Bicarbonate IV Soln 8.4%: INTRAVENOUS | Qty: 50 | Status: AC

## 2015-07-25 SURGICAL SUPPLY — 99 items
APPLIER CLIP 9.375 SM OPEN (CLIP)
BAG DECANTER FOR FLEXI CONT (MISCELLANEOUS) ×4 IMPLANT
BANDAGE ACE 4X5 VEL STRL LF (GAUZE/BANDAGES/DRESSINGS) ×4 IMPLANT
BANDAGE ACE 6X5 VEL STRL LF (GAUZE/BANDAGES/DRESSINGS) ×4 IMPLANT
BANDAGE ELASTIC 4 VELCRO ST LF (GAUZE/BANDAGES/DRESSINGS) ×4 IMPLANT
BANDAGE ELASTIC 6 VELCRO ST LF (GAUZE/BANDAGES/DRESSINGS) ×4 IMPLANT
BASKET HEART (ORDER IN 25'S) (MISCELLANEOUS) ×1
BASKET HEART (ORDER IN 25S) (MISCELLANEOUS) ×3 IMPLANT
BENZOIN TINCTURE PRP APPL 2/3 (GAUZE/BANDAGES/DRESSINGS) ×4 IMPLANT
BLADE STERNUM SYSTEM 6 (BLADE) ×4 IMPLANT
BLADE SURG 15 STRL LF DISP TIS (BLADE) IMPLANT
BLADE SURG 15 STRL SS (BLADE)
BNDG GAUZE ELAST 4 BULKY (GAUZE/BANDAGES/DRESSINGS) ×4 IMPLANT
CANISTER SUCTION 2500CC (MISCELLANEOUS) ×4 IMPLANT
CANNULA EZ GLIDE AORTIC 21FR (CANNULA) ×4 IMPLANT
CATH CPB KIT HENDRICKSON (MISCELLANEOUS) ×4 IMPLANT
CATH ROBINSON RED A/P 18FR (CATHETERS) ×4 IMPLANT
CATH THORACIC 36FR (CATHETERS) ×4 IMPLANT
CATH THORACIC 36FR RT ANG (CATHETERS) ×4 IMPLANT
CLIP APPLIE 9.375 SM OPEN (CLIP) IMPLANT
CLIP TI MEDIUM 24 (CLIP) IMPLANT
CLIP TI WIDE RED SMALL 24 (CLIP) ×8 IMPLANT
COVER MAYO STAND STRL (DRAPES) IMPLANT
CRADLE DONUT ADULT HEAD (MISCELLANEOUS) ×4 IMPLANT
DRAPE CARDIOVASCULAR INCISE (DRAPES) ×1
DRAPE EXTREMITY T 121X128X90 (DRAPE) IMPLANT
DRAPE PROXIMA HALF (DRAPES) ×8 IMPLANT
DRAPE SLUSH/WARMER DISC (DRAPES) ×4 IMPLANT
DRAPE SRG 135X102X78XABS (DRAPES) ×3 IMPLANT
DRSG COVADERM 4X14 (GAUZE/BANDAGES/DRESSINGS) ×4 IMPLANT
DRSG KUZMA FLUFF (GAUZE/BANDAGES/DRESSINGS) ×4 IMPLANT
ELECT REM PT RETURN 9FT ADLT (ELECTROSURGICAL) ×8
ELECTRODE REM PT RTRN 9FT ADLT (ELECTROSURGICAL) ×6 IMPLANT
GAUZE SPONGE 4X4 12PLY STRL (GAUZE/BANDAGES/DRESSINGS) ×8 IMPLANT
GEL ULTRASOUND 20GR AQUASONIC (MISCELLANEOUS) IMPLANT
GLOVE BIO SURGEON STRL SZ 6 (GLOVE) ×16 IMPLANT
GLOVE BIO SURGEON STRL SZ 6.5 (GLOVE) ×8 IMPLANT
GLOVE BIO SURGEON STRL SZ7.5 (GLOVE) ×4 IMPLANT
GLOVE BIOGEL PI IND STRL 6.5 (GLOVE) ×15 IMPLANT
GLOVE BIOGEL PI INDICATOR 6.5 (GLOVE) ×5
GLOVE SURG SIGNA 7.5 PF LTX (GLOVE) ×20 IMPLANT
GOWN STRL REUS W/ TWL LRG LVL3 (GOWN DISPOSABLE) ×12 IMPLANT
GOWN STRL REUS W/ TWL XL LVL3 (GOWN DISPOSABLE) ×6 IMPLANT
GOWN STRL REUS W/TWL LRG LVL3 (GOWN DISPOSABLE) ×4
GOWN STRL REUS W/TWL XL LVL3 (GOWN DISPOSABLE) ×2
HARMONIC SHEARS 14CM COAG (MISCELLANEOUS) ×4 IMPLANT
HEMOSTAT POWDER SURGIFOAM 1G (HEMOSTASIS) ×12 IMPLANT
HEMOSTAT SURGICEL 2X14 (HEMOSTASIS) ×4 IMPLANT
INSERT FOGARTY XLG (MISCELLANEOUS) IMPLANT
KIT BASIN OR (CUSTOM PROCEDURE TRAY) ×4 IMPLANT
KIT ROOM TURNOVER OR (KITS) ×4 IMPLANT
KIT SUCTION CATH 14FR (SUCTIONS) ×8 IMPLANT
KIT VASOVIEW W/TROCAR VH 2000 (KITS) ×4 IMPLANT
MARKER GRAFT CORONARY BYPASS (MISCELLANEOUS) ×12 IMPLANT
NS IRRIG 1000ML POUR BTL (IV SOLUTION) ×20 IMPLANT
PACK OPEN HEART (CUSTOM PROCEDURE TRAY) ×4 IMPLANT
PAD ARMBOARD 7.5X6 YLW CONV (MISCELLANEOUS) ×8 IMPLANT
PAD ELECT DEFIB RADIOL ZOLL (MISCELLANEOUS) ×4 IMPLANT
PENCIL BUTTON HOLSTER BLD 10FT (ELECTRODE) ×4 IMPLANT
PUNCH AORTIC ROT 4.0MM RCL 40 (MISCELLANEOUS) ×4 IMPLANT
PUNCH AORTIC ROTATE 4.0MM (MISCELLANEOUS) IMPLANT
PUNCH AORTIC ROTATE 4.5MM 8IN (MISCELLANEOUS) IMPLANT
PUNCH AORTIC ROTATE 5MM 8IN (MISCELLANEOUS) IMPLANT
SET CARDIOPLEGIA MPS 5001102 (MISCELLANEOUS) ×4 IMPLANT
SPONGE LAP 18X18 X RAY DECT (DISPOSABLE) ×8 IMPLANT
SPONGE LAP 4X18 X RAY DECT (DISPOSABLE) ×8 IMPLANT
SUT BONE WAX W31G (SUTURE) ×4 IMPLANT
SUT MNCRL AB 4-0 PS2 18 (SUTURE) IMPLANT
SUT PROLENE 3 0 SH DA (SUTURE) ×4 IMPLANT
SUT PROLENE 4 0 RB 1 (SUTURE) ×2
SUT PROLENE 4 0 SH DA (SUTURE) IMPLANT
SUT PROLENE 4-0 RB1 .5 CRCL 36 (SUTURE) ×6 IMPLANT
SUT PROLENE 6 0 C 1 30 (SUTURE) ×8 IMPLANT
SUT PROLENE 7 0 BV1 MDA (SUTURE) ×4 IMPLANT
SUT PROLENE 8 0 BV175 6 (SUTURE) ×24 IMPLANT
SUT STEEL 6MS V (SUTURE) ×4 IMPLANT
SUT STEEL STERNAL CCS#1 18IN (SUTURE) IMPLANT
SUT STEEL SZ 6 DBL 3X14 BALL (SUTURE) ×4 IMPLANT
SUT VIC AB 1 CTX 36 (SUTURE) ×2
SUT VIC AB 1 CTX36XBRD ANBCTR (SUTURE) ×6 IMPLANT
SUT VIC AB 2-0 CT1 27 (SUTURE) ×1
SUT VIC AB 2-0 CT1 TAPERPNT 27 (SUTURE) ×3 IMPLANT
SUT VIC AB 2-0 CTX 27 (SUTURE) IMPLANT
SUT VIC AB 3-0 SH 27 (SUTURE)
SUT VIC AB 3-0 SH 27X BRD (SUTURE) IMPLANT
SUT VIC AB 3-0 X1 27 (SUTURE) ×4 IMPLANT
SUT VICRYL 4-0 PS2 18IN ABS (SUTURE) IMPLANT
SUTURE E-PAK OPEN HEART (SUTURE) ×4 IMPLANT
SYR 50ML SLIP (SYRINGE) IMPLANT
SYSTEM SAHARA CHEST DRAIN ATS (WOUND CARE) ×4 IMPLANT
TAPE CLOTH SURG 4X10 WHT LF (GAUZE/BANDAGES/DRESSINGS) ×4 IMPLANT
TAPE PAPER 2X10 WHT MICROPORE (GAUZE/BANDAGES/DRESSINGS) ×4 IMPLANT
TOWEL OR 17X24 6PK STRL BLUE (TOWEL DISPOSABLE) ×8 IMPLANT
TOWEL OR 17X26 10 PK STRL BLUE (TOWEL DISPOSABLE) ×8 IMPLANT
TRAY FOLEY IC TEMP SENS 16FR (CATHETERS) ×4 IMPLANT
TUBE FEEDING 8FR 16IN STR KANG (MISCELLANEOUS) ×4 IMPLANT
TUBING INSUFFLATION (TUBING) ×4 IMPLANT
UNDERPAD 30X30 INCONTINENT (UNDERPADS AND DIAPERS) ×4 IMPLANT
WATER STERILE IRR 1000ML POUR (IV SOLUTION) ×8 IMPLANT

## 2015-07-25 NOTE — Transfer of Care (Signed)
Immediate Anesthesia Transfer of Care Note  Patient: BAELIN PETROSSIAN  Procedure(s) Performed: Procedure(s): CORONARY ARTERY BYPASS GRAFTING X 3 UTILIZING BILATERAL IMA AND LEFT RADIAL ARTERY (N/A) RADIAL ARTERY HARVEST (Left) TRANSESOPHAGEAL ECHOCARDIOGRAM (TEE) (N/A)  Patient Location: SICU  Anesthesia Type:General  Level of Consciousness: sedated, unresponsive and Patient remains intubated per anesthesia plan  Airway & Oxygen Therapy: Patient remains intubated per anesthesia plan and Patient placed on Ventilator (see vital sign flow sheet for setting)  Post-op Assessment: Report given to RN and Post -op Vital signs reviewed and stable  Post vital signs: Reviewed and stable  Last Vitals:  Filed Vitals:   07/25/15 0438 07/25/15 0610  BP: 122/86 121/96  Pulse: 76 85  Temp: 36.4 C   Resp: 13 19    Complications: No apparent anesthesia complications

## 2015-07-25 NOTE — Plan of Care (Signed)
Problem: Cardiac: Goal: Hemodynamic stability will improve Outcome: Progressing AAI 86/ NSR. PADs 13-20. Albumin x2 given. Pn NTG at 7 mcg/min and phenylephrine 10-15 mcg/ml. CI > 2.0.

## 2015-07-25 NOTE — Progress Notes (Signed)
  Echocardiogram Echocardiogram Transesophageal has been performed.  Jennette Dubin 07/25/2015, 9:04 AM

## 2015-07-25 NOTE — OR Nursing (Signed)
SICU Calls: Ist call @ 1303; 2nd call @ 1321; 3rd call @ 1336 and Final call @1400          Trina Asch,RN

## 2015-07-25 NOTE — Interval H&P Note (Signed)
History and Physical Interval Note:  07/25/2015 7:53 AM  Jeffrey Spence  has presented today for surgery, with the diagnosis of CAD LEFT MAIN DISEASE  The various methods of treatment have been discussed with the patient and family. After consideration of risks, benefits and other options for treatment, the patient has consented to  Procedure(s): CORONARY ARTERY BYPASS GRAFTING WITH BILATERAL IMA (N/A) RADIAL ARTERY HARVEST (Left) TRANSESOPHAGEAL ECHOCARDIOGRAM (TEE) (N/A) as a surgical intervention .  The patient's history has been reviewed, patient examined, no change in status, stable for surgery.  I have reviewed the patient's chart and labs.  Questions were answered to the patient's satisfaction.     Melrose Nakayama

## 2015-07-25 NOTE — Progress Notes (Signed)
      [address]       [place] [address]             [phone]      Just extubated  BP 82/55 mmHg  Pulse 86  Temp(Src) 98.8 F (37.1 C) (Core (Comment))  Resp 19  Ht 5\' 8"  (1.727 m)  Wt 171 lb 8.3 oz (77.8 kg)  BMI 26.09 kg/m2  SpO2 100%  Ap at 90. CI= 2.4   Intake/Output Summary (Last 24 hours) at 07/25/15 1732 Last data filed at 07/25/15 1700  Gross per 24 hour  Intake 4510.31 ml  Output   4760 ml  Net -249.69 ml   Swan coiled on CXR- refloated into a good position  K= 3.6 - given 4 runs HCT= 37  Doing well early postop  Remo Lipps C. Roxan Hockey, MD [facility] [phone]

## 2015-07-25 NOTE — Anesthesia Preprocedure Evaluation (Addendum)
Anesthesia Evaluation  Patient identified by MRN, date of birth, ID band Patient awake    Reviewed: Allergy & Precautions, NPO status , Patient's Chart, lab work & pertinent test results, reviewed documented beta blocker date and time   History of Anesthesia Complications Negative for: history of anesthetic complications  Airway Mallampati: III  TM Distance: <3 FB Neck ROM: Full    Dental  (+) Teeth Intact, Dental Advisory Given   Pulmonary neg shortness of breath, neg sleep apnea, neg COPD, neg recent URI, Current Smoker, neg PE   breath sounds clear to auscultation       Cardiovascular hypertension, + angina + CAD and + Past MI  (-) CHF (-) dysrhythmias  Rhythm:Regular     Neuro/Psych negative neurological ROS  negative psych ROS   GI/Hepatic negative GI ROS, Neg liver ROS,   Endo/Other  negative endocrine ROS  Renal/GU negative Renal ROS     Musculoskeletal  (+) Arthritis ,   Abdominal   Peds  Hematology negative hematology ROS (+)   Anesthesia Other Findings   Reproductive/Obstetrics                           Anesthesia Physical Anesthesia Plan  ASA: IV  Anesthesia Plan: General   Post-op Pain Management:    Induction: Intravenous  Airway Management Planned: Oral ETT  Additional Equipment: Arterial line, CVP, PA Cath, 3D TEE and Ultrasound Guidance Line Placement  Intra-op Plan:   Post-operative Plan: Post-operative intubation/ventilation  Informed Consent: I have reviewed the patients History and Physical, chart, labs and discussed the procedure including the risks, benefits and alternatives for the proposed anesthesia with the patient or authorized representative who has indicated his/her understanding and acceptance.   Dental advisory given  Plan Discussed with: CRNA, Anesthesiologist and Surgeon  Anesthesia Plan Comments:         Anesthesia Quick Evaluation

## 2015-07-25 NOTE — Plan of Care (Signed)
Problem: Phase II Progression Outcomes Goal: Pain controlled with appropriate interventions Outcome: Completed/Met Date Met:  07/25/15 Denies pain

## 2015-07-25 NOTE — Procedures (Signed)
Extubation Procedure Note  Patient Details:   Name: Jeffrey Spence DOB: February 06, 1961 MRN: [id number]   Airway Documentation:     Evaluation  O2 sats: stable throughout and currently acceptable Complications: No apparent complications Patient did tolerate procedure well. Bilateral Breath Sounds: Clear, Diminished   Yes  Miquel Dunn 07/25/2015, 5:37 PM

## 2015-07-25 NOTE — Brief Op Note (Addendum)
07/23/2015 - 07/25/2015      [address]       [place] [address]             [phone]     07/23/2015 - 07/25/2015  12:29 PM  PATIENT:  Jeffrey Spence  55 y.o. male  PRE-OPERATIVE DIAGNOSIS:  LEFT MAIN and 3 VESSEL CAD   POST-OPERATIVE DIAGNOSIS:  LEFT MAIN and 3 VESSEL CAD   PROCEDURE:  Procedure(s): CORONARY ARTERY BYPASS GRAFTING X 3  BILATERAL IMA AND LEFT RADIAL ARTERY  LIMA-LAD  RIMA-RCA  L RADIAL -OM1  TRANSESOPHAGEAL ECHOCARDIOGRAM (TEE)  SURGEON:  Surgeon(s): Melrose Nakayama, MD  PHYSICIAN ASSISTANT: WAYNE FOLD PA-C; Lawernce Keas PA-S  ANESTHESIA:   general  PATIENT CONDITION:  ICU - intubated and hemodynamically stable.  PRE-OPERATIVE WEIGHT: [id number]  COMPLICATIONS: NO KNOWN  Good quality conduits and targets XC= 58 min CPB= 98 min

## 2015-07-25 NOTE — H&P (View-Only) (Signed)
Reason for Consult:Left main/ 3 vessel CAD Referring Physician: Dr. Gollan  Jeffrey Spence is an 55 y.o. male.  HPI: Jeffrey Spence is a 55 yo man with a history of tobacco abuse and a strong family history of CAD who presents with a cc/o CP.  Jeffrey Spence has no known cardiac history. Jeffrey Spence first noted CP on 1/26 wile at work. Jeffrey Spence was carrying about 5 pounds when Jeffrey Spence felt a constriction in his chest. It improved with rest but did not totally resolve for about an hour. Jeffrey Spence went to the doctor the following day but his ECG and labs were remarkable only for an LDL of 151. Jeffrey Spence had no issues the next couple of days but Jeffrey Spence developed recurrent chest tightness on 1/30 and went to the ED. His initial troponin was 0.07 and rose to 1.49. A CT chest showed a small gallbladder polyp but was otherwise unremarkable. Jeffrey Spence has not had any further CP since admission.  Yesterday Jeffrey Spence underwent cardiac catheterization which revealed severe left main and 3 vessel CAD. His EF was normal.   Past Medical History  Diagnosis Date  . Arthritis   . CAD (coronary artery disease)     a. nstemi 1.2017; b. cardiac cath 06/2015 with LM and severe 3 veseel dz (full report pending)  . HLD (hyperlipidemia)   . Tobacco abuse   . NSTEMI (non-ST elevated myocardial infarction) (HCC) 06/2015    Past Surgical History  Procedure Laterality Date  . Fractured toe Right 2013    great toe/with pin  . Cardiac catheterization Bilateral 07/23/2015    Procedure: Left Heart Cath and Coronary Angiography;  Surgeon: Timothy J Gollan, MD;  Location: [facility] INVASIVE CV [facility];  Service: Cardiovascular;  Laterality: Bilateral;    Family History  Problem Relation Age of Onset  . Arthritis Mother     ra  . Heart attack Father 55  . Cancer Father   . Cancer Paternal Uncle     leukemia  . Colon cancer Neg Hx     Social History:  reports that Jeffrey Spence has been smoking Cigarettes.  Jeffrey Spence has been smoking about 1.00 pack per day. Jeffrey Spence has never used smokeless tobacco. Jeffrey Spence reports  that Jeffrey Spence drinks alcohol. Jeffrey Spence reports that Jeffrey Spence does not use illicit drugs.  Allergies: No Known Allergies  Medications:  Scheduled: . aspirin EC  81 mg Oral Daily  . atorvastatin  40 mg Oral q1800  . carvedilol  3.125 mg Oral BID WC  . docusate sodium  100 mg Oral BID  . nicotine  21 mg Transdermal Daily  . nitroGLYCERIN  0.5 inch Topical 4 times per day    Results for orders placed or performed during the hospital encounter of 07/23/15 (from the past 48 hour(s))  MRSA PCR Screening     Status: None   Collection Time: 07/23/15  1:42 PM  Result Value Ref Range   MRSA by PCR NEGATIVE NEGATIVE    Comment:        The GeneXpert MRSA Assay (FDA approved for NASAL specimens only), is one component of a comprehensive MRSA colonization surveillance program. It is not intended to diagnose MRSA infection nor to guide or monitor treatment for MRSA infections.   Basic metabolic panel     Status: Abnormal   Collection Time: 07/23/15  5:44 PM  Result Value Ref Range   Sodium 141 135 - 145 mmol/L   Potassium 3.9 3.5 - 5.1 mmol/L   Chloride 106 101 - 111 mmol/L     CO2 23 22 - 32 mmol/L   Glucose, Bld 88 65 - 99 mg/dL   BUN 10 6 - 20 mg/dL   Creatinine, Ser 0.88 0.61 - 1.24 mg/dL   Calcium 8.8 (L) 8.9 - 10.3 mg/dL   GFR calc non Af Amer >60 >60 mL/min   GFR calc Af Amer >60 >60 mL/min    Comment: (NOTE) The eGFR has been calculated using the CKD EPI equation. This calculation has not been validated in all clinical situations. eGFR's persistently <60 mL/min signify possible Chronic Kidney Disease.    Anion gap 12 5 - 15  CBC WITH DIFFERENTIAL     Status: None   Collection Time: 07/23/15  5:44 PM  Result Value Ref Range   WBC 7.4 4.0 - 10.5 K/uL   RBC 4.45 4.22 - 5.81 MIL/uL   Hemoglobin 13.9 13.0 - 17.0 g/dL   HCT 41.3 39.0 - 52.0 %   MCV 92.8 78.0 - 100.0 fL   MCH 31.2 26.0 - 34.0 pg   MCHC 33.7 30.0 - 36.0 g/dL   RDW 13.5 11.5 - 15.5 %   Platelets 269 150 - 400 K/uL    Neutrophils Relative % 58 %   Neutro Abs 4.3 1.7 - 7.7 K/uL   Lymphocytes Relative 30 %   Lymphs Abs 2.2 0.7 - 4.0 K/uL   Monocytes Relative 10 %   Monocytes Absolute 0.7 0.1 - 1.0 K/uL   Eosinophils Relative 2 %   Eosinophils Absolute 0.1 0.0 - 0.7 K/uL   Basophils Relative 0 %   Basophils Absolute 0.0 0.0 - 0.1 K/uL  Brain natriuretic peptide     Status: None   Collection Time: 07/23/15  5:46 PM  Result Value Ref Range   B Natriuretic Peptide 76.0 0.0 - 100.0 pg/mL  CBC     Status: None   Collection Time: 07/24/15  4:00 AM  Result Value Ref Range   WBC 8.7 4.0 - 10.5 K/uL   RBC 4.38 4.22 - 5.81 MIL/uL   Hemoglobin 13.6 13.0 - 17.0 g/dL   HCT 40.5 39.0 - 52.0 %   MCV 92.5 78.0 - 100.0 fL   MCH 31.1 26.0 - 34.0 pg   MCHC 33.6 30.0 - 36.0 g/dL   RDW 13.3 11.5 - 15.5 %   Platelets 255 150 - 400 K/uL  Basic metabolic panel     Status: Abnormal   Collection Time: 07/24/15  4:00 AM  Result Value Ref Range   Sodium 139 135 - 145 mmol/L   Potassium 4.2 3.5 - 5.1 mmol/L   Chloride 106 101 - 111 mmol/L   CO2 22 22 - 32 mmol/L   Glucose, Bld 99 65 - 99 mg/dL   BUN 12 6 - 20 mg/dL   Creatinine, Ser 0.91 0.61 - 1.24 mg/dL   Calcium 8.6 (L) 8.9 - 10.3 mg/dL   GFR calc non Af Amer >60 >60 mL/min   GFR calc Af Amer >60 >60 mL/min    Comment: (NOTE) The eGFR has been calculated using the CKD EPI equation. This calculation has not been validated in all clinical situations. eGFR's persistently <60 mL/min signify possible Chronic Kidney Disease.    Anion gap 11 5 - 15  Heparin level (unfractionated)     Status: Abnormal   Collection Time: 07/24/15  4:00 AM  Result Value Ref Range   Heparin Unfractionated 0.15 (L) 0.30 - 0.70 IU/mL    Comment:        IF HEPARIN RESULTS  ARE BELOW EXPECTED VALUES, AND PATIENT DOSAGE HAS BEEN CONFIRMED, SUGGEST FOLLOW UP TESTING OF ANTITHROMBIN III LEVELS.   Heparin level (unfractionated)     Status: None   Collection Time: 07/24/15 11:09 AM   Result Value Ref Range   Heparin Unfractionated 0.31 0.30 - 0.70 IU/mL    Comment:        IF HEPARIN RESULTS ARE BELOW EXPECTED VALUES, AND PATIENT DOSAGE HAS BEEN CONFIRMED, SUGGEST FOLLOW UP TESTING OF ANTITHROMBIN III LEVELS.     Dg Chest 2 View  07/22/2015  CLINICAL DATA:  Tightness in chest EXAM: CHEST  2 VIEW COMPARISON:  07/22/2015 FINDINGS: Heart size is normal. No pleural effusion or edema identified. No airspace consolidation. The visualized bony structures are normal. IMPRESSION: 1. No acute cardiopulmonary abnormalities. Electronically Signed   By: Taylor  Stroud M.D.   On: 07/22/2015 22:10   Ct Abdomen Pelvis W Contrast  07/23/2015  CLINICAL DATA:  Abdominal pain and bloating EXAM: CT ABDOMEN AND PELVIS WITH CONTRAST TECHNIQUE: Multidetector CT imaging of the abdomen and pelvis was performed using the standard protocol following bolus administration of intravenous contrast. CONTRAST:  100mL OMNIPAQUE IOHEXOL 300 MG/ML  SOLN COMPARISON:  None. FINDINGS: Lower chest and abdominal wall: Fatty umbilical hernia, small. Fatty bilateral inguinal hernia, likely both direct and indirect. Extensive coronary atherosclerotic calcification for age Mild dependent atelectasis Hepatobiliary: No focal liver abnormality.Layering calcified cholelithiasis. There is also an anti dependent nodule which does not appear calcified and is likely a 6mm polyp. No evidence of acute cholecystitis. Pancreas: Unremarkable. Spleen: Unremarkable. Adrenals/Urinary Tract: Negative adrenals. No hydronephrosis or stone. Tiny left renal cyst. Unremarkable bladder. Reproductive:No pathologic findings. Stomach/Bowel:  No obstruction. No appendicitis. Vascular/Lymphatic: No acute vascular abnormality. No mass or adenopathy. Peritoneal: No ascites or pneumoperitoneum. Musculoskeletal: No acute abnormalities. IMPRESSION: 1. No acute finding. 2. Cholelithiasis and probable 6 mm gallbladder polyp. 3. Extensive coronary  atherosclerosis. 4. Fatty inguinal and umbilical hernias. Electronically Signed   By: Jonathon  Watts M.D.   On: 07/23/2015 04:35    Review of Systems  Constitutional: Positive for diaphoresis. Negative for fever and chills.  Eyes: Negative for blurred vision and double vision.  Respiratory: Positive for shortness of breath. Negative for cough, sputum production and wheezing.   Cardiovascular: Positive for chest pain and palpitations. Negative for orthopnea, claudication, leg swelling and PND.  Gastrointestinal: Negative for nausea and vomiting.  Genitourinary: Negative for dysuria and hematuria.  Musculoskeletal: Negative for myalgias and joint pain.  Neurological: Negative for focal weakness, seizures, weakness and headaches.  Endo/Heme/Allergies: Does not bruise/bleed easily.  All other systems reviewed and are negative.  Blood pressure 129/105, pulse 87, temperature 98.7 F (37.1 C), temperature source Oral, resp. rate 18, height 5' 8" (1.727 m), weight 167 lb 15.9 oz (76.2 kg), SpO2 99 %. Physical Exam  Vitals reviewed. Constitutional: Jeffrey Spence is oriented to person, place, and time. Jeffrey Spence appears well-developed and well-nourished.  HENT:  Head: Normocephalic and atraumatic.  Mouth/Throat: No oropharyngeal exudate.  Eyes: Conjunctivae and EOM are normal. Pupils are equal, round, and reactive to light. No scleral icterus.  Neck: Neck supple. No tracheal deviation present. No thyromegaly present.  No carotid bruits  Cardiovascular: Normal rate, regular rhythm, normal heart sounds and intact distal pulses.  Exam reveals no gallop and no friction rub.   No murmur heard. Respiratory: Effort normal and breath sounds normal. No respiratory distress. Jeffrey Spence has no wheezes. Jeffrey Spence has no rales.  GI: Soft. Jeffrey Spence exhibits no distension. There is no tenderness.    Musculoskeletal: Normal range of motion. Jeffrey Spence exhibits no edema.  Lymphadenopathy:    Jeffrey Spence has no cervical adenopathy.  Neurological: Jeffrey Spence is alert and  oriented to person, place, and time. No cranial nerve deficit. Jeffrey Spence exhibits normal muscle tone.  Skin: Skin is warm and dry.  Psychiatric:  Flat affect    Assessment/Plan: 54 yo man with a history of tobacco abuse, dyslipidemia and a strong family history of premature CAD presents with a NSTEMI. Jeffrey Spence has been found to have severe left main and 3 vessel CAD. CABG is indicated for survival benefit and relief of symptoms.  Jeffrey Spence is a good candidate for all arterial revascularization given age, not diabetic and normal Allen's test on left.  I discussed the proposed procedure with Jeffrey Spence including the general nature of the procedure, the need for general anesthesia, the use of cardiopulmonary bypass, and the incisions to be used. Jeffrey Spence discussed the use of multiple arterial grafts including use of the left radial. I  discussed the expected hospital stay, overall recovery and short and long term outcomes. I reviewed the indications, risks, benefits and alternatives. Jeffrey Spence understands the risks include, but are not limited to death, stroke, MI, DVT/PE, bleeding, possible need for transfusion, infections, cardiac arrhythmias, and other organ system dysfunction including respiratory, renal, or GI complications. Jeffrey Spence accepts the risks and agrees to proceed.  Plan CABG with BIMA + left radial tomorrow AM   Steven C Hendrickson 07/24/2015, 1:33 PM      

## 2015-07-25 NOTE — Anesthesia Procedure Notes (Addendum)
Procedure Name: Intubation Date/Time: 07/25/2015 8:19 AM Performed by: Garrison Columbus T Pre-anesthesia Checklist: Patient identified, Emergency Drugs available, Suction available and Patient being monitored Patient Re-evaluated:Patient Re-evaluated prior to inductionOxygen Delivery Method: Circle system utilized Preoxygenation: Pre-oxygenation with 100% oxygen Intubation Type: IV induction Ventilation: Mask ventilation without difficulty and Oral airway inserted - appropriate to patient size Laryngoscope Size: Mac, 3, Glidescope and 4 Grade View: Grade IV Tube type: Oral Tube size: 8.5 mm Number of attempts: 2 Airway Equipment and Method: Stylet,  Oral airway and Video-laryngoscopy Placement Confirmation: ETT inserted through vocal cords under direct vision,  positive ETCO2 and breath sounds checked- equal and bilateral Secured at: 23 cm Tube secured with: Tape Dental Injury: Teeth and Oropharynx as per pre-operative assessment  Difficulty Due To: Difficulty was unanticipated and Difficult Airway- due to anterior larynx Comments: Intubation by Macie Burows.    Central Venous Catheter Insertion Performed by: anesthesiologist Patient location: Pre-op. Preanesthetic checklist: patient identified, IV checked, site marked, risks and benefits discussed, surgical consent, monitors and equipment checked, pre-op evaluation, timeout performed and anesthesia consent Position: Trendelenburg Lidocaine 1% used for infiltration Landmarks identified and Seldinger technique used Catheter size: 9 Fr Central line was placed.MAC introducer Procedure performed using ultrasound guided technique. Attempts: 1 Following insertion, line sutured and dressing applied. Post procedure assessment: blood return through all ports, free fluid flow and no air. Patient tolerated the procedure well with no immediate complications.    Central Venous Catheter Insertion Performed by: anesthesiologist Patient location:  Pre-op. Preanesthetic checklist: patient identified, IV checked, site marked, risks and benefits discussed, surgical consent, monitors and equipment checked, pre-op evaluation, timeout performed and anesthesia consent Position: supine Landmarks identified PA cath was placed.Swan type and PA catheter depth:thermodilation and 60PA Cath depth:60 Procedure performed without using ultrasound guided technique. Attempts: 1 Patient tolerated the procedure well with no immediate complications.

## 2015-07-26 ENCOUNTER — Encounter (HOSPITAL_COMMUNITY): Payer: Self-pay | Admitting: Thoracic Surgery (Cardiothoracic Vascular Surgery)

## 2015-07-26 ENCOUNTER — Inpatient Hospital Stay (HOSPITAL_COMMUNITY): Payer: 59

## 2015-07-26 LAB — CBC
HCT: 30.3 % — ABNORMAL LOW (ref 39.0–52.0)
HCT: 32.1 % — ABNORMAL LOW (ref 39.0–52.0)
Hemoglobin: 10.2 g/dL — ABNORMAL LOW (ref 13.0–17.0)
Hemoglobin: 10.7 g/dL — ABNORMAL LOW (ref 13.0–17.0)
MCH: 30.8 pg (ref 26.0–34.0)
MCH: 30.7 pg (ref 26.0–34.0)
MCHC: 33.7 g/dL (ref 30.0–36.0)
MCHC: 33.3 g/dL (ref 30.0–36.0)
MCV: 91.5 fL (ref 78.0–100.0)
MCV: 92.2 fL (ref 78.0–100.0)
Platelets: 169 10*3/uL (ref 150–400)
Platelets: 183 10*3/uL (ref 150–400)
RBC: 3.31 MIL/uL — ABNORMAL LOW (ref 4.22–5.81)
RBC: 3.48 MIL/uL — ABNORMAL LOW (ref 4.22–5.81)
RDW: 13.3 % (ref 11.5–15.5)
RDW: 13.5 % (ref 11.5–15.5)
WBC: 14.4 10*3/uL — ABNORMAL HIGH (ref 4.0–10.5)
WBC: 15.2 10*3/uL — ABNORMAL HIGH (ref 4.0–10.5)

## 2015-07-26 LAB — POCT I-STAT, CHEM 8
BUN: 9 mg/dL (ref 6–20)
Calcium, Ion: 1.2 mmol/L (ref 1.12–1.23)
Chloride: 103 mmol/L (ref 101–111)
Creatinine, Ser: 1 mg/dL (ref 0.61–1.24)
Glucose, Bld: 154 mg/dL — ABNORMAL HIGH (ref 65–99)
HCT: 32 % — ABNORMAL LOW (ref 39.0–52.0)
Hemoglobin: 10.9 g/dL — ABNORMAL LOW (ref 13.0–17.0)
Potassium: 4.1 mmol/L (ref 3.5–5.1)
Sodium: 136 mmol/L (ref 135–145)
TCO2: 22 mmol/L (ref 0–100)

## 2015-07-26 LAB — GLUCOSE, CAPILLARY
Glucose-Capillary: 103 mg/dL — ABNORMAL HIGH (ref 65–99)
Glucose-Capillary: 128 mg/dL — ABNORMAL HIGH (ref 65–99)
Glucose-Capillary: 140 mg/dL — ABNORMAL HIGH (ref 65–99)
Glucose-Capillary: 142 mg/dL — ABNORMAL HIGH (ref 65–99)
Glucose-Capillary: 143 mg/dL — ABNORMAL HIGH (ref 65–99)
Glucose-Capillary: 147 mg/dL — ABNORMAL HIGH (ref 65–99)
Glucose-Capillary: 147 mg/dL — ABNORMAL HIGH (ref 65–99)

## 2015-07-26 LAB — BASIC METABOLIC PANEL
Anion gap: 9 (ref 5–15)
BUN: 9 mg/dL (ref 6–20)
CO2: 22 mmol/L (ref 22–32)
Calcium: 8.2 mg/dL — ABNORMAL LOW (ref 8.9–10.3)
Chloride: 106 mmol/L (ref 101–111)
Creatinine, Ser: 1.02 mg/dL (ref 0.61–1.24)
GFR calc Af Amer: >60 mL/min (ref >60)
GFR calc non Af Amer: >60 mL/min (ref >60)
Glucose, Bld: 110 mg/dL — ABNORMAL HIGH (ref 65–99)
Potassium: 4.1 mmol/L (ref 3.5–5.1)
Sodium: 137 mmol/L (ref 135–145)

## 2015-07-26 LAB — CREATININE, SERUM
Creatinine, Ser: 1.05 mg/dL (ref 0.61–1.24)
GFR calc Af Amer: >60 mL/min (ref >60)
GFR calc non Af Amer: >60 mL/min (ref >60)

## 2015-07-26 LAB — MAGNESIUM
Magnesium: 2.5 mg/dL — ABNORMAL HIGH (ref 1.7–2.4)
Magnesium: 2.3 mg/dL (ref 1.7–2.4)

## 2015-07-26 MED ORDER — ENOXAPARIN SODIUM 40 MG/0.4ML ~~LOC~~ SOLN
40.0000 mg | Freq: Every day | SUBCUTANEOUS | Status: DC
  Administered 2015-07-26 – 2015-07-30 (×5): 40 mg via SUBCUTANEOUS
  Filled 2015-07-26 (×5): qty 0.4

## 2015-07-26 MED ORDER — KETOROLAC TROMETHAMINE 30 MG/ML IJ SOLN
30.0000 mg | Freq: Four times a day (QID) | INTRAMUSCULAR | Status: DC | PRN
Start: 2015-07-26 — End: 2015-07-28

## 2015-07-26 MED ORDER — ISOSORBIDE MONONITRATE ER 30 MG PO TB24
15.0000 mg | ORAL_TABLET | Freq: Every day | ORAL | Status: DC
  Administered 2015-07-26 – 2015-07-29 (×4): 15 mg via ORAL
  Filled 2015-07-26 (×4): qty 1

## 2015-07-26 MED ORDER — INSULIN ASPART 100 UNIT/ML ~~LOC~~ SOLN
0.0000 [IU] | SUBCUTANEOUS | Status: DC
  Administered 2015-07-26 – 2015-07-27 (×5): 2 [IU] via SUBCUTANEOUS

## 2015-07-26 MED ORDER — FUROSEMIDE 10 MG/ML IJ SOLN
20.0000 mg | Freq: Once | INTRAMUSCULAR | Status: AC
  Administered 2015-07-26: 20 mg via INTRAVENOUS
  Filled 2015-07-26: qty 2

## 2015-07-26 NOTE — Progress Notes (Signed)
1 Day Post-Op Procedure(s) (LRB): CORONARY ARTERY BYPASS GRAFTING X 3 UTILIZING BILATERAL IMA AND LEFT RADIAL ARTERY (N/A) RADIAL ARTERY HARVEST (Left) TRANSESOPHAGEAL ECHOCARDIOGRAM (TEE) (N/A) Subjective: C/o incisional pain  Objective: Vital signs in last 24 hours: Temp:  [96.8 F (36 C)-100.8 F (38.2 C)] 99.5 F (37.5 C) (02/03 0700) Pulse Rate:  [52-102] 101 (02/03 0700) Cardiac Rhythm:  [-] Normal sinus rhythm (02/02 1900) Resp:  [12-28] 19 (02/03 0700) BP: (80-106)/(54-72) 106/72 mmHg (02/03 0700) SpO2:  [87 %-100 %] 96 % (02/03 0700) Arterial Line BP: (88-140)/(50-69) 128/56 mmHg (02/03 0700) FiO2 (%):  [40 %-50 %] 40 % (02/02 1640) Weight:  [171 lb 8.3 oz (77.8 kg)-172 lb 9.9 oz (78.3 kg)] 172 lb 9.9 oz (78.3 kg) (02/03 0500)  Hemodynamic parameters for last 24 hours: PAP: (27-46)/(12-29) 32/16 mmHg CO:  [3.6 L/min-6.1 L/min] 6.1 L/min CI:  [1.9 L/min/m2-3.2 L/min/m2] 3.2 L/min/m2  Intake/Output from previous day: 02/02 0701 - 02/03 0700 In: 5681.9 [I.V.:3811.9; Blood:460; NG/GT:30; IV Piggyback:1380] Out: [id number] [Urine:2895; Emesis/NG output:280; Blood:900; Chest Tube:320] Intake/Output this shift:    General appearance: alert, cooperative and no distress Neurologic: intact Heart: regular rate and rhythm Lungs: diminished breath sounds bibasilar Abdomen: normal findings: soft, non-tender  Lab Results:  Recent Labs  07/25/15 2000 07/25/15 2019 07/26/15 0420  WBC 13.3*  --  14.4*  HGB 10.8* 10.5* 10.2*  HCT 31.9* 31.0* 30.3*  PLT 184  --  169   BMET:  Recent Labs  07/25/15 0451  07/25/15 2019 07/26/15 0420  NA 138  < > 139 137  K 4.0  < > 4.8 4.1  CL 102  < > 106 106  CO2 24  --   --  22  GLUCOSE 105*  < > 144* 110*  BUN 11  < > 9 9  CREATININE 1.00  < > 0.90 1.02  CALCIUM 9.3  --   --  8.2*  < > = values in this interval not displayed.  PT/INR:  Recent Labs  07/25/15 1400  LABPROT 16.8*  INR 1.35   ABG    Component Value Date/Time   PHART 7.358 07/25/2015 1847   HCO3 20.5 07/25/2015 1847   TCO2 22 07/25/2015 2019   ACIDBASEDEF 4.0* 07/25/2015 1847   O2SAT 97.0 07/25/2015 1847   CBG (last 3)   Recent Labs  07/25/15 2017 07/25/15 2335 07/26/15 0412  GLUCAP 131* 147* 103*    Assessment/Plan: S/P Procedure(s) (LRB): CORONARY ARTERY BYPASS GRAFTING X 3 UTILIZING BILATERAL IMA AND LEFT RADIAL ARTERY (N/A) RADIAL ARTERY HARVEST (Left) TRANSESOPHAGEAL ECHOCARDIOGRAM (TEE) (N/A) -  CV- good hemodynamics- dc swan  ASA, beta blocker, statin  imdur for radial graft  RESP- bibasilar atelectasis- IS  RENAL- creatinine and lytes OK- diurese  ENDO- CBG OK, transition to Q4 CBG/ SSI  Anemia secondary to ABL- mild, follow  SCD + enoxaparin for DVT prophylaxis   LOS: 3 days    Melrose Nakayama 07/26/2015

## 2015-07-26 NOTE — Op Note (Signed)
NAMENEHEMIAS, KNAFF                ACCOUNT NO.:  [id number]  MEDICAL RECORD NO.:  [id number]  LOCATION:  2S01C                        FACILITY:  [facility]  PHYSICIAN:  Revonda Standard. Roxan Hockey, M.D.DATE OF BIRTH:  10/29/1960  DATE OF PROCEDURE:  07/25/2015 DATE OF DISCHARGE:                              OPERATIVE REPORT   PREOPERATIVE DIAGNOSIS:  Left main and 3-vessel coronary disease status post non-ST elevation myocardial infarction.  POSTOPERATIVE DIAGNOSIS:  Left main and 3-vessel coronary disease status post non-ST elevation myocardial infarction.  PROCEDURE:  Median sternotomy, extracorporeal circulation Coronary artery bypass grafting x 3  Left internal mammary artery to LAD  Right internal mammary artery to distal right coronary  Left radial artery to OM 1.  SURGEON:  Revonda Standard. Roxan Hockey, M.D.  ASSISTANT:  John Giovanni, P.A.-C.  ANESTHESIA:  General.  FINDINGS:  Good quality conduits, good quality targets.  Preserved left ventricular function with no significant valvular pathology by transesophageal echocardiography.  CLINICAL NOTE:  Mr. Tidrick is a 55 year old gentleman with a history of tobacco abuse and a strong family history of coronary disease.  He also has recently diagnosed dyslipidemia.  He presented with crescendo chest pain and ruled in for a non-ST elevation MI.  He underwent cardiac catheterization where he was found to have severe left main and 3-vessel coronary disease with preserved left ventricular function.  He was referred for coronary artery bypass grafting for survival benefit and relief of symptoms.  The indications, risks, benefits, and alternatives were discussed in detail with the patient.  He understood and accepted the risks and agreed to proceed.  OPERATIVE NOTE:  Mr. Wannamaker was brought to the preoperative holding area on July 25, 2015.  Anesthesia placed a Swan-Ganz catheter and an arterial blood pressure monitoring line.  He was  taken to the operating room, anesthetized, and intubated.  Intravenous antibiotics were administered.  A Foley catheter was placed.  Transesophageal echocardiography was performed.  It revealed preserved left ventricular function with no significant valvular pathology.  The chest, abdomen, legs, and left arm were prepped and draped in usual sterile fashion. The Allen's test had been checked again in the preoperative holding area and it was normal.  A median sternotomy was performed, and the left internal mammary artery was harvested using standard technique.  Simultaneously, an incision was made over the volar aspect of the left wrist distally.  A short segment of the radial artery was mobilized.  There was a good distal pulse with proximal occlusion and there was a good Doppler signal in the palmar arch.  The incision was extended up to just below the antecubital fossa, and the radial artery was harvested using the Harmonic Scalpel.  It was a good quality vessel as was the left mammary artery.  2000 units of heparin was administered during the vessel harvest.  After harvesting the left mammary, the chest wound was packed with saline-soaked gauze until the left radial incision was closed, left arm was wrapped and tucked to the patient's side.  The right internal mammary artery then was harvested in standard fashion.  It was also a good quality vessel. The remainder of the full heparin dose was given  prior to dividing the distal end of the right mammary artery.  A sternal retractor was placed.  The pericardium was opened.  The ascending aorta was of normal caliber with no palpable atherosclerotic disease.  After confirming adequate anticoagulation with ACT measurement, the aorta was cannulated via concentric 2-0 Ethibond pledgeted pursestring sutures.  A dual-stage venous cannula was placed via pursestring suture in the right atrial appendage.  Cardiopulmonary bypass was initiated.   Flows were maintained per protocol.  The patient was cooled to 32 degrees Celsius.  The coronary arteries were inspected and anastomotic sites were chosen.  The conduits were inspected and prepared for grafting.  A foam pad was placed in the pericardium to insulate the heart.  A temperature probe was placed in the myocardial septum and a cardioplegia cannula was placed in the ascending aorta.  The aorta was crossclamped.  The left ventricle was emptied via the aortic root vent.  Cardiac arrest then was achieved with combination of cold antegrade blood cardioplegia and topical iced saline.  1 L of cardioplegia was administered.  There was a rapid diastolic arrest and septal cooling to 12 degrees Celsius.  The heart was elevated exposing the obtuse marginal branches. There were multiple branches, all of similar size, compromised by a tight stenosis in the left circumflex with all of the branches interconnected.  The left radial was anastomosed end-to-side to OM 1 with a running 8-0 Prolene suture.  A probe passed easily proximally and distally at the completion of the anastomosis.  Cardioplegia was administered via the aortic root, and there was good backbleeding from the radial artery.  An incision was made on the right side of the pericardium well anterior to the phrenic nerve to allow passage of the right mammary artery.  The distal right coronary artery was exposed just over the acute margin of the heart.  An arteriotomy was made.  It was a 2 mm good quality vessel with no atherosclerotic disease.  The distal end of the right mammary artery was beveled and was anastomosed end-to-side to the distal right coronary with a running 8-0 Prolene suture.  Again, a probe passed easily proximally and distally.  The bulldog clamp was removed to inspect for hemostasis and then was replaced.  The mammary pedicle was tacked to the epicardial surface of the heart with 6-0 Prolene  sutures. Additional cardioplegia was administered down the aortic root.  The left internal mammary artery then was brought through a window in the pericardium.  The distal end was beveled.  It was anastomosed end-to- side to the distal LAD.  The LAD was a 2 mm good quality target at the site of the anastomosis, the mammary was a good quality conduit.  The end-to-side anastomosis was performed with a running 8-0 Prolene suture. At the completion of the anastomosis, the bulldog clamp was removed. Rapid septal rewarming was noted.  The bulldog clamp was replaced.  The mammary pedicle was tacked to the epicardial surface of the heart with 6- 0 Prolene sutures.  The cardioplegia cannula was removed from the ascending aorta.  The radial artery was cut to length proximally and beveled. It was anastomosed end-to-side to a 4.0 mm punch aortotomy with a running 7-0 Prolene suture.  At the completion of the proximal anastomosis, the patient was placed in Trendelenburg position.  The bulldog clamps were removed from all the grafts.  De-airing was performed.  Lidocaine was administered. The aortic crossclamp was removed.  Total crossclamp time was 58 minutes.  The patient initially fibrillated, and required a single defibrillation with 10 joules and then was in sinus rhythm thereafter. While rewarming was completed, all proximal and distal anastomoses were inspected for hemostasis.  Epicardial pacing wires were placed on the right ventricle and right atrium.  Atrial pacing at 90 beats per minute was initiated.  When the patient had rewarmed to a core temperature of 37 degrees Celsius, he was weaned from cardiopulmonary bypass on the first attempt without difficulty.  The total bypass time was 98 minutes. The initial cardiac index was greater than 2 L/min/squared m. Transesophageal echocardiography showed preserved left ventricular function unchanged from the prebypass study.  The patient  remained hemodynamically stable throughout the postbypass period.  A test dose of protamine was administered and was well tolerated.  The atrial and aortic cannulae were removed.  The remainder of protamine was administered without incident.  The chest irrigated with warm saline. Hemostasis was achieved.  The right side of the pleura was tacked to the left edge of the pericardium to cover the ascending aorta and prevent adhesions of the right mammary to the sternotomy.  A single mediastinal tube and bilateral pleural tubes were placed through separate subcostal incisions.  The sternum was closed with a combination of single and double heavy gauge stainless steel wires.  The pectoralis fascia, subcutaneous tissue, and skin were closed in standard fashion.  All sponge, needle, and instrument counts were correct at the end of the procedure.  The patient was taken from the operating room to the surgical intensive care unit in good condition.     Revonda Standard Roxan Hockey, M.D.     SCH/MEDQ  D:  07/25/2015  T:  07/25/2015  Job:  [id number]

## 2015-07-26 NOTE — Progress Notes (Signed)
EVENING ROUNDS NOTE :     [address] [place]       [place] [address]             [phone]                 1 Day Post-Op Procedure(s) (LRB): CORONARY ARTERY BYPASS GRAFTING X 3 UTILIZING BILATERAL IMA AND LEFT RADIAL ARTERY (N/A) RADIAL ARTERY HARVEST (Left) TRANSESOPHAGEAL ECHOCARDIOGRAM (TEE) (N/A)  Total Length of Stay:  LOS: 3 days  BP 104/64 mmHg  Pulse 99  Temp(Src) 97.9 F (36.6 C) (Oral)  Resp 15  Ht 5\' 8"  (1.727 m)  Wt 172 lb 9.9 oz (78.3 kg)  BMI 26.25 kg/m2  SpO2 94%  .Intake/Output      02/03 0701 - 02/04 0700   P.O. 460   I.V. (mL/kg) 451.6 (5.8)   Blood    NG/GT    IV Piggyback 100   Total Intake(mL/kg) 1011.6 (12.9)   Urine (mL/kg/hr) 1415 (1.2)   Emesis/NG output    Blood    Chest Tube 80 (0.1)   Total Output 1495   Net -483.4         . sodium chloride 20 mL/hr at 07/26/15 0600  . sodium chloride 250 mL (07/26/15 0503)  . sodium chloride Stopped (07/25/15 1900)  . dexmedetomidine Stopped (07/25/15 1735)  . lactated ringers Stopped (07/26/15 0700)  . lactated ringers 20 mL/hr at 07/26/15 0600  . phenylephrine (NEO-SYNEPHRINE) Adult infusion Stopped (07/26/15 0900)     Lab Results  Component Value Date   WBC 15.2* 07/26/2015   HGB 10.9* 07/26/2015   HCT 32.0* 07/26/2015   PLT 183 07/26/2015   GLUCOSE 154* 07/26/2015   CHOL 223* 07/19/2015   TRIG 127.0 07/19/2015   HDL 42.90 07/19/2015   LDLDIRECT 140.9 11/15/2012   LDLCALC 155* 07/19/2015   ALT 23 07/22/2015   AST 17 07/22/2015   NA 136 07/26/2015   K 4.1 07/26/2015   CL 103 07/26/2015   CREATININE 1.00 07/26/2015   BUN 9 07/26/2015   CO2 22 07/26/2015   TSH 1.910 07/23/2015   PSA 0.89 11/15/2012   INR 1.35 07/25/2015   HGBA1C 5.5 07/23/2015   Stable day, tubes out Walked half unit today Sinus rhythm  Grace Isaac MD  Beeper [phone] Office [phone] 07/26/2015 9:52 PM

## 2015-07-27 ENCOUNTER — Inpatient Hospital Stay (HOSPITAL_COMMUNITY): Payer: 59

## 2015-07-27 DIAGNOSIS — I25110 Atherosclerotic heart disease of native coronary artery with unstable angina pectoris: Secondary | ICD-10-CM | POA: Insufficient documentation

## 2015-07-27 LAB — CBC
HCT: 28.6 % — ABNORMAL LOW (ref 39.0–52.0)
Hemoglobin: 9.5 g/dL — ABNORMAL LOW (ref 13.0–17.0)
MCH: 30.9 pg (ref 26.0–34.0)
MCHC: 33.2 g/dL (ref 30.0–36.0)
MCV: 93.2 fL (ref 78.0–100.0)
Platelets: 159 10*3/uL (ref 150–400)
RBC: 3.07 MIL/uL — ABNORMAL LOW (ref 4.22–5.81)
RDW: 13.6 % (ref 11.5–15.5)
WBC: 13.2 10*3/uL — ABNORMAL HIGH (ref 4.0–10.5)

## 2015-07-27 LAB — DIGOXIN LEVEL: Digoxin Level: <0.2 ng/mL — ABNORMAL LOW (ref 0.8–2.0)

## 2015-07-27 LAB — BASIC METABOLIC PANEL
Anion gap: 7 (ref 5–15)
BUN: 10 mg/dL (ref 6–20)
CO2: 27 mmol/L (ref 22–32)
Calcium: 8.5 mg/dL — ABNORMAL LOW (ref 8.9–10.3)
Chloride: 102 mmol/L (ref 101–111)
Creatinine, Ser: 1 mg/dL (ref 0.61–1.24)
GFR calc Af Amer: >60 mL/min (ref >60)
GFR calc non Af Amer: >60 mL/min (ref >60)
Glucose, Bld: 126 mg/dL — ABNORMAL HIGH (ref 65–99)
Potassium: 4.4 mmol/L (ref 3.5–5.1)
Sodium: 136 mmol/L (ref 135–145)

## 2015-07-27 LAB — TROPONIN I
Troponin I: 1.02 ng/mL (ref <0.031)
Troponin I: 0.88 ng/mL (ref <0.031)
Troponin I: 0.71 ng/mL (ref <0.031)

## 2015-07-27 LAB — GLUCOSE, CAPILLARY
Glucose-Capillary: 106 mg/dL — ABNORMAL HIGH (ref 65–99)
Glucose-Capillary: 109 mg/dL — ABNORMAL HIGH (ref 65–99)
Glucose-Capillary: 116 mg/dL — ABNORMAL HIGH (ref 65–99)
Glucose-Capillary: 119 mg/dL — ABNORMAL HIGH (ref 65–99)
Glucose-Capillary: 124 mg/dL — ABNORMAL HIGH (ref 65–99)
Glucose-Capillary: 141 mg/dL — ABNORMAL HIGH (ref 65–99)

## 2015-07-27 LAB — HEPATIC FUNCTION PANEL
ALT: 15 U/L — ABNORMAL LOW (ref 17–63)
AST: 22 U/L (ref 15–41)
Albumin: 3 g/dL — ABNORMAL LOW (ref 3.5–5.0)
Alkaline Phosphatase: 104 U/L (ref 38–126)
Bilirubin, Direct: 0.3 mg/dL (ref 0.1–0.5)
Indirect Bilirubin: 0.8 mg/dL (ref 0.3–0.9)
Total Bilirubin: 1.1 mg/dL (ref 0.3–1.2)
Total Protein: 6.1 g/dL — ABNORMAL LOW (ref 6.5–8.1)

## 2015-07-27 LAB — TSH: TSH: 2.373 u[IU]/mL (ref 0.350–4.500)

## 2015-07-27 LAB — MAGNESIUM: Magnesium: 2 mg/dL (ref 1.7–2.4)

## 2015-07-27 MED ORDER — AMIODARONE HCL IN DEXTROSE 360-4.14 MG/200ML-% IV SOLN
60.0000 mg/h | INTRAVENOUS | Status: AC
  Administered 2015-07-27 (×2): 60 mg/h via INTRAVENOUS
  Filled 2015-07-27: qty 200

## 2015-07-27 MED ORDER — CETYLPYRIDINIUM CHLORIDE 0.05 % MT LIQD
7.0000 mL | Freq: Two times a day (BID) | OROMUCOSAL | Status: DC
  Administered 2015-07-27 – 2015-07-29 (×4): 7 mL via OROMUCOSAL

## 2015-07-27 MED ORDER — AMIODARONE HCL IN DEXTROSE 360-4.14 MG/200ML-% IV SOLN
INTRAVENOUS | Status: AC
  Filled 2015-07-27: qty 200

## 2015-07-27 MED ORDER — AMIODARONE LOAD VIA INFUSION
150.0000 mg | Freq: Once | INTRAVENOUS | Status: AC
  Administered 2015-07-27: 150 mg via INTRAVENOUS
  Filled 2015-07-27: qty 83.34

## 2015-07-27 MED ORDER — AMIODARONE HCL IN DEXTROSE 360-4.14 MG/200ML-% IV SOLN
30.0000 mg/h | INTRAVENOUS | Status: DC
  Administered 2015-07-27 – 2015-07-28 (×2): 30 mg/h via INTRAVENOUS
  Filled 2015-07-27 (×2): qty 200

## 2015-07-27 NOTE — Progress Notes (Signed)
Patient in and out of atrial fib. Dr. Servando Snare notified. Will initiate Amiodarone gtt and draw labs per order. Will continue to monitor.

## 2015-07-27 NOTE — Progress Notes (Signed)
Patient ID: SHAQVILLE GASQUE, male   DOB: 30-Sep-1960, 55 y.o.   MRN: [id number] [facility] DAILY ICU PROGRESS NOTE                   [place]            ,[place] [address]          [phone]   2 Days Post-Op Procedure(s) (LRB): CORONARY ARTERY BYPASS GRAFTING X 3 UTILIZING BILATERAL IMA AND LEFT RADIAL ARTERY (N/A) RADIAL ARTERY HARVEST (Left) TRANSESOPHAGEAL ECHOCARDIOGRAM (TEE) (N/A)  Total Length of Stay:  LOS: 4 days   Subjective: Up to chair, developed afib early this am,   Objective: Vital signs in last 24 hours: Temp:  [97.9 F (36.6 C)-99.5 F (37.5 C)] 98.4 F (36.9 C) (02/04 0718) Pulse Rate:  [92-112] 103 (02/04 0500) Cardiac Rhythm:  [-] Sinus tachycardia (02/04 0600) Resp:  [15-34] 22 (02/04 0500) BP: (94-151)/(55-94) 112/67 mmHg (02/04 0500) SpO2:  [92 %-96 %] 93 % (02/04 0500) Arterial Line BP: (120-207)/(54-79) 128/63 mmHg (02/03 1500) Weight:  [169 lb 5 oz (76.8 kg)] 169 lb 5 oz (76.8 kg) (02/04 0600)  Filed Weights   07/25/15 1415 07/26/15 0500 07/27/15 0600  Weight: 171 lb 8.3 oz (77.8 kg) 172 lb 9.9 oz (78.3 kg) 169 lb 5 oz (76.8 kg)    Weight change: -2 lb 3.3 oz (-1 kg)   Hemodynamic parameters for last 24 hours: PAP: (35)/(16-17) 35/17 mmHg CO:  [5.3 L/min] 5.3 L/min CI:  [2.8 L/min/m2] 2.8 L/min/m2  Intake/Output from previous day: 02/03 0701 - 02/04 0700 In: 1353.3 [P.O.:560; I.V.:693.3; IV Piggyback:100] Out: 2140 [Urine:2060; Chest Tube:80]  Intake/Output this shift:    Current Meds: Scheduled Meds: . acetaminophen  1,000 mg Oral 4 times per day   Or  . acetaminophen (TYLENOL) oral liquid 160 mg/5 mL  1,000 mg Per Tube 4 times per day  . aspirin EC  325 mg Oral Daily   Or  . aspirin  324 mg Per Tube Daily  . atorvastatin  40 mg Oral q1800  . bisacodyl  10 mg Oral Daily   Or  . bisacodyl  10 mg Rectal Daily  . cefUROXime (ZINACEF)  IV  1.5 g Intravenous Q12H  . docusate sodium  200 mg Oral Daily  . enoxaparin  (LOVENOX) injection  40 mg Subcutaneous QHS  . insulin aspart  0-24 Units Subcutaneous 6 times per day  . isosorbide mononitrate  15 mg Oral Daily  . metoprolol tartrate  12.5 mg Oral BID   Or  . metoprolol tartrate  12.5 mg Per Tube BID  . pantoprazole  40 mg Oral Daily  . sodium chloride flush  10-40 mL Intracatheter Q12H  . sodium chloride flush  3 mL Intravenous Q12H   Continuous Infusions: . sodium chloride 20 mL/hr at 07/26/15 0600  . sodium chloride 250 mL (07/26/15 0503)  . sodium chloride Stopped (07/25/15 1900)  . amiodarone 60 mg/hr (07/27/15 0620)   Followed by  . amiodarone    . dexmedetomidine Stopped (07/25/15 1735)  . lactated ringers Stopped (07/26/15 0700)  . lactated ringers 20 mL/hr at 07/26/15 0600  . phenylephrine (NEO-SYNEPHRINE) Adult infusion Stopped (07/26/15 0900)   PRN Meds:.sodium chloride, ketorolac, lactated ringers, metoprolol, midazolam, morphine injection, ondansetron (ZOFRAN) IV, oxyCODONE, sodium chloride flush, sodium chloride flush, traMADol  General appearance: alert, cooperative and no distress Neurologic: intact Heart: regular rate and rhythm, S1, S2 normal, no murmur, click, rub or gallop  Lungs: diminished breath sounds bibasilar Abdomen: soft, non-tender; bowel sounds normal; no masses,  no organomegaly Extremities: extremities normal, atraumatic, no cyanosis or edema and Homans sign is negative, no sign of DVT Wound: silver dressing in place, sternum stable Left hand neuro vascular intact Lab Results: CBC: Recent Labs  07/26/15 1541 07/26/15 1548 07/27/15 0500  WBC 15.2*  --  13.2*  HGB 10.7* 10.9* 9.5*  HCT 32.1* 32.0* 28.6*  PLT 183  --  159   BMET:  Recent Labs  07/26/15 0420  07/26/15 1548 07/27/15 0500  NA 137  --  136 136  K 4.1  --  4.1 4.4  CL 106  --  103 102  CO2 22  --   --  27  GLUCOSE 110*  --  154* 126*  BUN 9  --  9 10  CREATININE 1.02  < > 1.00 1.00  CALCIUM 8.2*  --   --  8.5*  < > = values in this  interval not displayed.  PT/INR:  Recent Labs  07/25/15 1400  LABPROT 16.8*  INR 1.35   Radiology: Dg Chest Port 1 View  07/27/2015  CLINICAL DATA:  Status post coronary artery bypass graft. EXAM: PORTABLE CHEST 1 VIEW COMPARISON:  July 26, 2015. FINDINGS: Stable cardiomegaly. Sternotomy wires noted. Bilateral chest tubes have been removed without pneumothorax. Right internal jugular Swan-Ganz catheter has been removed. Right internal jugular venous sheath remains. Stable bibasilar atelectasis is noted. Bony thorax is unremarkable. IMPRESSION: Bilateral chest tubes have been removed without pneumothorax. Stable bibasilar atelectasis is noted. Electronically Signed   By: Marijo Conception, M.D.   On: 07/27/2015 07:05     Assessment/Plan: S/P Procedure(s) (LRB): CORONARY ARTERY BYPASS GRAFTING X 3 UTILIZING BILATERAL IMA AND LEFT RADIAL ARTERY (N/A) RADIAL ARTERY HARVEST (Left) TRANSESOPHAGEAL ECHOCARDIOGRAM (TEE) (N/A) Mobilize D/c foley Episode of afib started on IV cordrone  21 beat run vt, ,check troponin, ekg done cardiology notified to return to see patient      Grace Isaac 07/27/2015 7:57 AM

## 2015-07-27 NOTE — Progress Notes (Signed)
CRITICAL VALUE ALERT  Critical value received:  Trop 1.02  Date of notification:  07/27/2015  Time of notification:  [id number]  Critical value read back:Yes.    Nurse who received alert:  Bobbe Medico, RN  MD notified (1st page):  Dr. Servando Snare   Time of first page:  1105  MD notified (2nd page):  Time of second page:  Responding MD:  Dr. Servando Snare.  Also notified PA Bhagat with cardiology per Dr. Everrett Coombe request.  Time MD responded:  [phone]

## 2015-07-27 NOTE — Progress Notes (Signed)
Subjective:  He feels well postoperatively.  Had some atrial fibrillation last night and was started on amiodarone.  2 runs of nonsustained V. tach, longest at 15 beats  Objective:  Vital Signs in the last 24 hours: BP 95/73 mmHg  Pulse 88  Temp(Src) 99 F (37.2 C) (Oral)  Resp 20  Ht 5\' 8"  (1.727 m)  Wt 76.8 kg (169 lb 5 oz)  BMI 25.75 kg/m2  SpO2 100%  Physical Exam:  Lungs:  Clear Cardiac:  Regular rhythm, normal S1 and S2, no S3 Abdomen:  Soft, nontender, no masses Extremities:  No edema present  Intake/Output from previous day: 02/03 0701 - 02/04 0700 In: 1375.5 [P.O.:560; I.V.:715.5; IV Piggyback:100] Out: 2140 [Urine:2060; Chest Tube:80]  Weight Filed Weights   07/25/15 1415 07/26/15 0500 07/27/15 0600  Weight: 77.8 kg (171 lb 8.3 oz) 78.3 kg (172 lb 9.9 oz) 76.8 kg (169 lb 5 oz)    Lab Results: Basic Metabolic Panel:  Recent Labs  07/26/15 0420  07/26/15 1548 07/27/15 0500  NA 137  --  136 136  K 4.1  --  4.1 4.4  CL 106  --  103 102  CO2 22  --   --  27  GLUCOSE 110*  --  154* 126*  BUN 9  --  9 10  CREATININE 1.02  < > 1.00 1.00  < > = values in this interval not displayed. CBC:  Recent Labs  07/26/15 1541 07/26/15 1548 07/27/15 0500  WBC 15.2*  --  13.2*  HGB 10.7* 10.9* 9.5*  HCT 32.1* 32.0* 28.6*  MCV 92.2  --  93.2  PLT 183  --  159   Cardiac Panel (last 3 results)  Recent Labs  07/27/15 0948  TROPONINI 1.02*    Telemetry: Some paroxysmal atrial fibrillation last night.  Currently in sinus rhythm.  2 runs of nonsustained V. tach, one of 4 beats in one of 15 beats  Assessment/Plan:  1.  Paroxysmal atrial fibrillation following bypass grafting currently in sinus rhythm 2.  Nonsustained ventricular tachycardia 3.  Minimal elevation of troponin likely represents residual troponin elevation.  Non-STEMI last week and also can be seen following bypass-I do not think this represents a new infarction 4.  Severe left main 3 vessel  disease  Recommendations:  Continue amiodarone overnight and increase beta blocker.  May progress as usual with therapies.     Kerry Hough  MD [facility]  07/27/2015, 12:08 PM

## 2015-07-27 NOTE — Progress Notes (Signed)
Patient ID: Jeffrey Spence, male   DOB: November 26, 1960, 55 y.o.   MRN: [id number] EVENING ROUNDS NOTE :     [address]       ,[place] [address]             [phone]                 2 Days Post-Op Procedure(s) (LRB): CORONARY ARTERY BYPASS GRAFTING X 3 UTILIZING BILATERAL IMA AND LEFT RADIAL ARTERY (N/A) RADIAL ARTERY HARVEST (Left) TRANSESOPHAGEAL ECHOCARDIOGRAM (TEE) (N/A)  Total Length of Stay:  LOS: 4 days  BP 105/68 mmHg  Pulse 92  Temp(Src) 98.1 F (36.7 C) (Oral)  Resp 18  Ht 5\' 8"  (1.727 m)  Wt 169 lb 5 oz (76.8 kg)  BMI 25.75 kg/m2  SpO2 98%  .Intake/Output      02/04 0701 - 02/05 0700   P.O. 420   I.V. (mL/kg) 564.3 (7.3)   IV Piggyback 50   Total Intake(mL/kg) 1034.3 (13.5)   Urine (mL/kg/hr) 760 (0.7)   Chest Tube    Total Output 760   Net +274.3         . sodium chloride 20 mL/hr at 07/26/15 0600  . sodium chloride 250 mL (07/26/15 0503)  . sodium chloride Stopped (07/25/15 1900)  . amiodarone 30 mg/hr (07/27/15 1819)  . dexmedetomidine Stopped (07/25/15 1735)  . lactated ringers Stopped (07/26/15 0700)  . lactated ringers 20 mL/hr at 07/26/15 0600  . phenylephrine (NEO-SYNEPHRINE) Adult infusion Stopped (07/26/15 0900)     Lab Results  Component Value Date   WBC 13.2* 07/27/2015   HGB 9.5* 07/27/2015   HCT 28.6* 07/27/2015   PLT 159 07/27/2015   GLUCOSE 126* 07/27/2015   CHOL 223* 07/19/2015   TRIG 127.0 07/19/2015   HDL 42.90 07/19/2015   LDLDIRECT 140.9 11/15/2012   LDLCALC 155* 07/19/2015   ALT 15* 07/27/2015   AST 22 07/27/2015   NA 136 07/27/2015   K 4.4 07/27/2015   CL 102 07/27/2015   CREATININE 1.00 07/27/2015   BUN 10 07/27/2015   CO2 27 07/27/2015   TSH 2.373 07/27/2015   PSA 0.89 11/15/2012   INR 1.35 07/25/2015   HGBA1C 5.5 07/23/2015    Lab Results  Component Value Date   TROPONINI 0.71* 07/27/2015    Stable   Grace Isaac MD  Beeper [phone] Office [phone] 07/27/2015 9:28 PM

## 2015-07-28 LAB — CBC
HCT: 26.2 % — ABNORMAL LOW (ref 39.0–52.0)
Hemoglobin: 8.8 g/dL — ABNORMAL LOW (ref 13.0–17.0)
MCH: 31.2 pg (ref 26.0–34.0)
MCHC: 33.6 g/dL (ref 30.0–36.0)
MCV: 92.9 fL (ref 78.0–100.0)
Platelets: 157 10*3/uL (ref 150–400)
RBC: 2.82 MIL/uL — ABNORMAL LOW (ref 4.22–5.81)
RDW: 13.7 % (ref 11.5–15.5)
WBC: 10.5 10*3/uL (ref 4.0–10.5)

## 2015-07-28 LAB — GLUCOSE, CAPILLARY
Glucose-Capillary: 106 mg/dL — ABNORMAL HIGH (ref 65–99)
Glucose-Capillary: 102 mg/dL — ABNORMAL HIGH (ref 65–99)
Glucose-Capillary: 112 mg/dL — ABNORMAL HIGH (ref 65–99)
Glucose-Capillary: 113 mg/dL — ABNORMAL HIGH (ref 65–99)
Glucose-Capillary: 104 mg/dL — ABNORMAL HIGH (ref 65–99)
Glucose-Capillary: 101 mg/dL — ABNORMAL HIGH (ref 65–99)

## 2015-07-28 LAB — BASIC METABOLIC PANEL
Anion gap: 10 (ref 5–15)
BUN: 10 mg/dL (ref 6–20)
CO2: 26 mmol/L (ref 22–32)
Calcium: 8.6 mg/dL — ABNORMAL LOW (ref 8.9–10.3)
Chloride: 101 mmol/L (ref 101–111)
Creatinine, Ser: 0.87 mg/dL (ref 0.61–1.24)
GFR calc Af Amer: >60 mL/min (ref >60)
GFR calc non Af Amer: >60 mL/min (ref >60)
Glucose, Bld: 113 mg/dL — ABNORMAL HIGH (ref 65–99)
Potassium: 4.1 mmol/L (ref 3.5–5.1)
Sodium: 137 mmol/L (ref 135–145)

## 2015-07-28 LAB — MAGNESIUM: Magnesium: 2.1 mg/dL (ref 1.7–2.4)

## 2015-07-28 MED ORDER — BISACODYL 10 MG RE SUPP
10.0000 mg | Freq: Once | RECTAL | Status: AC
  Administered 2015-07-28: 10 mg via RECTAL

## 2015-07-28 MED ORDER — AMIODARONE HCL 200 MG PO TABS: 400.0000 mg | ORAL_TABLET | Freq: Two times a day (BID) | ORAL | Status: DC

## 2015-07-28 MED ORDER — AMIODARONE HCL 200 MG PO TABS
400.0000 mg | ORAL_TABLET | Freq: Two times a day (BID) | ORAL | Status: DC
  Administered 2015-07-28 – 2015-07-31 (×7): 400 mg via ORAL
  Filled 2015-07-28 (×7): qty 2

## 2015-07-28 MED ORDER — AMIODARONE HCL 200 MG PO TABS: 400.0000 mg | ORAL_TABLET | Freq: Every day | ORAL | Status: DC

## 2015-07-28 MED ORDER — AMIODARONE HCL 200 MG PO TABS
400.0000 mg | ORAL_TABLET | Freq: Every day | ORAL | Status: DC
Start: 2015-08-05 — End: 2015-07-28

## 2015-07-28 NOTE — Plan of Care (Signed)
Problem: Activity: Goal: Risk for activity intolerance will decrease Outcome: Progressing Will ambulate the patient shortly.   Problem: Cardiac: Goal: Hemodynamic stability will improve Outcome: Progressing Hemodynamically stable.   Problem: Respiratory: Goal: Levels of oxygenation will improve Outcome: Progressing Patient is on 2 liters of oxygen, weaned to 1 liter of oxygen.   Problem: Pain Management: Goal: Pain level will decrease Outcome: Progressing Pain has been under very good control, didn't need any prn pain medication.

## 2015-07-28 NOTE — Progress Notes (Signed)
Subjective:  C/o SOB and chest wall pain.  No more arryhthmias.  Objective:  Vital Signs in the last 24 hours: BP 116/77 mmHg  Pulse 91  Temp(Src) 97.7 F (36.5 C) (Oral)  Resp 26  Ht 5\' 8"  (1.727 m)  Wt 75.6 kg (166 lb 10.7 oz)  BMI 25.35 kg/m2  SpO2 100%  Physical Exam: Pleasant WM coughing and pale Lungs:  Reduced BS Extremities:  1+ edema present  Intake/Output from previous day: 02/04 0701 - 02/05 0700 In: 1337.9 [P.O.:420; I.V.:867.9; IV Piggyback:50] Out: 1460 [Urine:1460]  Weight Filed Weights   07/26/15 0500 07/27/15 0600 07/28/15 0600  Weight: 78.3 kg (172 lb 9.9 oz) 76.8 kg (169 lb 5 oz) 75.6 kg (166 lb 10.7 oz)    Lab Results: Basic Metabolic Panel:  Recent Labs  07/27/15 0500 07/28/15 0348  NA 136 137  K 4.4 4.1  CL 102 101  CO2 27 26  GLUCOSE 126* 113*  BUN 10 10  CREATININE 1.00 0.87   CBC:  Recent Labs  07/27/15 0500 07/28/15 0348  WBC 13.2* 10.5  HGB 9.5* 8.8*  HCT 28.6* 26.2*  MCV 93.2 92.9  PLT 159 157   Cardiac Panel (last 3 results)  Recent Labs  07/27/15 0948 07/27/15 1417 07/27/15 2018  TROPONINI 1.02* 0.88* 0.71*    Telemetry: Some paroxysmal atrial fibrillation last night.  Currently in sinus rhythm.  2 runs of nonsustained V. tach, one of 4 beats in one of 15 beats  Assessment/Plan:  1.  Paroxysmal atrial fibrillation following bypass grafting currently in sinus rhythm 2.  Nonsustained ventricular tachycardia resolved 3.  Minimal elevation of troponin likely represents residual troponin elevation.  Non-STEMI last week and also can be seen following bypass-I do not think this represents a new infarction 4.  Severe left main 3 vessel disease  Recommendations:  OK to go to floor.  Continue beta blocker.  Transition to po amiodarone.    Kerry Hough  MD [facility]  07/28/2015, 9:56 AM

## 2015-07-28 NOTE — Progress Notes (Signed)
Patient ID: Jeffrey Spence, male   DOB: 06/09/1961, 55 y.o.   MRN: [id number] TCTS DAILY ICU PROGRESS NOTE                   [address]            [place] [address]          [phone]   3 Days Post-Op Procedure(s) (LRB): CORONARY ARTERY BYPASS GRAFTING X 3 UTILIZING BILATERAL IMA AND LEFT RADIAL ARTERY (N/A) RADIAL ARTERY HARVEST (Left) TRANSESOPHAGEAL ECHOCARDIOGRAM (TEE) (N/A)  Total Length of Stay:  LOS: 5 days   Subjective: Alert and awake, walked 600 feet. Slow moving how ever, no further vt or a fib  Objective: Vital signs in last 24 hours: Temp:  [97.6 F (36.4 C)-99.2 F (37.3 C)] 97.6 F (36.4 C) (02/05 0437) Pulse Rate:  [81-100] 96 (02/05 0700) Cardiac Rhythm:  [-] Normal sinus rhythm (02/05 0400) Resp:  [15-35] 30 (02/05 0700) BP: (87-111)/(58-75) 104/69 mmHg (02/05 0700) SpO2:  [94 %-100 %] 95 % (02/05 0700) Weight:  [166 lb 10.7 oz (75.6 kg)] 166 lb 10.7 oz (75.6 kg) (02/05 0600)  Filed Weights   07/26/15 0500 07/27/15 0600 07/28/15 0600  Weight: 172 lb 9.9 oz (78.3 kg) 169 lb 5 oz (76.8 kg) 166 lb 10.7 oz (75.6 kg)    Weight change: -2 lb 10.3 oz (-1.2 kg)   Hemodynamic parameters for last 24 hours:    Intake/Output from previous day: 02/04 0701 - 02/05 0700 In: 1337.9 [P.O.:420; I.V.:867.9; IV Piggyback:50] Out: 1460 [Urine:1460]  Intake/Output this shift: Total I/O In: -  Out: 100 [Urine:100]  Current Meds: Scheduled Meds: . acetaminophen  1,000 mg Oral 4 times per day   Or  . acetaminophen (TYLENOL) oral liquid 160 mg/5 mL  1,000 mg Per Tube 4 times per day  . antiseptic oral rinse  7 mL Mouth Rinse BID  . aspirin EC  325 mg Oral Daily   Or  . aspirin  324 mg Per Tube Daily  . atorvastatin  40 mg Oral q1800  . bisacodyl  10 mg Oral Daily   Or  . bisacodyl  10 mg Rectal Daily  . docusate sodium  200 mg Oral Daily  . enoxaparin (LOVENOX) injection  40 mg Subcutaneous QHS  . insulin aspart  0-24 Units Subcutaneous 6  times per day  . isosorbide mononitrate  15 mg Oral Daily  . metoprolol tartrate  12.5 mg Oral BID   Or  . metoprolol tartrate  12.5 mg Per Tube BID  . pantoprazole  40 mg Oral Daily  . sodium chloride flush  10-40 mL Intracatheter Q12H  . sodium chloride flush  3 mL Intravenous Q12H   Continuous Infusions: . sodium chloride 20 mL/hr at 07/26/15 0600  . sodium chloride 250 mL (07/26/15 0503)  . sodium chloride Stopped (07/25/15 1900)  . amiodarone 30 mg/hr (07/28/15 0612)  . dexmedetomidine Stopped (07/25/15 1735)  . lactated ringers Stopped (07/26/15 0700)  . lactated ringers 20 mL/hr at 07/26/15 0600  . phenylephrine (NEO-SYNEPHRINE) Adult infusion Stopped (07/26/15 0900)   PRN Meds:.sodium chloride, ketorolac, lactated ringers, metoprolol, midazolam, morphine injection, ondansetron (ZOFRAN) IV, oxyCODONE, sodium chloride flush, sodium chloride flush, traMADol  General appearance: alert, cooperative and no distress Neurologic: intact Heart: regular rate and rhythm, S1, S2 normal, no murmur, click, rub or gallop Lungs: diminished breath sounds bibasilar Abdomen: soft, non-tender; bowel sounds normal; no masses,  no organomegaly Extremities: extremities normal,  atraumatic, no cyanosis or edema and Homans sign is negative, no sign of DVT Wound: sternum stable silver dressing in place  Lab Results: CBC:  Recent Labs  07/27/15 0500 07/28/15 0348  WBC 13.2* 10.5  HGB 9.5* 8.8*  HCT 28.6* 26.2*  PLT 159 157   BMET:   Recent Labs  07/27/15 0500 07/28/15 0348  NA 136 137  K 4.4 4.1  CL 102 101  CO2 27 26  GLUCOSE 126* 113*  BUN 10 10  CREATININE 1.00 0.87  CALCIUM 8.5* 8.6*    PT/INR:   Recent Labs  07/25/15 1400  LABPROT 16.8*  INR 1.35   Radiology: No results found.   Assessment/Plan: S/P Procedure(s) (LRB): CORONARY ARTERY BYPASS GRAFTING X 3 UTILIZING BILATERAL IMA AND LEFT RADIAL ARTERY (N/A) RADIAL ARTERY HARVEST (Left) TRANSESOPHAGEAL  ECHOCARDIOGRAM (TEE) (N/A) Mobilize Diuresis d/c tubes/lines Expected Acute  Blood - loss Anemia Renal function stable Convert to po cordrone and d/c central line To circle when bed   Grace Isaac 07/28/2015 7:57 AM

## 2015-07-29 ENCOUNTER — Inpatient Hospital Stay (HOSPITAL_COMMUNITY): Payer: 59

## 2015-07-29 DIAGNOSIS — I25110 Atherosclerotic heart disease of native coronary artery with unstable angina pectoris: Secondary | ICD-10-CM

## 2015-07-29 LAB — BASIC METABOLIC PANEL
Anion gap: 10 (ref 5–15)
BUN: 14 mg/dL (ref 6–20)
CO2: 26 mmol/L (ref 22–32)
Calcium: 8.5 mg/dL — ABNORMAL LOW (ref 8.9–10.3)
Chloride: 101 mmol/L (ref 101–111)
Creatinine, Ser: 0.79 mg/dL (ref 0.61–1.24)
GFR calc Af Amer: >60 mL/min (ref >60)
GFR calc non Af Amer: >60 mL/min (ref >60)
Glucose, Bld: 99 mg/dL (ref 65–99)
Potassium: 3.7 mmol/L (ref 3.5–5.1)
Sodium: 137 mmol/L (ref 135–145)

## 2015-07-29 LAB — GLUCOSE, CAPILLARY
Glucose-Capillary: 105 mg/dL — ABNORMAL HIGH (ref 65–99)
Glucose-Capillary: 120 mg/dL — ABNORMAL HIGH (ref 65–99)

## 2015-07-29 LAB — CBC
HCT: 26.6 % — ABNORMAL LOW (ref 39.0–52.0)
Hemoglobin: 9.1 g/dL — ABNORMAL LOW (ref 13.0–17.0)
MCH: 31.6 pg (ref 26.0–34.0)
MCHC: 34.2 g/dL (ref 30.0–36.0)
MCV: 92.4 fL (ref 78.0–100.0)
Platelets: 222 10*3/uL (ref 150–400)
RBC: 2.88 MIL/uL — ABNORMAL LOW (ref 4.22–5.81)
RDW: 13.9 % (ref 11.5–15.5)
WBC: 8.8 10*3/uL (ref 4.0–10.5)

## 2015-07-29 MED ORDER — SODIUM CHLORIDE 0.9% FLUSH
3.0000 mL | Freq: Two times a day (BID) | INTRAVENOUS | Status: DC
  Administered 2015-07-29 – 2015-07-31 (×5): 3 mL via INTRAVENOUS

## 2015-07-29 MED ORDER — SODIUM CHLORIDE 0.9% FLUSH: 3.0000 mL | INTRAVENOUS | Status: DC | PRN

## 2015-07-29 MED ORDER — MOVING RIGHT ALONG BOOK
Freq: Once | Status: AC
  Administered 2015-07-29: 1
  Filled 2015-07-29: qty 1

## 2015-07-29 MED ORDER — METOPROLOL TARTRATE 25 MG PO TABS
25.0000 mg | ORAL_TABLET | Freq: Two times a day (BID) | ORAL | Status: DC
  Administered 2015-07-29 – 2015-07-31 (×5): 25 mg via ORAL
  Filled 2015-07-29 (×5): qty 1

## 2015-07-29 MED ORDER — MAGNESIUM HYDROXIDE 400 MG/5ML PO SUSP: 30.0000 mL | Freq: Every day | ORAL | Status: DC | PRN

## 2015-07-29 MED ORDER — POTASSIUM CHLORIDE CRYS ER 20 MEQ PO TBCR
40.0000 meq | EXTENDED_RELEASE_TABLET | Freq: Two times a day (BID) | ORAL | Status: AC
Start: 2015-07-29 — End: 2015-07-29
  Administered 2015-07-29 (×2): 40 meq via ORAL
  Filled 2015-07-29 (×2): qty 2

## 2015-07-29 MED ORDER — ZOLPIDEM TARTRATE 5 MG PO TABS: 5.0000 mg | ORAL_TABLET | Freq: Every evening | ORAL | Status: DC | PRN

## 2015-07-29 MED ORDER — METOPROLOL TARTRATE 25 MG/10 ML ORAL SUSPENSION
25.0000 mg | Freq: Two times a day (BID) | ORAL | Status: DC
  Filled 2015-07-29 (×2): qty 10

## 2015-07-29 MED ORDER — ATORVASTATIN CALCIUM 80 MG PO TABS
80.0000 mg | ORAL_TABLET | Freq: Every day | ORAL | Status: DC
  Administered 2015-07-29 – 2015-07-30 (×2): 80 mg via ORAL
  Filled 2015-07-29 (×2): qty 1

## 2015-07-29 MED ORDER — ALPRAZOLAM 0.25 MG PO TABS: 0.2500 mg | ORAL_TABLET | Freq: Four times a day (QID) | ORAL | Status: DC | PRN

## 2015-07-29 MED ORDER — SODIUM CHLORIDE 0.9 % IV SOLN: 250.0000 mL | INTRAVENOUS | Status: DC | PRN

## 2015-07-29 MED ORDER — GUAIFENESIN ER 600 MG PO TB12
1200.0000 mg | ORAL_TABLET | Freq: Two times a day (BID) | ORAL | Status: DC
  Administered 2015-07-29 – 2015-07-31 (×5): 1200 mg via ORAL
  Filled 2015-07-29 (×5): qty 2

## 2015-07-29 MED ORDER — ALUM & MAG HYDROXIDE-SIMETH 200-200-20 MG/5ML PO SUSP: 15.0000 mL | ORAL | Status: DC | PRN

## 2015-07-29 NOTE — Progress Notes (Signed)
Attempted to call report to 2W26; RN unable to take report; will try again at a later time.  Jeffrey Spence

## 2015-07-29 NOTE — Progress Notes (Signed)
Pt transported to 2W26 via wheelchair; chair, SCD's, and pt belongings along with transfer; pt denies pain at this time; RN and NT in room upon transfer.  Jeffrey Spence

## 2015-07-29 NOTE — Progress Notes (Signed)
4 Days Post-Op Procedure(s) (LRB): CORONARY ARTERY BYPASS GRAFTING X 3 UTILIZING BILATERAL IMA AND LEFT RADIAL ARTERY (N/A) RADIAL ARTERY HARVEST (Left) TRANSESOPHAGEAL ECHOCARDIOGRAM (TEE) (N/A) Subjective: C/o incisional pain  Objective: Vital signs in last 24 hours: Temp:  [97.5 F (36.4 C)-98 F (36.7 C)] 97.9 F (36.6 C) (02/06 0405) Pulse Rate:  [80-98] 94 (02/06 0700) Cardiac Rhythm:  [-] Normal sinus rhythm (02/06 0730) Resp:  [13-33] 26 (02/06 0700) BP: (96-128)/(63-81) 115/80 mmHg (02/06 0700) SpO2:  [88 %-100 %] 99 % (02/06 0700) Weight:  [159 lb 9.8 oz (72.4 kg)] 159 lb 9.8 oz (72.4 kg) (02/06 0500)  Hemodynamic parameters for last 24 hours:    Intake/Output from previous day: 02/05 0701 - 02/06 0700 In: 2270.7 [P.O.:400; I.V.:1870.7] Out: 925 [Urine:925] Intake/Output this shift: Total I/O In: 120 [P.O.:120] Out: -   General appearance: alert, cooperative and no distress Neurologic: intact Heart: regular rate and rhythm Lungs: diminished breath sounds bibasilar Abdomen: normal findings: soft, non-tender Wound: clean and dry  Lab Results:  Recent Labs  07/28/15 0348 07/29/15 0352  WBC 10.5 8.8  HGB 8.8* 9.1*  HCT 26.2* 26.6*  PLT 157 222   BMET:  Recent Labs  07/28/15 0348 07/29/15 0352  NA 137 137  K 4.1 3.7  CL 101 101  CO2 26 26  GLUCOSE 113* 99  BUN 10 14  CREATININE 0.87 0.79  CALCIUM 8.6* 8.5*    PT/INR: No results for input(s): LABPROT, INR in the last 72 hours. ABG    Component Value Date/Time   PHART 7.358 07/25/2015 1847   HCO3 20.5 07/25/2015 1847   TCO2 22 07/26/2015 1548   ACIDBASEDEF 4.0* 07/25/2015 1847   O2SAT 97.0 07/25/2015 1847   CBG (last 3)   Recent Labs  07/28/15 2043 07/28/15 2353 07/29/15 0404  GLUCAP 101* 120* 105*    Assessment/Plan: S/P Procedure(s) (LRB): CORONARY ARTERY BYPASS GRAFTING X 3 UTILIZING BILATERAL IMA AND LEFT RADIAL ARTERY (N/A) RADIAL ARTERY HARVEST (Left) TRANSESOPHAGEAL  ECHOCARDIOGRAM (TEE) (N/A) Plan for transfer to step-down: see transfer orders  POD # 3 CABG  CV- no further arrhythmias- continue PO amiodarone, increase lopressor to 25 BID  Continue ASA, statin, Imdur  RESP- bibasilar atelectasis- continue IS, flutter, add mucinex to help with secretions  RENAL- creatinine and lytes OK, supplement K  ENDO- CBG OK, dc CBG/ SSI  SCD + enoxaparin for DVT prophylaxis  Transfer to 2 west   LOS: 6 days    Melrose Nakayama 07/29/2015

## 2015-07-29 NOTE — Plan of Care (Signed)
Problem: Education: Goal: Knowledge of disease or condition will improve Outcome: Progressing Pt verbalized that he will no longer smoke

## 2015-07-29 NOTE — Plan of Care (Signed)
Problem: Activity: Goal: Risk for activity intolerance will decrease Outcome: Progressing Patient ambulated two rounds (600 feet) on 4 liters of oxygen since he desats to mid 80's on 2 liters. Patient is on 1 liter of oxygen when resting in room.   Problem: Bowel/Gastric: Goal: Gastrointestinal status for postoperative course will improve Outcome: Progressing Patient is passing gas, last bm was yesterday.   Problem: Pain Management: Goal: Pain level will decrease Outcome: Progressing Patient didn't need any prn pain medication.

## 2015-07-29 NOTE — Progress Notes (Signed)
Report called to 2W; RN to transfer pt after bath at this time; will cont. To monitor; wife called to make aware of transfer.  Jeffrey Spence

## 2015-07-29 NOTE — Anesthesia Postprocedure Evaluation (Signed)
Anesthesia Post Note  Patient: Jeffrey Spence  Procedure(s) Performed: Procedure(s) (LRB): CORONARY ARTERY BYPASS GRAFTING X 3 UTILIZING BILATERAL IMA AND LEFT RADIAL ARTERY (N/A) RADIAL ARTERY HARVEST (Left) TRANSESOPHAGEAL ECHOCARDIOGRAM (TEE) (N/A)  Patient location during evaluation: ICU Anesthesia Type: General Level of consciousness: sedated Pain management: pain level controlled Vital Signs Assessment: post-procedure vital signs reviewed and stable Respiratory status: patient remains intubated per anesthesia plan Cardiovascular status: stable Postop Assessment: no signs of nausea or vomiting Anesthetic complications: no    Last Vitals:  Filed Vitals:   07/29/15 0700 07/29/15 0800  BP: 115/80 120/82  Pulse: 94 91  Temp:    Resp: 26 20    Last Pain:  Filed Vitals:   07/29/15 0806  PainSc: 0-No pain                 Kainoah Bartosiewicz

## 2015-07-29 NOTE — Progress Notes (Signed)
CARDIAC REHAB PHASE I   PRE:  Rate/Rhythm: 90 SR  BP:  Sitting: 93/60        SaO2: 96 RA  MODE:  Ambulation: 460 ft   POST:  Rate/Rhythm: 95 SR  BP:  Sitting: 115/68         SaO2: 96 RA  Pt sitting up in recliner finishing lunch, assisted to bathroom, states he walked two laps around the ICU pushing a wheelchair earlier this morning. Stood with no assistance, good use of sternal precautions. Pt ambulated 460 ft on RA, rolling walker, hand held assist, very slow but steady gait, tolerated well with no complaints. Pt to bed per pt request after walk, call bell within reach. Encouraged IS, ambulation x1 more today. Will follow.   [id number] Lenna Sciara, RN, BSN 07/29/2015 1:26 PM

## 2015-07-29 NOTE — Progress Notes (Signed)
Subjective:  Denies SOB; CP with cough  Objective:  Vital Signs in the last 24 hours: BP 120/82 mmHg  Pulse 91  Temp(Src) 97.9 F (36.6 C) (Oral)  Resp 20  Ht 5\' 8"  (1.727 m)  Wt 159 lb 9.8 oz (72.4 kg)  BMI 24.27 kg/m2  SpO2 99%  Physical Exam: WD/WN NAD Neck supple Chest CTA CV RRR Abd mildly distended; no tenderness Ext no edema Neuro grossly intact  Intake/Output from previous day: 02/05 0701 - 02/06 0700 In: 2270.7 [P.O.:400; I.V.:1870.7] Out: 925 [Urine:925]  Weight Filed Weights   07/27/15 0600 07/28/15 0600 07/29/15 0500  Weight: 169 lb 5 oz (76.8 kg) 166 lb 10.7 oz (75.6 kg) 159 lb 9.8 oz (72.4 kg)    Lab Results: Basic Metabolic Panel:  Recent Labs  07/28/15 0348 07/29/15 0352  NA 137 137  K 4.1 3.7  CL 101 101  CO2 26 26  GLUCOSE 113* 99  BUN 10 14  CREATININE 0.87 0.79   CBC:  Recent Labs  07/28/15 0348 07/29/15 0352  WBC 10.5 8.8  HGB 8.8* 9.1*  HCT 26.2* 26.6*  MCV 92.9 92.4  PLT 157 222   Cardiac Panel (last 3 results)  Recent Labs  07/27/15 0948 07/27/15 1417 07/27/15 2018  TROPONINI 1.02* 0.88* 0.71*    Telemetry: Sinus personally reviewed  Assessment/Plan:  1.  Paroxysmal atrial fibrillation following bypass grafting currently in sinus rhythm 2.  Nonsustained ventricular tachycardia resolved 3.  Severe left main 3 vessel disease s/p CABG  Recommendations:  Patient remains in sinus; no further atrial fibrillation or VT; plan continue amiodarone 400 BID for one week and then 200 mg daily thereafter. DC in 6-8 weeks if he holds sinus rhythm. Continue metoprolol. Change lipitor to 80 mg daily.   Kirk Ruths  MD [facility]  07/29/2015, 8:29 AM

## 2015-07-29 NOTE — Plan of Care (Signed)
Problem: Coping: Goal: Ability to adjust to condition or change in health will improve Outcome: Progressing Pt appears to be mildly depressed

## 2015-07-30 ENCOUNTER — Inpatient Hospital Stay (HOSPITAL_COMMUNITY): Payer: 59

## 2015-07-30 LAB — BASIC METABOLIC PANEL
Anion gap: 11 (ref 5–15)
BUN: 13 mg/dL (ref 6–20)
CO2: 23 mmol/L (ref 22–32)
Calcium: 8.8 mg/dL — ABNORMAL LOW (ref 8.9–10.3)
Chloride: 101 mmol/L (ref 101–111)
Creatinine, Ser: 0.87 mg/dL (ref 0.61–1.24)
GFR calc Af Amer: >60 mL/min (ref >60)
GFR calc non Af Amer: >60 mL/min (ref >60)
Glucose, Bld: 101 mg/dL — ABNORMAL HIGH (ref 65–99)
Potassium: 4.3 mmol/L (ref 3.5–5.1)
Sodium: 135 mmol/L (ref 135–145)

## 2015-07-30 LAB — CBC
HCT: 27 % — ABNORMAL LOW (ref 39.0–52.0)
Hemoglobin: 8.8 g/dL — ABNORMAL LOW (ref 13.0–17.0)
MCH: 29.9 pg (ref 26.0–34.0)
MCHC: 32.6 g/dL (ref 30.0–36.0)
MCV: 91.8 fL (ref 78.0–100.0)
Platelets: 310 10*3/uL (ref 150–400)
RBC: 2.94 MIL/uL — ABNORMAL LOW (ref 4.22–5.81)
RDW: 14 % (ref 11.5–15.5)
WBC: 7.7 10*3/uL (ref 4.0–10.5)

## 2015-07-30 MED ORDER — ISOSORBIDE MONONITRATE ER 30 MG PO TB24
15.0000 mg | ORAL_TABLET | Freq: Every day | ORAL | Status: DC
  Administered 2015-07-30 – 2015-07-31 (×2): 15 mg via ORAL
  Filled 2015-07-30 (×2): qty 1

## 2015-07-30 NOTE — Discharge Summary (Signed)
Physician Discharge Summary       [address]       [place] [address]             [phone]    Patient ID: Jeffrey Spence MRN: [id number] DOB/AGE: [birth year] 55 y.o.  Admit date: 07/23/2015 Discharge date: 07/31/2015  Admission Diagnoses: 1. S/p NSTEMI ([facility]) 2. Multivessel CAD  Discharge Diagnoses:  LEFT MAIN CAD/ 3 VESSEL CAD S/p NSTEMI 1. Hyperlipidemia 2. Tobacco abuse 3. Arthritis 4. A fib (converted to sinus rhythm prior to discharge) 5. Brief NSVT 6. ABL anemia  Procedure (s):  Left Heart Cath and Coronary Angiography by Dr. Rockey Situ on 07/23/2015:    Conclusion     Ost RCA to Prox RCA lesion, 90% stenosed.  Ost LM lesion, 90% stenosed.  Ost LAD to Prox LAD lesion, 90% stenosed.  Prox Cx to Mid Cx lesion, 80% stenosed.  The left ventricular systolic function is normal.     Median sternotomy, extracorporeal circulation, coronary artery bypass grafting x3 (left internal mammary artery to LAD, right internal mammary artery to distal right coronary, left radial artery to OM 1).  History of Presenting Illness: This is a 55 yo man with a history of tobacco abuse and a strong family history of CAD who presents with a chief complaint of chest pain.  He has no known cardiac history. He first noted chest pain on 1/26 wile at work. He was carrying about 5 pounds when he felt a constriction in his chest. It improved with rest but did not totally resolve for about an hour. He went to the doctor the following day but his ECG and labs were remarkable only for an LDL of 151. He had no issues the next couple of days but he developed recurrent chest tightness on 1/30 and went to the ED. His initial troponin was 0.07 and rose to 1.49. A CT chest showed a small gallbladder polyp but was otherwise unremarkable. He has not had any further CP since admission.  Heunderwent cardiac catheterization which revealed severe left main and 3 vessel CAD. His EF was  normal. He is a good candidate for all arterial revascularization given age, not diabetic and normal Allen's test on left.  Dr. Roxan Hockey discussed the proposed procedure with Jeffrey Spence including the general nature of the procedure, the need for general anesthesia, the use of cardiopulmonary bypass, and the incisions to be used. He discussed the use of multiple arterial grafts including use of the left radial. Dr. Roxan Hockey discussed the expected hospital stay, overall recovery and short and long term outcomes. Finally, he reviewed the indications, risks, benefits and alternatives.  Patient agreed to proceed with surgery. He underwent a CABG x 3 on 07/25/2015.  Brief Hospital Course:  The patient was extubated the evening of surgery without difficulty. He remained afebrile and hemodynamically stable. Gordy Councilman, a line, chest tubes, and foley were removed early in the post operative course. Lopressor was started and titrated accordingly. He was put on Imdur for radial artery graft. He was volume over loaded and diuresed. He went into a fib and later had 2 runs of NSVT. He was put on Amiodarone. He had ABL anemia. His last H and H was 8.8 and 27.He did not require a post op transfusion. He was weaned off the insulin drip. . The patient's HGA1C pre op was 5.5. The patient was felt surgically stable for transfer from the ICU to PCTU for further convalescence on 07/29/2015. He  continues to progress with cardiac rehab. He was ambulating on room air. He has been tolerating a diet and has had a bowel movement. Epicardial pacing wires and chest tube sutures will be removed prior to discharge. The patient is felt surgically stable for discharge today.   Latest Vital Signs: Blood pressure 102/60, pulse 84, temperature 98.1 F (36.7 C), temperature source Oral, resp. rate 18, height 5\' 8"  (1.727 m), weight 161 lb 1.6 oz (73.074 kg), SpO2 97 %.  Physical Exam: General appearance: alert, cooperative and no  distress Heart: regular rate and rhythm Lungs: clear to auscultation bilaterally Abdomen: soft, non-tender; bowel sounds normal; no masses, no organomegaly Extremities: edema trace Wound: clean and dry  Discharge Condition:Stable and discharged to home  Recent laboratory studies:  Lab Results  Component Value Date   WBC 7.7 07/30/2015   HGB 8.8* 07/30/2015   HCT 27.0* 07/30/2015   MCV 91.8 07/30/2015   PLT 310 07/30/2015   Lab Results  Component Value Date   NA 135 07/30/2015   K 4.3 07/30/2015   CL 101 07/30/2015   CO2 23 07/30/2015   CREATININE 0.87 07/30/2015   GLUCOSE 101* 07/30/2015   Diagnostic Studies: Dg Chest 2 View  07/30/2015  CLINICAL DATA:  Chest pain EXAM: CHEST  2 VIEW COMPARISON:  07/29/2015 FINDINGS: Cardiac shadow is again enlarged but stable. Postsurgical changes are again noted. Bibasilar atelectatic changes with small effusions are noted. The overall appearance is stable from the prior study. No new bony abnormality is noted. IMPRESSION: Stable bibasilar changes.  No new focal abnormality is noted. Electronically Signed   By: Inez Catalina M.D.   On: 07/30/2015 08:01   Ct Abdomen Pelvis W Contrast  07/23/2015  CLINICAL DATA:  Abdominal pain and bloating EXAM: CT ABDOMEN AND PELVIS WITH CONTRAST TECHNIQUE: Multidetector CT imaging of the abdomen and pelvis was performed using the standard protocol following bolus administration of intravenous contrast. CONTRAST:  158mL OMNIPAQUE IOHEXOL 300 MG/ML  SOLN COMPARISON:  None. FINDINGS: Lower chest and abdominal wall: Fatty umbilical hernia, small. Fatty bilateral inguinal hernia, likely both direct and indirect. Extensive coronary atherosclerotic calcification for age Mild dependent atelectasis Hepatobiliary: No focal liver abnormality.Layering calcified cholelithiasis. There is also an anti dependent nodule which does not appear calcified and is likely a 86mm polyp. No evidence of acute cholecystitis. Pancreas:  Unremarkable. Spleen: Unremarkable. Adrenals/Urinary Tract: Negative adrenals. No hydronephrosis or stone. Tiny left renal cyst. Unremarkable bladder. Reproductive:No pathologic findings. Stomach/Bowel:  No obstruction. No appendicitis. Vascular/Lymphatic: No acute vascular abnormality. No mass or adenopathy. Peritoneal: No ascites or pneumoperitoneum. Musculoskeletal: No acute abnormalities. IMPRESSION: 1. No acute finding. 2. Cholelithiasis and probable 6 mm gallbladder polyp. 3. Extensive coronary atherosclerosis. 4. Fatty inguinal and umbilical hernias. Electronically Signed   By: Monte Fantasia M.D.   On: 07/23/2015 04:35    Dg Shoulder Left  07/19/2015  CLINICAL DATA:  Left shoulder pain EXAM: LEFT SHOULDER - 2+ VIEW COMPARISON:  None. FINDINGS: Three views of the left shoulder submitted. No acute fracture or subluxation. No radiopaque foreign body. IMPRESSION: Negative. Electronically Signed   By: Lahoma Crocker M.D.   On: 07/19/2015 16:27       Discharge Instructions    Amb Referral to Cardiac Rehabilitation    Complete by:  As directed   Diagnosis:  CABG          Discharge Medications:   Medication List    STOP taking these medications        aspirin  81 MG chewable tablet  Replaced by:  aspirin 325 MG EC tablet      TAKE these medications        amiodarone 400 MG tablet  Commonly known as:  PACERONE  Take 1 tablet (400 mg total) by mouth every 12 (twelve) hours. For 4 Days, then decrease to 400 mg once a day     aspirin 325 MG EC tablet  Take 1 tablet (325 mg total) by mouth daily.     atorvastatin 80 MG tablet  Commonly known as:  LIPITOR  Take 1 tablet (80 mg total) by mouth daily at 6 PM.     guaiFENesin 600 MG 12 hr tablet  Commonly known as:  MUCINEX  Take 2 tablets (1,200 mg total) by mouth 2 (two) times daily as needed.     heparin 100-0.45 UNIT/ML-% infusion  Inject 950 Units/hr into the vein continuous.     isosorbide mononitrate 30 MG 24 hr tablet    Commonly known as:  IMDUR  Take 0.5 tablets (15 mg total) by mouth daily.     metoprolol tartrate 25 MG tablet  Commonly known as:  LOPRESSOR  Take 1 tablet (25 mg total) by mouth 2 (two) times daily.     nicotine 21 mg/24hr patch  Commonly known as:  NICODERM CQ - dosed in mg/24 hours  Place 1 patch (21 mg total) onto the skin daily.     oxyCODONE 5 MG immediate release tablet  Commonly known as:  Oxy IR/ROXICODONE  Take 1-2 tablets (5-10 mg total) by mouth every 3 (three) hours as needed for severe pain.     traMADol 50 MG tablet  Commonly known as:  ULTRAM  Take 1-2 tablets (50-100 mg total) by mouth every 4 (four) hours as needed for moderate pain.       The patient has been discharged on:   1.Beta Blocker:  Yes [ x  ]                              No   [   ]                              If No, reason:  2.Ace Inhibitor/ARB: Yes [   ]                                     No  [  x  ]                                     If No, reason: labile BP  3.Statin:   Yes [ x  ]                  No  [   ]                  If No, reason:  4.Ecasa:  Yes  [x   ]                  No   [   ]                  If No, reason:  Follow Up  Appointments: Follow-up Information    Follow up with DUNN,RYAN, PA-C On 08/08/2015.   Specialties:  Physician Assistant, Cardiology, Radiology   Why:  Appointment time is at 9:00 am   Contact information:   [place] [address] [phone]       Follow up with Melrose Nakayama, MD On 08/27/2015.   Specialty:  Cardiothoracic Surgery   Why:  PA/LAT CXR to be taken (at [facility] which is in the same building as Dr. Leonarda Salon office) on 08/27/2015 at 9:15 am;Appointment time is at 10:00 am   Contact information:   [place] [address] [phone]       Signed: Cinda Quest 07/31/2015, 7:47 AM

## 2015-07-30 NOTE — Discharge Instructions (Signed)
Activity: 1.May walk up steps                2.No lifting more than ten pounds for four weeks.                 3.No driving for four weeks.                4.Stop any activity that causes chest pain, shortness of breath, dizziness, sweating or excessive weakness.                5.Avoid straining.                6.Continue with your breathing exercises daily.  Diet: Diabetic, low fat, Low salt diet  Wound Care: May shower.  Clean wounds with mild soap and water daily. Contact the office at [phone] if any problems arise.  Coronary Artery Bypass Grafting, Care After Refer to this sheet in the next few weeks. These instructions provide you with information on caring for yourself after your procedure. Your health care provider may also give you more specific instructions. Your treatment has been planned according to current medical practices, but problems sometimes occur. Call your health care provider if you have any problems or questions after your procedure. WHAT TO EXPECT AFTER THE PROCEDURE Recovery from surgery will be different for everyone. Some people feel well after 3 or 4 weeks, while for others it takes longer. After your procedure, it is typical to have the following:  Nausea and a lack of appetite.   Constipation.  Weakness and fatigue.   Depression or irritability.   Pain or discomfort at your incision site. HOME CARE INSTRUCTIONS  Take medicines only as directed by your health care provider. Do not stop taking medicines or start any new medicines without first checking with your health care provider.  Take your pulse as directed by your health care provider.  Perform deep breathing as directed by your health care provider. If you were given a device called an incentive spirometer, use it to practice deep breathing several times a day. Support your chest with a pillow or your arms when you take deep breaths or cough.  Keep incision areas clean, dry, and protected.  Remove or change any bandages (dressings) only as directed by your health care provider. You may have skin adhesive strips over the incision areas. Do not take the strips off. They will fall off on their own.  Check incision areas daily for any swelling, redness, or drainage.  If incisions were made in your legs, do the following:  Avoid crossing your legs.   Avoid sitting for long periods of time. Change positions every 30 minutes.   Elevate your legs when you are sitting.  Wear compression stockings as directed by your health care provider. These stockings help keep blood clots from forming in your legs.  Take showers once your health care provider approves. Until then, only take sponge baths. Pat incisions dry. Do not rub incisions with a washcloth or towel. Do not take baths, swim, or use a hot tub until your health care provider approves.  Eat foods that are high in fiber, such as raw fruits and vegetables, whole grains, beans, and nuts. Meats should be lean cut. Avoid canned, processed, and fried foods.  Drink enough fluid to keep your urine clear or pale yellow.  Weigh yourself every day. This helps identify if you are retaining fluid that may make your heart and lungs work harder.  Rest and limit activity as directed by your health care provider. You may be instructed to:  Stop any activity at once if you have chest pain, shortness of breath, irregular heartbeats, or dizziness. Get help right away if you have any of these symptoms.  Move around frequently for short periods or take short walks as directed by your health care provider. Increase your activities gradually. You may need physical therapy or cardiac rehabilitation to help strengthen your muscles and build your endurance.  Avoid lifting, pushing, or pulling anything heavier than 10 lb (4.5 kg) for at least 6 weeks after surgery.  Do not drive until your health care provider approves.  Ask your health care provider  when you may return to work.  Ask your health care provider when you may resume sexual activity.  Keep all follow-up visits as directed by your health care provider. This is important. SEEK MEDICAL CARE IF:  You have swelling, redness, increasing pain, or drainage at the site of an incision.  You have a fever.  You have swelling in your ankles or legs.  You have pain in your legs.   You gain 2 or more pounds (0.9 kg) a day.  You are nauseous or vomit.  You have diarrhea. SEEK IMMEDIATE MEDICAL CARE IF:  You have chest pain that goes to your jaw or arms.  You have shortness of breath.   You have a fast or irregular heartbeat.   You notice a "clicking" in your breastbone (sternum) when you move.   You have numbness or weakness in your arms or legs.  You feel dizzy or light-headed.  MAKE SURE YOU:  Understand these instructions.  Will watch your condition.  Will get help right away if you are not doing well or get worse.   This information is not intended to replace advice given to you by your health care provider. Make sure you discuss any questions you have with your health care provider.   Document Released: 12/26/2004 Document Revised: 06/29/2014 Document Reviewed: 11/15/2012 Elsevier Interactive Patient Education [employer].

## 2015-07-30 NOTE — Progress Notes (Addendum)
CARDIAC REHAB PHASE I   PRE:  Rate/Rhythm: 85 SR  BP:  Sitting: 108/68        SaO2: 96 RA  MODE:  Ambulation: 550 ft   POST:  Rate/Rhythm: 89 SR  BP:  Sitting: 120/69         SaO2: 96 RA  Pt ambulated 550 ft on RA, slow steady gait, hand held assist, tolerated well, no complaints. Pt should be safe to ambulate independently at this point. When asked about discharge plans, pt states he cannot leave today, as his wife if being discharged from the hospital today and he is uncertain whether his mother in law will be able to provide care/supervision for him at discharge. Case manager aware. Offered to review discharge education, pt states he would prefer to have family present. Discussed CRP2, gave pt brochure, pt agrees to referral, will send to [facility]. RN notified to page cardiac rehab before 1500 for education if pt to discharge today. Pt to recliner after walk, feet elevated, call bell within reach. Will follow.  [id number] Lenna Sciara, RN, BSN 07/30/2015 10:27 AM

## 2015-07-30 NOTE — Progress Notes (Signed)
[id number] Pt stated for home today. Education completed with pt who voiced understanding. Encouraged IS and flutter valve. Discussed smoking cessation and gave handout. Did not want fake cigarette. Has been 8 days without smoking pt stated. Discussed heart healthy diet. Referring to [facility] Phase 2. Put on discharge video for pt to view. Graylon Good RN BSN 07/30/2015 2:23 PM

## 2015-07-30 NOTE — Progress Notes (Addendum)
      [address]       [place] [address]             [phone]      5 Days Post-Op Procedure(s) (LRB): CORONARY ARTERY BYPASS GRAFTING X 3 UTILIZING BILATERAL IMA AND LEFT RADIAL ARTERY (N/A) RADIAL ARTERY HARVEST (Left) TRANSESOPHAGEAL ECHOCARDIOGRAM (TEE) (N/A)   Subjective:  [name] complains of some incisional soreness and shortness of breath.  He is ambulating without difficulty.  Objective: Vital signs in last 24 hours: Temp:  [97.8 F (36.6 C)-98.5 F (36.9 C)] 98.4 F (36.9 C) (02/07 0457) Pulse Rate:  [80-100] 80 (02/07 0457) Cardiac Rhythm:  [-] Normal sinus rhythm (02/06 1900) Resp:  [18-27] 18 (02/07 0457) BP: (103-131)/(57-79) 107/64 mmHg (02/07 0457) SpO2:  [97 %-100 %] 97 % (02/07 0457) Weight:  [162 lb 14.7 oz (73.9 kg)] 162 lb 14.7 oz (73.9 kg) (02/07 0457)  Intake/Output from previous day: 02/06 0701 - 02/07 0700 In: [id number] [P.O.:840; I.V.:3] Out: 500 [Urine:500]  General appearance: alert, cooperative and no distress Heart: regular rate and rhythm Lungs: clear to auscultation bilaterally Abdomen: soft, non-tender; bowel sounds normal; no masses,  no organomegaly Extremities: edema trace Wound: clean and dry  Lab Results:  Recent Labs  07/29/15 0352 07/30/15 0423  WBC 8.8 7.7  HGB 9.1* 8.8*  HCT 26.6* 27.0*  PLT 222 310   BMET:  Recent Labs  07/29/15 0352 07/30/15 0423  NA 137 135  K 3.7 4.3  CL 101 101  CO2 26 23  GLUCOSE 99 101*  BUN 14 13  CREATININE 0.79 0.87  CALCIUM 8.5* 8.8*    PT/INR: No results for input(s): LABPROT, INR in the last 72 hours. ABG    Component Value Date/Time   PHART 7.358 07/25/2015 1847   HCO3 20.5 07/25/2015 1847   TCO2 22 07/26/2015 1548   ACIDBASEDEF 4.0* 07/25/2015 1847   O2SAT 97.0 07/25/2015 1847   CBG (last 3)   Recent Labs  07/28/15 2043 07/28/15 2353 07/29/15 0404  GLUCAP 101* 120* 105*    Assessment/Plan: S/P Procedure(s) (LRB): CORONARY ARTERY BYPASS  GRAFTING X 3 UTILIZING BILATERAL IMA AND LEFT RADIAL ARTERY (N/A) RADIAL ARTERY HARVEST (Left) TRANSESOPHAGEAL ECHOCARDIOGRAM (TEE) (N/A)  1. CV- maintaining NSR- on Amiodarone and Lopressor 2. Pulm- no acute issues, continue IS 3. Renal- weight trending down, not currently on Lasix, creatinine WNL 4. Expected blood loss anemia- Hgb stable at 8.8 5. Dispo- patient stable, maintaining NSR, will d/c EPW, likely home in AM if remains stable   LOS: 7 days    Ahmed Prima, ERIN 07/30/2015  Patient seen and examined, agree with above Imdur was dropped in transfer orders- I have reordered it Dc EPW QTc= 0.41 Home later today or tomorrow  Remo Lipps C. Roxan Hockey, MD [facility] [phone]

## 2015-07-30 NOTE — Progress Notes (Signed)
Utilization review completed.  

## 2015-07-31 MED ORDER — ASPIRIN 325 MG PO TBEC: 325.0000 mg | DELAYED_RELEASE_TABLET | Freq: Every day | ORAL | Status: DC

## 2015-07-31 MED ORDER — TRAMADOL HCL 50 MG PO TABS: 50.0000 mg | ORAL_TABLET | ORAL | Status: DC | PRN

## 2015-07-31 MED ORDER — AMIODARONE HCL 400 MG PO TABS: 400.0000 mg | ORAL_TABLET | Freq: Two times a day (BID) | ORAL | Status: DC

## 2015-07-31 MED ORDER — GUAIFENESIN ER 600 MG PO TB12: 1200.0000 mg | ORAL_TABLET | Freq: Two times a day (BID) | ORAL | Status: DC | PRN

## 2015-07-31 MED ORDER — ISOSORBIDE MONONITRATE ER 30 MG PO TB24: 15.0000 mg | ORAL_TABLET | Freq: Every day | ORAL | Status: DC

## 2015-07-31 MED ORDER — OXYCODONE HCL 5 MG PO TABS: 5.0000 mg | ORAL_TABLET | ORAL | Status: DC | PRN

## 2015-07-31 MED ORDER — ATORVASTATIN CALCIUM 80 MG PO TABS: 80.0000 mg | ORAL_TABLET | Freq: Every day | ORAL | Status: DC

## 2015-07-31 MED ORDER — METOPROLOL TARTRATE 25 MG PO TABS: 25.0000 mg | ORAL_TABLET | Freq: Two times a day (BID) | ORAL | Status: DC

## 2015-07-31 NOTE — Progress Notes (Signed)
      [address]       [place] [address]             [phone]      6 Days Post-Op Procedure(s) (LRB): CORONARY ARTERY BYPASS GRAFTING X 3 UTILIZING BILATERAL IMA AND LEFT RADIAL ARTERY (N/A) RADIAL ARTERY HARVEST (Left) TRANSESOPHAGEAL ECHOCARDIOGRAM (TEE) (N/A)   Subjective:  No complaints.  Ready to go home. + ambulation  + BM  Objective: Vital signs in last 24 hours: Temp:  [98.1 F (36.7 C)-98.5 F (36.9 C)] 98.1 F (36.7 C) (02/08 0416) Pulse Rate:  [83-91] 84 (02/08 0416) Cardiac Rhythm:  [-] Normal sinus rhythm (02/07 1906) Resp:  [18-20] 18 (02/08 0416) BP: (102-120)/(60-73) 102/60 mmHg (02/08 0416) SpO2:  [96 %-99 %] 97 % (02/08 0416) Weight:  [161 lb 1.6 oz (73.074 kg)] 161 lb 1.6 oz (73.074 kg) (02/08 0416)  Intake/Output from previous day: 02/07 0701 - 02/08 0700 In: 723 [P.O.:720; I.V.:3] Out: 1350 [Urine:1350]  General appearance: alert, cooperative and no distress Heart: regular rate and rhythm Lungs: clear to auscultation bilaterally Abdomen: soft, non-tender; bowel sounds normal; no masses,  no organomegaly Extremities: edema trace Wound: clean and dry  Lab Results:  Recent Labs  07/29/15 0352 07/30/15 0423  WBC 8.8 7.7  HGB 9.1* 8.8*  HCT 26.6* 27.0*  PLT 222 310   BMET:  Recent Labs  07/29/15 0352 07/30/15 0423  NA 137 135  K 3.7 4.3  CL 101 101  CO2 26 23  GLUCOSE 99 101*  BUN 14 13  CREATININE 0.79 0.87  CALCIUM 8.5* 8.8*    PT/INR: No results for input(s): LABPROT, INR in the last 72 hours. ABG    Component Value Date/Time   PHART 7.358 07/25/2015 1847   HCO3 20.5 07/25/2015 1847   TCO2 22 07/26/2015 1548   ACIDBASEDEF 4.0* 07/25/2015 1847   O2SAT 97.0 07/25/2015 1847   CBG (last 3)   Recent Labs  07/28/15 2043 07/28/15 2353 07/29/15 0404  GLUCAP 101* 120* 105*    Assessment/Plan: S/P Procedure(s) (LRB): CORONARY ARTERY BYPASS GRAFTING X 3 UTILIZING BILATERAL IMA AND LEFT RADIAL  ARTERY (N/A) RADIAL ARTERY HARVEST (Left) TRANSESOPHAGEAL ECHOCARDIOGRAM (TEE) (N/A)  1.  CV- maintaining NSR- continue Amiodarone, Lopressor, Imdur for radial graft 2. Pulm- no acute issues, continue IS 3.  Renal- remains stable, no LE edema, not on lasix 4. Dispo- patient is stable, will d/c home today  LOS: 8 days    Benito Lemmerman 07/31/2015

## 2015-07-31 NOTE — Care Management Note (Signed)
Case Management Note Marvetta Gibbons RN, BSN Unit 2W-Case Manager [phone]  Patient Details  Name: Jeffrey Spence MRN: [id number] Date of Birth: 1960-06-25  Subjective/Objective:   Pt admitted with NSTEMI s/p  CABG                 Action/Plan: PTA pt lived at home with spouse- independent- per cardiac rehab pt walking 550 ft with no use of RW- spoke with pt at bedside- who states that wife is being discharged from [facility] 2/7- in-laws to assist with grocery shopping and cooking at first- pt working on finding out who will come transport home- plan to d/c in am 2/8  Expected Discharge Date:  07/31/15               Expected Discharge Plan:  Home/Self Care  In-House Referral:     Discharge planning Services  CM Consult  Post Acute Care Choice:    Choice offered to:     DME Arranged:    DME Agency:     HH Arranged:    [place] Agency:     Status of Service:  Completed, signed off  Medicare Important Message Given:    Date Medicare IM Given:    Medicare IM give by:    Date Additional Medicare IM Given:    Additional Medicare Important Message give by:     If discussed at [place] of Stay Meetings, dates discussed:  07/30/15  Additional Comments:  Dawayne Patricia, RN 07/31/2015, 12:24 PM

## 2015-07-31 NOTE — Progress Notes (Signed)
Pt discharge education and instructions completed with pt and family at bedside. Both voices understanding and denies any questions. Pt IV and telemetry removed; pt incisions remain approximated, clean, dry and intact; abd sutures removed per order. Pt handed his prescriptions for amiodarone, Lipitor, Imdur, Lopressor, tramadol and oxycodone. Pt discharge home with family to transport him home. Pt transported off unit via wheelchair with belongings and family at side. Delia Heady RN

## 2015-08-08 ENCOUNTER — Ambulatory Visit (INDEPENDENT_AMBULATORY_CARE_PROVIDER_SITE_OTHER): Payer: 59 | Admitting: Physician Assistant

## 2015-08-08 ENCOUNTER — Encounter: Payer: Self-pay | Admitting: Physician Assistant

## 2015-08-08 ENCOUNTER — Telehealth: Payer: Self-pay | Admitting: Physician Assistant

## 2015-08-08 VITALS — BP 100/62 | HR 56 | Ht 68.0 in | Wt 164.5 lb

## 2015-08-08 DIAGNOSIS — Z951 Presence of aortocoronary bypass graft: Secondary | ICD-10-CM | POA: Diagnosis not present

## 2015-08-08 DIAGNOSIS — I25110 Atherosclerotic heart disease of native coronary artery with unstable angina pectoris: Secondary | ICD-10-CM | POA: Diagnosis not present

## 2015-08-08 DIAGNOSIS — I480 Paroxysmal atrial fibrillation: Secondary | ICD-10-CM

## 2015-08-08 DIAGNOSIS — I2510 Atherosclerotic heart disease of native coronary artery without angina pectoris: Secondary | ICD-10-CM | POA: Diagnosis not present

## 2015-08-08 DIAGNOSIS — I214 Non-ST elevation (NSTEMI) myocardial infarction: Secondary | ICD-10-CM

## 2015-08-08 DIAGNOSIS — E785 Hyperlipidemia, unspecified: Secondary | ICD-10-CM

## 2015-08-08 DIAGNOSIS — Z720 Tobacco use: Secondary | ICD-10-CM

## 2015-08-08 MED ORDER — AMIODARONE HCL 200 MG PO TABS: 200.0000 mg | ORAL_TABLET | Freq: Every day | ORAL | Status: DC

## 2015-08-08 NOTE — Patient Instructions (Addendum)
Medication Instructions:  Please decrease amiodarone to 200 mg once daily  Labwork: CBC  Testing/Procedures: We are referring you to cardiac rehab They will contact you with an appointment  Follow-Up: 2 months w/ Dr. Rockey Situ  Date & time: ________________________________  If you need a refill on your cardiac medications before your next appointment, please call your pharmacy.  Cardiac Rehabilitation Cardiac rehabilitation is a medically supervised program that helps improve the health and well-being of people with heart problems. Cardiac rehabilitation includes exercise training, education, and counseling to help you get stronger and return to an active lifestyle. People who participate in cardiac rehabilitation programs get better faster and reduce future hospital stays. Cardiac rehabilitation programs can help when you have had the following conditions:  Heart attack.  Heart failure.  Peripheral artery disease.  Coronary artery disease.  Angina.  Lung or breathing problems. Cardiac rehabilitation programs are also used when you have the following procedures:  Coronary artery bypass graft surgery.  Heart valve replacement.  Heart stent placement.  Heart transplant.  Aneurysm repair. CARDIAC REHABILITATION MAY HELP YOU:  Reduce problems like chest pain and trouble breathing.  Change risk factors that contribute to heart disease, such as:  Smoking.  High blood pressure.  High cholesterol.  Diabetes.  Being out of shape or not active.  Weighing more than 30% over your ideal weight.  Diet.  Improve your mental outlook so you feel:  Less depressed or "blue."  More hopeful.  Better about yourself.  More confident about taking care of yourself.  Get support from health experts as well as other people with similar problems.  Learn how to manage and understand your medicines.  Teach your family about your condition and how to participate in your  recovery. WHAT HAPPENS IN CARDIAC REHABILITATION? You will be assessed by a cardiac rehabilitation team. They will check your health history and do a physical exam. You may need blood tests, stress tests, and other evaluations. You may not start a cardiac rehabilitation program if:  You develop angina with exercise or while at rest.  You have severe heart failure that limits your activity.  You have an abnormal heart rhythm at rest.  You develop heart rhythm problems during exercise.  You have high blood pressure that is not controlled. The cardiac rehabilitation team works with you to make a plan based on your health and goals. Everyone is unique, so each program is customized and your program may change as you progress. Members of a typical cardiac rehabilitation team may include such health professionals as:  Doctors.  Nurses.  Dietitians.  Psychologists.  Exercise specialists.  Physical and occupational therapists. A typical cardiac rehabilitation program is divided into phases. You advance from one phase to the next. Most cardiac rehabilitation sessions last for 60 minutes, 3 times a week.  Phase One starts while you are still in the hospital. You may start by walking in your room and then in the hall. You may start some simple exercises with a therapist. Health care team members will give you information and ask you many questions. You may not be able to remember details, so have a family member or an advocate with you to help keep track of information.  Phase Two begins when you go home or to another facility. This phase may last 8 to 12 weeks. You will travel to a cardiac rehabilitation center or a place where it is offered. Typically, you gradually increase your activity while being closely watched by  a nurse or therapist. Exercises may be a combination of strength or resistance training and "cardio" or aerobic movement on a treadmill or other machines. Your condition will  determine how often and how long these sessions will last.  In phase two, you may learn how to cook healthy meals, control your blood sugar, and manage your medicines. You may need help with scheduling or planning how and when to take your medicines. Use a timer, divided pill box, or follow a form to make taking your medicines easier. Use the method that works best for you. Some medicines should not be taken with certain foods. If you take more than one blood pressure medicine, you may need to stagger the times you take them. Taking all your blood pressure medicine at the same time may lower your blood pressure too much. If you have questions about your medicines, ask your health care provider questions until you understand.  Phase Three continues for the rest of your life. There will be less supervision. You may still participate in cardiac rehabilitation activities or become part of a group in your community. You may benefit from talking to other people about your experience if they are facing similar challenges. How soon you drive, have sex, or return to work will depend on your condition. These decisions should be made by you and your health care provider. If you need help, ask for it. Find out where you can get the help you need. Ask questions until you get answers and understand. SEEK IMMEDIATE MEDICAL CARE IF:  Get medical help at once if you experience any of the following symptoms:  Severe chest discomfort, especially if the pain is crushing or pressure-like and spreads to the arms, back, neck, or jaw. Do not wait to see if the pain will go away.  Weakness or numbness in your face, arms, or legs, especially on one side of the body; slurred speech; confusion; sudden severe headache or loss of vision (all symptoms of stroke).  You have shortness of breath.  You are sweating and feel sick to your stomach (nausea).  You feel dizzy or faint.  You experience profound tiredness (fatigue). Call  your local emergency service (911 in the [place] Do not drive yourself to the hospital.   This information is not intended to replace advice given to you by your health care provider. Make sure you discuss any questions you have with your health care provider.   Document Released: 03/17/2008 Document Revised: 06/29/2014 Document Reviewed: 09/12/2010 Elsevier Interactive Patient Education [employer].

## 2015-08-08 NOTE — Telephone Encounter (Signed)
Pt sched to see Ryan at 9:00.

## 2015-08-08 NOTE — Progress Notes (Signed)
Cardiology Office Note Date:  08/08/2015  Patient ID:  Jeffrey Spence, Jeffrey Spence May 18, 1961, MRN [id number] PCP:  Arnette Norris, MD  Cardiologist:  Dr. Rockey Situ, MD    Chief Complaint: Hospital follow up  History of Present Illness: Jeffrey Spence is a 55 y.o. male with history of recently diagnosed CAD s/p 3v CABG in the setting a NSTEMI 06/2015, HLD, tobacco abuse who presents for hospital follow up of the above cardiac bypass. He did not have any previously known cardiac history prior to the above admission. He presented to [facility] 07/23/2015 with stuttering CP since 1/26. Troponin was found to be 0.07-->1.49, ECG non-acute. He underwent cardiac cath that showed severe ostial left main disease estimated at 90% stenosis. Intracoronary nitroglycerin did not improve the stenosis. LAD with 90% stenosis, LCx with 80-90% steosis, RCA with 90% stenosis, LV gram with EF of 55-65%, no significant MR/AS. He was transferred to [facility] for CABG. He underwent successful 3 vessel CABG on 07/25/2015 with (LIMA-LAD, RIMA-dRCA, left radial artery-OM1). Post operatively he developed Afib and was placed on amiodarone, currently on 400 mg bid. He was placed on Imdur for the radial artery graft. He was discharged on full dose aspirin.   He has done quite well since his discharge. He is advancing his activity as tolerated, walking 14 minutes tid. No chest pain. Minimal SOB. No discharge from surgical wounds. No tachy-palpitations. Tolerating medications without issues. Weight has been stable. Appetite is returning. Still sleeping sitting up. No LEE.     Past Medical History  Diagnosis Date  . Arthritis   . CAD (coronary artery disease)     a. nstemi 1.2017; b. cardiac cath 06/2015 with LM and severe 3 veseel dz (full report pending)  . HLD (hyperlipidemia)   . Tobacco abuse   . NSTEMI (non-ST elevated myocardial infarction) ([place]) 06/2015    Past Surgical History  Procedure Laterality Date  . Fractured toe Right 2013    great  toe/with pin  . Cardiac catheterization Bilateral 07/23/2015    Procedure: Left Heart Cath and Coronary Angiography;  Surgeon: Minna Merritts, MD;  Location: [place] [facility];  Service: Cardiovascular;  Laterality: Bilateral;  . Coronary artery bypass graft N/A 07/25/2015    Procedure: CORONARY ARTERY BYPASS GRAFTING X 3 UTILIZING BILATERAL IMA AND LEFT RADIAL ARTERY;  Surgeon: Melrose Nakayama, MD;  Location: [place];  Service: Open Heart Surgery;  Laterality: N/A;  . Radial artery harvest Left 07/25/2015    Procedure: RADIAL ARTERY HARVEST;  Surgeon: Melrose Nakayama, MD;  Location: [place];  Service: Open Heart Surgery;  Laterality: Left;  . Tee without cardioversion N/A 07/25/2015    Procedure: TRANSESOPHAGEAL ECHOCARDIOGRAM (TEE);  Surgeon: Melrose Nakayama, MD;  Location: [place];  Service: Open Heart Surgery;  Laterality: N/A;    Current Outpatient Prescriptions  Medication Sig Dispense Refill  . amiodarone (PACERONE) 200 MG tablet Take 1 tablet (200 mg total) by mouth daily. 30 tablet 6  . aspirin EC 325 MG EC tablet Take 1 tablet (325 mg total) by mouth daily. 30 tablet 0  . atorvastatin (LIPITOR) 80 MG tablet Take 1 tablet (80 mg total) by mouth daily at 6 PM. 30 tablet 3  . isosorbide mononitrate (IMDUR) 30 MG 24 hr tablet Take 0.5 tablets (15 mg total) by mouth daily. 30 tablet 0  . metoprolol tartrate (LOPRESSOR) 25 MG tablet Take 1 tablet (25 mg total) by mouth 2 (two) times daily. 60 tablet 3  .  oxyCODONE (OXY IR/ROXICODONE) 5 MG immediate release tablet Take 1-2 tablets (5-10 mg total) by mouth every 3 (three) hours as needed for severe pain. 30 tablet 0  . traMADol (ULTRAM) 50 MG tablet Take 1-2 tablets (50-100 mg total) by mouth every 4 (four) hours as needed for moderate pain. 30 tablet 0   No current facility-administered medications for this visit.    Allergies:   Review of patient's allergies indicates no known allergies.   Social History:  The patient  reports  that he quit smoking about 3 weeks ago. His smoking use included Cigarettes. He has a 30 pack-year smoking history. He has never used smokeless tobacco. He reports that he drinks alcohol. He reports that he does not use illicit drugs.   Family History:  The patient's family history includes Arthritis in his mother; Cancer in his father and paternal uncle; Heart attack (age of onset: 75) in his father. There is no history of Colon cancer.  ROS:   Review of Systems  Constitutional: Negative for fever, chills, weight loss, malaise/fatigue and diaphoresis.  HENT: Negative for congestion.   Eyes: Negative for discharge and redness.  Respiratory: Positive for shortness of breath. Negative for cough, hemoptysis, sputum production and wheezing.   Cardiovascular: Negative for chest pain, palpitations, orthopnea, claudication, leg swelling and PND.  Gastrointestinal: Negative for heartburn, nausea, vomiting and abdominal pain.  Musculoskeletal: Negative for myalgias and falls.  Skin: Negative for rash.  Neurological: Positive for tingling. Negative for dizziness, tremors, sensory change, speech change, focal weakness and weakness.       At surgical site along right right radial artery  Endo/Heme/Allergies: Does not bruise/bleed easily.  Psychiatric/Behavioral: The patient is not nervous/anxious.   All other systems reviewed and are negative.     PHYSICAL EXAM:  VS:  BP 100/62 mmHg  Pulse 56  Ht 5\' 8"  (1.727 m)  Wt 164 lb 8 oz (74.617 kg)  BMI 25.02 kg/m2 BMI: Body mass index is 25.02 kg/(m^2). Well nourished, well developed, in no acute distress HEENT: normocephalic, atraumatic Neck: no JVD, carotid bruits or masses Cardiac:  normal S1, S2; RRR; no murmurs, rubs, or gallops, well healed surgical scars Lungs:  clear to auscultation bilaterally, no wheezing, rhonchi or rales Abd: soft, nontender, no hepatomegaly, + BS MS: no deformity or atrophy Ext: no edema Skin: warm and dry, no  rash Neuro:  moves all extremities spontaneously, no focal abnormalities noted, follows commands Psych: euthymic mood, full affect   EKG:  Was ordered today. Shows sinus bradycardia, 56 bpm, nonspecific anterolateral st/t changes   Recent Labs: 07/23/2015: B Natriuretic Peptide 76.0 07/27/2015: ALT 15*; TSH 2.373 07/28/2015: Magnesium 2.1 07/30/2015: BUN 13; Creatinine, Ser 0.87; Hemoglobin 8.8*; Platelets 310; Potassium 4.3; Sodium 135  07/19/2015: Cholesterol 223*; HDL 42.90; LDL Cholesterol 155*; Total CHOL/HDL Ratio 5; Triglycerides 127.0; VLDL 25.4   Estimated Creatinine Clearance: 93.9 mL/min (by C-G formula based on Cr of 0.87).   Wt Readings from Last 3 Encounters:  08/08/15 164 lb 8 oz (74.617 kg)  07/31/15 161 lb 1.6 oz (73.074 kg)  07/23/15 168 lb (76.204 kg)     Other studies reviewed: Additional studies/records reviewed today include: summarized above  ASSESSMENT AND PLAN:  1. CAD with history of NSTEMI s/p CABG as above: Doing quite well. No further symptoms concerning for chest pain. Continue aspirin 325 mg daily for now per TCTS. Would decrease to 81 mg daily and add Plavix 75 mg daily once ok per TCTS per the CURE  Trial, either on 3/7 at TCTS visit or in follow up with Dr. Rockey Situ in April. Continue Lopressor 25 mg bid and Imdur 30 mg daily. Paperwork submitted for cardiac rehab. Start once ok per TCTS. No driving until 4 weeks out from bypass per TCTS.   2. Post-operative Afib: He self decreased amiodarone from 400 mg bid to 400 mg daily. Taper down to 200 mg daily at this time. Suspect he can discontinue this at his [place] appointment, if not then, then perhaps at his next follow up with cardiology with Dr. Rockey Situ in April 2017. No further documented episodes of Afib. Continue Lopressor 25 mg bid. No indication for long term, full-dose anticoagulation at this time given this was in the post operative state. CHADS2VASc of 1 (vascular disease). Risks of long term, full-dose  anticoagulation out-weigh the benefits.   3. HLD: Lipitor 80 mg daily.   4. Tobacco abuse: Has quit as of 07/17/2015.   Disposition: F/u with Dr. Rockey Situ in 2 months  Current medicines are reviewed at length with the patient today.  The patient did not have any concerns regarding medicines.  Melvern Banker PA-C 08/08/2015 10:38 AM     [facility] [address] [place] [address]  [place], [place] [address] [phone]

## 2015-08-08 NOTE — Telephone Encounter (Signed)
Patient in office today and says he left fmla & disability forms to be completed for pick up at today's visit.

## 2015-08-08 NOTE — Telephone Encounter (Signed)
Forms sent to [employer] via fax attention nicole. 08-08-15 1620 [facility]

## 2015-08-09 ENCOUNTER — Telehealth: Payer: Self-pay | Admitting: Family Medicine

## 2015-08-09 ENCOUNTER — Other Ambulatory Visit (INDEPENDENT_AMBULATORY_CARE_PROVIDER_SITE_OTHER): Payer: 59

## 2015-08-09 DIAGNOSIS — R899 Unspecified abnormal finding in specimens from other organs, systems and tissues: Secondary | ICD-10-CM

## 2015-08-09 LAB — CBC WITH DIFFERENTIAL/PLATELET
Basophils Absolute: 0 10*3/uL (ref 0.0–0.2)
Basophils Absolute: 0 10*3/uL (ref 0.0–0.1)
Basophils Relative: 0.5 % (ref 0.0–3.0)
Basos: 1 %
EOS (ABSOLUTE): 0.1 10*3/uL (ref 0.0–0.4)
Eos: 2 %
Eosinophils Absolute: 0.1 10*3/uL (ref 0.0–0.7)
Eosinophils Relative: 2 % (ref 0.0–5.0)
HCT: 31.8 % — ABNORMAL LOW (ref 39.0–52.0)
Hematocrit: 33.1 % — ABNORMAL LOW (ref 37.5–51.0)
Hemoglobin: 10.7 g/dL — ABNORMAL LOW (ref 12.6–17.7)
Hemoglobin: 10.5 g/dL — ABNORMAL LOW (ref 13.0–17.0)
Immature Grans (Abs): 0 10*3/uL (ref 0.0–0.1)
Immature Granulocytes: 0 %
Lymphocytes Absolute: 1.7 10*3/uL (ref 0.7–3.1)
Lymphocytes Relative: 28.3 % (ref 12.0–46.0)
Lymphs Abs: 1.9 10*3/uL (ref 0.7–4.0)
Lymphs: 24 %
MCH: 30.6 pg (ref 26.6–33.0)
MCHC: 32.3 g/dL (ref 31.5–35.7)
MCHC: 33 g/dL (ref 30.0–36.0)
MCV: 95 fL (ref 79–97)
MCV: 92.1 fl (ref 78.0–100.0)
Monocytes Absolute: 0.7 10*3/uL (ref 0.1–0.9)
Monocytes Absolute: 0.6 10*3/uL (ref 0.1–1.0)
Monocytes Relative: 8.5 % (ref 3.0–12.0)
Monocytes: 9 %
Neutro Abs: 4.1 10*3/uL (ref 1.4–7.7)
Neutrophils Absolute: 4.7 10*3/uL (ref 1.4–7.0)
Neutrophils Relative %: 60.7 % (ref 43.0–77.0)
Neutrophils: 64 %
Platelets: 851 10*3/uL (ref 150–379)
Platelets: 691 10*3/uL — ABNORMAL HIGH (ref 150.0–400.0)
RBC: 3.5 x10E6/uL — ABNORMAL LOW (ref 4.14–5.80)
RBC: 3.45 Mil/uL — ABNORMAL LOW (ref 4.22–5.81)
RDW: 14.5 % (ref 12.3–15.4)
RDW: 14.8 % (ref 11.5–15.5)
WBC: 7.3 10*3/uL (ref 3.4–10.8)
WBC: 6.8 10*3/uL (ref 4.0–10.5)

## 2015-08-09 NOTE — Telephone Encounter (Signed)
Please see result note.  Cardiology sent CBC that showed elevated platelets.  I would like for him to come in ASAP to have CBC repeated.

## 2015-08-09 NOTE — Telephone Encounter (Signed)
Spoke to pt and lab appt scheduled for 2/17 @1545 

## 2015-08-09 NOTE — Telephone Encounter (Signed)
This pt has not seen Dr Deborra Medina since 01/2013

## 2015-08-09 NOTE — Telephone Encounter (Signed)
Pt requesting cb regarding lab work that is being faxed over from cardiologist.   Please call [phone] Thanks

## 2015-08-09 NOTE — Telephone Encounter (Signed)
Pt would like a call as soon as his lab results come in

## 2015-08-09 NOTE — Telephone Encounter (Signed)
Great. Thank you.

## 2015-08-10 ENCOUNTER — Other Ambulatory Visit: Payer: Self-pay | Admitting: Family Medicine

## 2015-08-10 DIAGNOSIS — D75839 Thrombocytosis, unspecified: Secondary | ICD-10-CM

## 2015-08-10 DIAGNOSIS — D473 Essential (hemorrhagic) thrombocythemia: Secondary | ICD-10-CM

## 2015-08-11 ENCOUNTER — Encounter: Payer: Self-pay | Admitting: Emergency Medicine

## 2015-08-11 ENCOUNTER — Emergency Department
Admission: EM | Admit: 2015-08-11 | Discharge: 2015-08-11 | Disposition: A | Discharge status: Home / Self Care | Payer: 59 | Attending: Emergency Medicine | Admitting: Emergency Medicine

## 2015-08-11 ENCOUNTER — Emergency Department: Payer: 59

## 2015-08-11 DIAGNOSIS — R079 Chest pain, unspecified: Secondary | ICD-10-CM | POA: Insufficient documentation

## 2015-08-11 DIAGNOSIS — Z79899 Other long term (current) drug therapy: Secondary | ICD-10-CM | POA: Insufficient documentation

## 2015-08-11 DIAGNOSIS — I252 Old myocardial infarction: Secondary | ICD-10-CM | POA: Diagnosis not present

## 2015-08-11 DIAGNOSIS — R509 Fever, unspecified: Secondary | ICD-10-CM | POA: Diagnosis present

## 2015-08-11 DIAGNOSIS — Z87891 Personal history of nicotine dependence: Secondary | ICD-10-CM | POA: Diagnosis not present

## 2015-08-11 DIAGNOSIS — Z951 Presence of aortocoronary bypass graft: Secondary | ICD-10-CM | POA: Diagnosis not present

## 2015-08-11 DIAGNOSIS — Z7982 Long term (current) use of aspirin: Secondary | ICD-10-CM | POA: Insufficient documentation

## 2015-08-11 DIAGNOSIS — I25110 Atherosclerotic heart disease of native coronary artery with unstable angina pectoris: Secondary | ICD-10-CM | POA: Diagnosis not present

## 2015-08-11 DIAGNOSIS — G8918 Other acute postprocedural pain: Secondary | ICD-10-CM | POA: Insufficient documentation

## 2015-08-11 LAB — COMPREHENSIVE METABOLIC PANEL
ALT: 35 U/L (ref 17–63)
AST: 21 U/L (ref 15–41)
Albumin: 3.8 g/dL (ref 3.5–5.0)
Alkaline Phosphatase: 234 U/L — ABNORMAL HIGH (ref 38–126)
Anion gap: 9 (ref 5–15)
BUN: 14 mg/dL (ref 6–20)
CO2: 24 mmol/L (ref 22–32)
Calcium: 9.1 mg/dL (ref 8.9–10.3)
Chloride: 105 mmol/L (ref 101–111)
Creatinine, Ser: 1.03 mg/dL (ref 0.61–1.24)
GFR calc Af Amer: >60 mL/min (ref >60)
GFR calc non Af Amer: >60 mL/min (ref >60)
Glucose, Bld: 95 mg/dL (ref 65–99)
Potassium: 4 mmol/L (ref 3.5–5.1)
Sodium: 138 mmol/L (ref 135–145)
Total Bilirubin: 0.8 mg/dL (ref 0.3–1.2)
Total Protein: 7.4 g/dL (ref 6.5–8.1)

## 2015-08-11 LAB — URINALYSIS COMPLETE WITH MICROSCOPIC (ARMC ONLY)
Bacteria, UA: NONE SEEN
Bilirubin Urine: NEGATIVE
Glucose, UA: NEGATIVE mg/dL
Hgb urine dipstick: NEGATIVE
Ketones, ur: NEGATIVE mg/dL
Leukocytes, UA: NEGATIVE
Nitrite: NEGATIVE
Protein, ur: NEGATIVE mg/dL
RBC / HPF: NONE SEEN RBC/hpf (ref 0–5)
Specific Gravity, Urine: 1.005 (ref 1.005–1.030)
Squamous Epithelial / LPF: NONE SEEN
WBC, UA: NONE SEEN WBC/hpf (ref 0–5)
pH: 5 (ref 5.0–8.0)

## 2015-08-11 LAB — LACTIC ACID, PLASMA
Lactic Acid, Venous: 1 mmol/L (ref 0.5–2.0)
Lactic Acid, Venous: 1 mmol/L (ref 0.5–2.0)

## 2015-08-11 LAB — CBC WITH DIFFERENTIAL/PLATELET
Basophils Absolute: 0 10*3/uL (ref 0–0.1)
Basophils Relative: 1 %
Eosinophils Absolute: 0.1 10*3/uL (ref 0–0.7)
Eosinophils Relative: 1 %
HCT: 34.6 % — ABNORMAL LOW (ref 40.0–52.0)
Hemoglobin: 11.5 g/dL — ABNORMAL LOW (ref 13.0–18.0)
Lymphocytes Relative: 8 %
Lymphs Abs: 0.5 10*3/uL — ABNORMAL LOW (ref 1.0–3.6)
MCH: 31.1 pg (ref 26.0–34.0)
MCHC: 33.3 g/dL (ref 32.0–36.0)
MCV: 93.4 fL (ref 80.0–100.0)
Monocytes Absolute: 0.4 10*3/uL (ref 0.2–1.0)
Monocytes Relative: 6 %
Neutro Abs: 6 10*3/uL (ref 1.4–6.5)
Neutrophils Relative %: 84 %
Platelets: 545 10*3/uL — ABNORMAL HIGH (ref 150–440)
RBC: 3.7 MIL/uL — ABNORMAL LOW (ref 4.40–5.90)
RDW: 14.6 % — ABNORMAL HIGH (ref 11.5–14.5)
WBC: 7.1 10*3/uL (ref 3.8–10.6)

## 2015-08-11 LAB — RAPID INFLUENZA A&B ANTIGENS
Influenza A (ARMC): NEGATIVE
Influenza B (ARMC): NEGATIVE

## 2015-08-11 LAB — TROPONIN I: Troponin I: <0.03 ng/mL (ref <0.031)

## 2015-08-11 MED ORDER — SODIUM CHLORIDE 0.9 % IV BOLUS (SEPSIS)
1000.0000 mL | Freq: Once | INTRAVENOUS | Status: AC
  Administered 2015-08-11: 1000 mL via INTRAVENOUS

## 2015-08-11 MED ORDER — ACETAMINOPHEN 500 MG PO TABS
ORAL_TABLET | ORAL | Status: AC
  Administered 2015-08-11: 1000 mg via ORAL
  Filled 2015-08-11: qty 2

## 2015-08-11 MED ORDER — ACETAMINOPHEN 500 MG PO TABS
1000.0000 mg | ORAL_TABLET | Freq: Once | ORAL | Status: AC
  Administered 2015-08-11: 1000 mg via ORAL

## 2015-08-11 NOTE — ED Provider Notes (Signed)
[facility] Emergency Department Provider Note  ____________________________________________  Time seen: Approximately 1:18 PM  I have reviewed the triage vital signs and the nursing notes.   HISTORY  Chief Complaint Fever and Chills    HPI Jeffrey Spence is a 55 y.o. male patient reports he had a CABG on February 2. Patient's been having some chest pain in the area of the incision but this is gradually improved on a daily basis. Today patient had a fever 100.6 at home and chills in the morning. Patient came to the ER after calling his doctor. Emergency room patient had blood pressures in the 0000000 systolic. He reports at home has been running 120s. Patient denies any other pain or any other instances a fever has no sore throat and earache cough abdominal pain or anything else.  Past Medical History  Diagnosis Date  . Arthritis   . CAD (coronary artery disease)     a. nstemi 1.2017; b. cardiac cath 06/2015 with LM and severe 3 veseel dz (full report pending)  . HLD (hyperlipidemia)   . Tobacco abuse   . NSTEMI (non-ST elevated myocardial infarction) ([place]) 06/2015    Patient Active Problem List   Diagnosis Date Noted  . Coronary artery disease involving native coronary artery of native heart with unstable angina pectoris ([place])   . S/P CABG x 3 07/25/2015  . NSTEMI (non-ST elevated myocardial infarction) ([place])   . Acute coronary syndrome ([place])   . Unstable angina pectoris ([place])   . Smoker   . Coronary artery disease involving left main coronary artery   . Chest pain 07/22/2015  . Swelling of arm 01/20/2013  . Routine general medical examination at a health care facility 01/12/2013  . OA (osteoarthritis) of knee 01/12/2013  . TESTICULAR HYPOFUNCTION 08/14/2010  . TOBACCO ABUSE 08/12/2010    Past Surgical History  Procedure Laterality Date  . Fractured toe Right 2013    great toe/with pin  . Cardiac catheterization Bilateral 07/23/2015    Procedure:  Left Heart Cath and Coronary Angiography;  Surgeon: Minna Merritts, MD;  Location: [place] [facility];  Service: Cardiovascular;  Laterality: Bilateral;  . Coronary artery bypass graft N/A 07/25/2015    Procedure: CORONARY ARTERY BYPASS GRAFTING X 3 UTILIZING BILATERAL IMA AND LEFT RADIAL ARTERY;  Surgeon: Melrose Nakayama, MD;  Location: [place];  Service: Open Heart Surgery;  Laterality: N/A;  . Radial artery harvest Left 07/25/2015    Procedure: RADIAL ARTERY HARVEST;  Surgeon: Melrose Nakayama, MD;  Location: [place];  Service: Open Heart Surgery;  Laterality: Left;  . Tee without cardioversion N/A 07/25/2015    Procedure: TRANSESOPHAGEAL ECHOCARDIOGRAM (TEE);  Surgeon: Melrose Nakayama, MD;  Location: [place];  Service: Open Heart Surgery;  Laterality: N/A;    Current Outpatient Rx  Name  Route  Sig  Dispense  Refill  . amiodarone (PACERONE) 200 MG tablet   Oral   Take 1 tablet (200 mg total) by mouth daily.   30 tablet   6   . aspirin EC 325 MG EC tablet   Oral   Take 1 tablet (325 mg total) by mouth daily.   30 tablet   0   . atorvastatin (LIPITOR) 80 MG tablet   Oral   Take 1 tablet (80 mg total) by mouth daily at 6 PM.   30 tablet   3   . isosorbide mononitrate (IMDUR) 30 MG 24 hr tablet   Oral  Take 0.5 tablets (15 mg total) by mouth daily.   30 tablet   0   . metoprolol tartrate (LOPRESSOR) 25 MG tablet   Oral   Take 1 tablet (25 mg total) by mouth 2 (two) times daily.   60 tablet   3   . oxyCODONE (OXY IR/ROXICODONE) 5 MG immediate release tablet   Oral   Take 1-2 tablets (5-10 mg total) by mouth every 3 (three) hours as needed for severe pain.   30 tablet   0   . traMADol (ULTRAM) 50 MG tablet   Oral   Take 1-2 tablets (50-100 mg total) by mouth every 4 (four) hours as needed for moderate pain.   30 tablet   0     Allergies Review of patient's allergies indicates no known allergies.  Family History  Problem Relation Age of Onset  .  Arthritis Mother     ra  . Heart attack Father 36  . Cancer Father   . Cancer Paternal Uncle     leukemia  . Colon cancer Neg Hx     Social History Social History  Substance Use Topics  . Smoking status: Former Smoker -- 1.00 packs/day for 30 years    Types: Cigarettes    Quit date: 07/17/2015  . Smokeless tobacco: Never Used  . Alcohol Use: 0.0 oz/week    0 Standard drinks or equivalent per week     Comment: rare    Review of Systems Constitutional:  fever/chills Eyes: No visual changes. ENT: No sore throat. Cardiovascular: See history of present illness Respiratory: Denies shortness of breath. Gastrointestinal: No abdominal pain.  No nausea, no vomiting.  No diarrhea.  No constipation. Genitourinary: Negative for dysuria. Musculoskeletal: Negative for back pain. Skin: Negative for rash. Neurological: Negative for headaches, focal weakness or numbness.  10-point ROS otherwise negative.  ____________________________________________   PHYSICAL EXAM:  VITAL SIGNS: ED Triage Vitals  Enc Vitals Group     BP 08/11/15 1257 93/58 mmHg     Pulse Rate 08/11/15 1257 73     Resp 08/11/15 1257 18     Temp 08/11/15 1257 98.6 F (37 C)     Temp Source 08/11/15 1257 Oral     SpO2 08/11/15 1257 96 %     Weight 08/11/15 1257 164 lb (74.39 kg)     Height 08/11/15 1257 5\' 8"  (1.727 m)     Head Cir --      Peak Flow --      Pain Score --      Pain Loc --      Pain Edu? --      Excl. in [place]? --    Constitutional: Alert and oriented. Well appearing and in no acute distress. Eyes: Conjunctivae are normal. PERRL. EOMI. Head: Atraumatic. Nose: No congestion/rhinnorhea. Mouth/Throat: Mucous membranes are moist.  Oropharynx non-erythematous. Neck: No stridor.  Cardiovascular: Normal rate, regular rhythm. Grossly normal heart sounds.  Good peripheral circulation. Respiratory: Normal respiratory effort.  No retractions. Lungs CTAB. Chest wall incision looks like it's healing  nicely there is no drainage is not tender or red Gastrointestinal: Soft and nontender. No distention. No abdominal bruits. No CVA tenderness. Musculoskeletal: No lower extremity tenderness nor edema.  No joint effusions. Neurologic:  Normal speech and language. No gross focal neurologic deficits are appreciated. No gait instability. Skin:  Skin is warm, dry and intact. No rash noted. Psychiatric: Mood and affect are normal. Speech and behavior are normal.  ____________________________________________  LABS (all labs ordered are listed, but only abnormal results are displayed)  Labs Reviewed  COMPREHENSIVE METABOLIC PANEL - Abnormal; Notable for the following:    Alkaline Phosphatase 234 (*)    All other components within normal limits  CBC WITH DIFFERENTIAL/PLATELET - Abnormal; Notable for the following:    RBC 3.70 (*)    Hemoglobin 11.5 (*)    HCT 34.6 (*)    RDW 14.6 (*)    Platelets 545 (*)    Lymphs Abs 0.5 (*)    All other components within normal limits  URINALYSIS COMPLETEWITH MICROSCOPIC ([facility] ONLY) - Abnormal; Notable for the following:    Color, Urine STRAW (*)    APPearance CLEAR (*)    All other components within normal limits  RAPID INFLUENZA A&B ANTIGENS ([facility] ONLY)  URINE CULTURE  CULTURE, BLOOD (ROUTINE X 2)  CULTURE, BLOOD (ROUTINE X 2)  TROPONIN I  LACTIC ACID, PLASMA  LACTIC ACID, PLASMA   ____________________________________________  EKG  EKG read and interpreted by me shows sinus rhythm rate of 66 normal axis nonspecific ST-T wave changes. ____________________________________________  RADIOLOGY  Chest x-ray report read as bilateral basilar haziness unchanged from prior film I reviewed the film and discussed with the check the radiologist ____________________________________________   PROCEDURES   ____________________________________________   INITIAL IMPRESSION / ASSESSMENT AND PLAN / ED COURSE  Pertinent labs & imaging results that  were available during my care of the patient were reviewed by me and considered in my medical decision making (see chart for details).  Discussed with Dr. Michelene Heady [facility] at [facility] who feels in view of the labs and chest x-ray report we can let the patient go home he will follow-up tomorrow ____________________________________________   FINAL CLINICAL IMPRESSION(S) / ED DIAGNOSES  Final diagnoses:  Fever, unspecified fever cause      Nena Polio, MD 08/11/15 1949

## 2015-08-11 NOTE — ED Notes (Signed)
Report received 

## 2015-08-11 NOTE — Discharge Instructions (Signed)
Fever, Adult A fever is an increase in the body's temperature. It is often defined as a temperature of 100 F (38C) or higher. Short mild or moderate fevers often have no long-term effects. They also often do not need treatment. Moderate or high fevers may make you feel uncomfortable. Sometimes, they can also be a sign of a serious illness or disease. The sweating that may happen with repeated fevers or fevers that last a while may also cause you to not have enough fluid in your body (dehydration). You can take your temperature with a thermometer to see if you have a fever. A measured temperature can change with:  Age.  Time of day.  Where the thermometer is placed:  Mouth (oral).  Rectum (rectal).  Ear (tympanic).  Underarm (axillary).  Forehead (temporal). HOME CARE Pay attention to any changes in your symptoms. Take these actions to help with your condition:  Take over-the-counter and prescription medicines only as told by your doctor. Follow the dosing instructions carefully.  If you were prescribed an antibiotic medicine, take it as told by your doctor. Do not stop taking the antibiotic even if you start to feel better.  Rest as needed.  Drink enough fluid to keep your pee (urine) clear or pale yellow.  Sponge yourself or bathe with room-temperature water as needed. This helps to lower your body temperature . Do not use ice water.  Do not wear too many blankets or heavy clothes. GET HELP IF:  You throw up (vomit).  You cannot eat or drink without throwing up.  You have watery poop (diarrhea).  It hurts when you pee.  Your symptoms do not get better with treatment.  You have new symptoms.  You feel very weak. GET HELP RIGHT AWAY IF:  You are short of breath or have trouble breathing.  You are dizzy or you pass out (faint).  You feel confused.  You have signs of not having enough fluid in your body, such as:  A dry mouth.  Peeing less.  Looking  pale.  You have very bad pain in your belly (abdomen).  You keep throwing up or having water poop.  You have a skin rash.  Your symptoms suddenly get worse.   This information is not intended to replace advice given to you by your health care provider. Make sure you discuss any questions you have with your health care provider.   Document Released: 03/17/2008 Document Revised: 02/27/2015 Document Reviewed: 08/02/2014 Elsevier Interactive Patient Education 2016 [employer].  Use Tylenol or Advil for fever. Please return for high fever lightheadedness feeling sicker or shortness of breath or any problems like that. The cardiothoracic surgeons at [facility] will call you up tomorrow morning with a follow-up appointment tomorrow afternoon they will check you over thoroughly than.

## 2015-08-11 NOTE — ED Notes (Signed)
Pt presents to ED with c/o fever and chills at this time. Pt states he had a triple bypass on Jul 25, 2015. States he woke up this morning and took his temp that was 100.1. Pt states he took 600mg  Ibuprofen at home. Pt denies pain at this time. Pt also c/o cough and green mucous coming from his nose.

## 2015-08-12 ENCOUNTER — Telehealth: Payer: Self-pay

## 2015-08-12 ENCOUNTER — Ambulatory Visit (INDEPENDENT_AMBULATORY_CARE_PROVIDER_SITE_OTHER): Payer: Self-pay | Admitting: Physician Assistant

## 2015-08-12 VITALS — BP 100/60 | HR 63 | Temp 96.0°F | Resp 16 | Ht 68.0 in | Wt 167.0 lb

## 2015-08-12 DIAGNOSIS — Z951 Presence of aortocoronary bypass graft: Secondary | ICD-10-CM

## 2015-08-12 NOTE — Telephone Encounter (Signed)
Glad he is feeling better.  Thanks for the update.

## 2015-08-12 NOTE — Progress Notes (Signed)
  HPI:  Patient presents for evaluation after being evaluated in the ED over the weekend.  The patient states that on Sunday he developed fever and chills.  He states he called his PCP who recommended he be evaluated in the ED.  Workup was unremarkable with UA being negative, WBC was 7.3, Blood cultures were obtained and were negative to date and CXR showed atelectasis.  The patient denies all other symptoms of cough, congestion, chest pain, shortness of breath, nausea, vomiting, diarrhea, or lower extremity edema.  He denies evidence of wound infection  Current Outpatient Prescriptions  Medication Sig Dispense Refill  . amiodarone (PACERONE) 200 MG tablet Take 1 tablet (200 mg total) by mouth daily. 30 tablet 6  . aspirin EC 325 MG EC tablet Take 1 tablet (325 mg total) by mouth daily. 30 tablet 0  . atorvastatin (LIPITOR) 80 MG tablet Take 1 tablet (80 mg total) by mouth daily at 6 PM. 30 tablet 3  . isosorbide mononitrate (IMDUR) 30 MG 24 hr tablet Take 0.5 tablets (15 mg total) by mouth daily. 30 tablet 0  . metoprolol tartrate (LOPRESSOR) 25 MG tablet Take 1 tablet (25 mg total) by mouth 2 (two) times daily. 60 tablet 3  . oxyCODONE (OXY IR/ROXICODONE) 5 MG immediate release tablet Take 1-2 tablets (5-10 mg total) by mouth every 3 (three) hours as needed for severe pain. 30 tablet 0  . traMADol (ULTRAM) 50 MG tablet Take 1-2 tablets (50-100 mg total) by mouth every 4 (four) hours as needed for moderate pain. 30 tablet 0   No current facility-administered medications for this visit.    Physical Exam:  BP 100/60 mmHg  Pulse 63  Temp(Src) 96 F (35.6 C) (Oral)  Resp 16  Ht 5\' 8"  (1.727 m)  Wt 167 lb (75.751 kg)  BMI 25.40 kg/m2  SpO2 96%  Gen: no apparent distress Heart: RRR Lungs: CTA bilaterally Abd: soft non-tender, non-distended Extremity: no edema Wounds: healing well, no drainage, no erythema  Diagnostic Tests:  CXR: no significant pleural effusions, some mild bilateral  atelectasis, mild lung opacification  A/P:  1. Patient presented with low grade temp of 100.4 to [facility] over the weekend.  Workup was negative.  Patient is afebrile today.  He has no acute evidence of infection due to pneumonia, bronchitis, or wound infection.  He was instructed to continue his IS and call us should further issues arise.  He will still follow up with Dr. Roxan Hockey on 08/27/2015   Ellwood Handler, PA-C [facility] [phone]

## 2015-08-12 NOTE — Telephone Encounter (Signed)
Pt left v/m; pt was supposed to have repeat labs on 08/12/15 at [facility] but pt had labs done at [facility] ED on 08/11/15. Pt wants to know if still needs to have labs done at [facility], pt request cb.

## 2015-08-12 NOTE — Telephone Encounter (Signed)
Labs reviewed.  Platelets are trending down which is great. Let's repeat CBC on Friday instead of today.  How is he feeling?

## 2015-08-12 NOTE — Telephone Encounter (Signed)
Per chart review tab pt was seen [facility] ED on 08/11/2015.

## 2015-08-12 NOTE — Telephone Encounter (Signed)
Patient notified as instructed by telephone and verbalized understanding. Lab appointment scheduled. Patient stated that he is doing much better today.

## 2015-08-12 NOTE — Telephone Encounter (Signed)
PLEASE NOTE: All timestamps contained within this report are represented as [place] Standard Time. CONFIDENTIALTY NOTICE: This fax transmission is intended only for the addressee. It contains information that is legally privileged, confidential or otherwise protected from use or disclosure. If you are not the intended recipient, you are strictly prohibited from reviewing, disclosing, copying using or disseminating any of this information or taking any action in reliance on or regarding this information. If you have received this fax in error, please notify us immediately by telephone so that we can arrange for its return to [place]. Phone: [phone], Toll-Free: [phone], Fax: [phone] Page: 1 of 2 Call Id: [id number] [place] Patient Name: Jeffrey Spence Gender: Male DOB: 15-Mar-1961 Age: 55 Y 4 M 11 D Return Phone Number: [phone] (Primary) Address: City/State/Zip: [place] [address] Client [facility] Night - Client Client Site [place] Physician Deborra Medina, [place] Type Call Call Type Triage / Clinical Relationship To Patient Self Return Phone Number [phone] (Primary) Chief Complaint Flu Symptom Initial Comment caller states that he has chills and a fever 100.1 @1025 , cough; hx of triple bypass on 07/25/2015 PreDisposition Go to ED Translation No Nurse Assessment Nurse: Vevelyn Royals, RN, Verdis Frederickson Date/Time (Eastern Time): 08/11/2015 10:55:00 AM Confirm and document reason for call. If symptomatic, describe symptoms. You must click the next button to save text entered. ---Caller states that he has chills and a fever 100.1 @1025 , cough; hx of triple bypass on 07/25/2015. Felt good until last night. Has the patient traveled out of the country within the last 30 days? ---No Does the patient have any new or worsening symptoms?  ---Yes Will a triage be completed? ---Yes Related visit to physician within the last 2 weeks? ---No Does the PT have any chronic conditions? (i.e. diabetes, asthma, etc.) ---Yes List chronic conditions. ---hx of triple bypass on 07/25/2015 High platelet count Thursday / Friday; will be rechecked Monday Is this a behavioral health or substance abuse call? ---No Guidelines Guideline Title Affirmed Question Affirmed Notes Nurse Date/Time Eilene Ghazi Time) Breathing Difficulty [1] MODERATE difficulty breathing (e.g., speaks in phrases, SOB even at rest, pulse 100-120) AND [2] NEWonset or WORSE than normal Vevelyn Royals, RN, Verdis Frederickson 08/11/2015 11:09:29 AM Disp. Time Eilene Ghazi Time) Disposition Final User 08/11/2015 11:14:34 AM Go to ED Now Yes Vevelyn Royals, RN, Feliz Beam NOTE: All timestamps contained within this report are represented as [place] Standard Time. CONFIDENTIALTY NOTICE: This fax transmission is intended only for the addressee. It contains information that is legally privileged, confidential or otherwise protected from use or disclosure. If you are not the intended recipient, you are strictly prohibited from reviewing, disclosing, copying using or disseminating any of this information or taking any action in reliance on or regarding this information. If you have received this fax in error, please notify us immediately by telephone so that we can arrange for its return to [place]. Phone: [phone], Toll-Free: [phone], Fax: [phone] Page: 2 of 2 Call Id: [id number] Caller Understands: Yes Disagree/Comply: Comply Care Advice Given Per Guideline GO TO ED NOW: You need to be seen in the Emergency Department. Go to the ER at ___________ [place] now. Drive carefully. NOTE TO TRIAGER - DRIVING: * Another adult should drive. * If immediate transportation is not available via car or taxi, then the patient should be instructed to call [phone]. BRING MEDICINES: * Please bring a list of your  current medicines  when you go to the Emergency Department (ER). * It is also a good idea to bring the pill bottles too. This will help the doctor to make certain you are taking the right medicines and the right dose. CALL EMS 911 IF: you become worse. CARE ADVICE given per Breathing Difficulty (Adult) guideline. Referrals [facility] - ED

## 2015-08-13 LAB — URINE CULTURE
Culture: NO GROWTH
Special Requests: NORMAL

## 2015-08-15 ENCOUNTER — Encounter: Payer: 59 | Attending: Cardiovascular Disease | Admitting: *Deleted

## 2015-08-15 VITALS — BP 128/60 | HR 59 | Ht 68.4 in | Wt 164.4 lb

## 2015-08-15 DIAGNOSIS — Z951 Presence of aortocoronary bypass graft: Secondary | ICD-10-CM | POA: Insufficient documentation

## 2015-08-15 NOTE — Progress Notes (Signed)
Cardiac Individual Treatment Plan  Patient Details  Name: Jeffrey Spence MRN: [id number] Date of Birth: [birth year] Referring Provider:  Skeet Latch, MD  Initial Encounter Date: Date: 08/15/15  Visit Diagnosis: No diagnosis found.  Patient's Home Medications on Admission:  Current outpatient prescriptions:  .  amiodarone (PACERONE) 200 MG tablet, Take 1 tablet (200 mg total) by mouth daily., Disp: 30 tablet, Rfl: 6 .  aspirin EC 325 MG EC tablet, Take 1 tablet (325 mg total) by mouth daily., Disp: 30 tablet, Rfl: 0 .  atorvastatin (LIPITOR) 80 MG tablet, Take 1 tablet (80 mg total) by mouth daily at 6 PM., Disp: 30 tablet, Rfl: 3 .  isosorbide mononitrate (IMDUR) 30 MG 24 hr tablet, Take 0.5 tablets (15 mg total) by mouth daily., Disp: 30 tablet, Rfl: 0 .  metoprolol tartrate (LOPRESSOR) 25 MG tablet, Take 1 tablet (25 mg total) by mouth 2 (two) times daily., Disp: 60 tablet, Rfl: 3 .  oxyCODONE (OXY IR/ROXICODONE) 5 MG immediate release tablet, Take 1-2 tablets (5-10 mg total) by mouth every 3 (three) hours as needed for severe pain. (Patient not taking: Reported on 08/15/2015), Disp: 30 tablet, Rfl: 0 .  traMADol (ULTRAM) 50 MG tablet, Take 1-2 tablets (50-100 mg total) by mouth every 4 (four) hours as needed for moderate pain. (Patient not taking: Reported on 08/15/2015), Disp: 30 tablet, Rfl: 0  Past Medical History: Past Medical History  Diagnosis Date  . Arthritis   . CAD (coronary artery disease)     a. nstemi 1.2017; b. cardiac cath 06/2015 with LM and severe 3 veseel dz (full report pending)  . HLD (hyperlipidemia)   . Tobacco abuse   . NSTEMI (non-ST elevated myocardial infarction) ([place]) 06/2015    Tobacco Use: History  Smoking status  . Former Smoker -- 1.00 packs/day for 30 years  . Types: Cigarettes  . Quit date: 07/17/2015  Smokeless tobacco  . Never Used    Labs: Recent Review Flowsheet Data    Labs for ITP Cardiac and Pulmonary Rehab Latest Ref Rng  07/25/2015 07/25/2015 07/25/2015 07/25/2015 07/26/2015   PHART 7.350 - 7.450 7.341(L) 7.333(L) 7.358 - -   PCO2ART 35.0 - 45.0 mmHg 39.6 33.9(L) 36.7 - -   HCO3 20.0 - 24.0 mEq/L 21.6 17.9(L) 20.5 - -   TCO2 0 - 100 mmol/L 23 19 22 22 22    ACIDBASEDEF 0.0 - 2.0 mmol/L 4.0(H) 7.0(H) 4.0(H) - -   O2SAT - 96.0 97.0 97.0 - -       Exercise Target Goals: Date: 08/15/15  Exercise Program Goal: Individual exercise prescription set with THRR, safety & activity barriers. Participant demonstrates ability to understand and report RPE using BORG scale, to self-measure pulse accurately, and to acknowledge the importance of the exercise prescription.  Exercise Prescription Goal: Starting with aerobic activity 30 plus minutes a day, 3 days per week for initial exercise prescription. Provide home exercise prescription and guidelines that participant acknowledges understanding prior to discharge.  Activity Barriers & Risk Stratification:     Activity Barriers & Cardiac Risk Stratification - 08/15/15 1258    Activity Barriers & Cardiac Risk Stratification   Activity Barriers Other (comment)  "sternum from CABG "WEight restirction of 10 lbs   Cardiac Risk Stratification High      6 Minute Walk:     6 Minute Walk      08/15/15 1409       6 Minute Walk   Phase Initial     Distance 1525 feet  Walk Time 6 minutes     # of Rest Breaks 0     Symptoms No     Resting HR 61 bpm     Resting BP 128/60 mmHg     Max Ex. HR 90 bpm     Max Ex. BP 128/64 mmHg        Initial Exercise Prescription:     Initial Exercise Prescription - 08/15/15 1400    Date of Initial Exercise Prescription   Date 08/15/15   Treadmill   MPH 2.5   Grade 0   Minutes 10   Recumbant Bike   Level 2   RPM 40   Watts 20   Minutes 10   NuStep   Level 2   Watts 40   Minutes 10   Arm Ergometer   Level 1   Watts 10   Minutes 10   Recumbant Elliptical   Level 2   RPM 40   Watts 20   Minutes 10   REL-XR   Level 2    Watts 40   Minutes 10   T5 Nustep   Level 1   Watts 15   Minutes 10   Biostep-RELP   Level 2   Watts 40   Minutes 10   Prescription Details   Frequency (times per week) 3   Duration Progress to 30 minutes of continuous aerobic without signs/symptoms of physical distress   Intensity   THRR REST +  30   Ratings of Perceived Exertion 11-15   Perceived Dyspnea 2-4   Progression Continue progressive overload as per policy without signs/symptoms or physical distress.   Resistance Training   Training Prescription Yes   Weight 1   Reps 10-15      Exercise Prescription Changes:   Discharge Exercise Prescription (Final Exercise Prescription Changes):   Nutrition:  Target Goals: Understanding of nutrition guidelines, daily intake of sodium 1500mg , cholesterol 200mg , calories 30% from fat and 7% or less from saturated fats, daily to have 5 or more servings of fruits and vegetables.  Biometrics:     Pre Biometrics - 08/15/15 1411    Pre Biometrics   Height 5' 8.4" (1.737 m)   Weight 164 lb 6.4 oz (74.571 kg)   Waist Circumference 38.5 inches   Hip Circumference 39 inches   Waist to Hip Ratio 0.99 %   BMI (Calculated) 24.8       Nutrition Therapy Plan and Nutrition Goals:     Nutrition Therapy & Goals - 08/15/15 1415    Nutrition Therapy   Drug/Food Interactions Statins/Certain Fruits   Intervention Plan   Expected Outcomes Short Term Goal: Understand basic principles of dietary content, such as calories, fat, sodium, cholesterol and nutrients.;Long Term Goal: Adherence to prescribed nutrition plan.      Nutrition Discharge: Rate Your Plate Scores:   Nutrition Goals Re-Evaluation:   Psychosocial: Target Goals: Acknowledge presence or absence of depression, maximize coping skills, provide positive support system. Participant is able to verbalize types and ability to use techniques and skills needed for reducing stress and depression.  Initial Review &  Psychosocial Screening:     Initial Psych Review & Screening - 08/15/15 1418    Initial Review   Current issues with Current Sleep Concerns   Family Dynamics   Good Support System? Yes   Barriers   Psychosocial barriers to participate in program The patient should benefit from training in stress management and relaxation.   Screening Interventions   Interventions  Encouraged to exercise      Quality of Life Scores:   PHQ-9:     Recent Review Flowsheet Data    Depression screen [facility] 2/9 08/15/2015   Decreased Interest 0   Down, Depressed, Hopeless 1   PHQ - 2 Score 1   Altered sleeping 3   Tired, decreased energy 1   Change in appetite 1   Feeling bad or failure about yourself  0   Trouble concentrating 0   Moving slowly or fidgety/restless 0   Suicidal thoughts 0   PHQ-9 Score 6   Difficult doing work/chores Somewhat difficult      Psychosocial Evaluation and Intervention:   Psychosocial Re-Evaluation:   Vocational Rehabilitation: Provide vocational rehab assistance to qualifying candidates.   Vocational Rehab Evaluation & Intervention:     Vocational Rehab - 08/15/15 1300    Initial Vocational Rehab Evaluation & Intervention   Assessment shows need for Vocational Rehabilitation No      Education: Education Goals: Education classes will be provided on a weekly basis, covering required topics. Participant will state understanding/return demonstration of topics presented.  Learning Barriers/Preferences:     Learning Barriers/Preferences - 08/15/15 1412    Learning Barriers/Preferences   Learning Barriers None   Learning Preferences None      Education Topics: General Nutrition Guidelines/Fats and Fiber: -Group instruction provided by verbal, written material, models and posters to present the general guidelines for heart healthy nutrition. Gives an explanation and review of dietary fats and fiber.   Controlling Sodium/Reading Food Labels: -Group  verbal and written material supporting the discussion of sodium use in heart healthy nutrition. Review and explanation with models, verbal and written materials for utilization of the food label.   Exercise Physiology & Risk Factors: - Group verbal and written instruction with models to review the exercise physiology of the cardiovascular system and associated critical values. Details cardiovascular disease risk factors and the goals associated with each risk factor.   Aerobic Exercise & Resistance Training: - Gives group verbal and written discussion on the health impact of inactivity. On the components of aerobic and resistive training programs and the benefits of this training and how to safely progress through these programs.   Flexibility, Balance, General Exercise Guidelines: - Provides group verbal and written instruction on the benefits of flexibility and balance training programs. Provides general exercise guidelines with specific guidelines to those with heart or lung disease. Demonstration and skill practice provided.   Stress Management: - Provides group verbal and written instruction about the health risks of elevated stress, cause of high stress, and healthy ways to reduce stress.   Depression: - Provides group verbal and written instruction on the correlation between heart/lung disease and depressed mood, treatment options, and the stigmas associated with seeking treatment.   Anatomy & Physiology of the Heart: - Group verbal and written instruction and models provide basic cardiac anatomy and physiology, with the coronary electrical and arterial systems. Review of: AMI, Angina, Valve disease, Heart Failure, Cardiac Arrhythmia, Pacemakers, and the ICD.   Cardiac Procedures: - Group verbal and written instruction and models to describe the testing methods done to diagnose heart disease. Reviews the outcomes of the test results. Describes the treatment choices: Medical  Management, Angioplasty, or Coronary Bypass Surgery.   Cardiac Medications: - Group verbal and written instruction to review commonly prescribed medications for heart disease. Reviews the medication, class of the drug, and side effects. Includes the steps to properly store meds and maintain the  prescription regimen.   Go Sex-Intimacy & Heart Disease, Get SMART - Goal Setting: - Group verbal and written instruction through game format to discuss heart disease and the return to sexual intimacy. Provides group verbal and written material to discuss and apply goal setting through the application of the S.M.A.R.T. Method.   Other Matters of the Heart: - Provides group verbal, written materials and models to describe Heart Failure, Angina, Valve Disease, and Diabetes in the realm of heart disease. Includes description of the disease process and treatment options available to the cardiac patient.   Exercise & Equipment Safety: - Individual verbal instruction and demonstration of equipment use and safety with use of the equipment.          Cardiac Rehab from 08/15/2015 in [facility] Cardiac and Pulmonary Rehab   Date  08/15/15   Educator  C. EnterkinRN   Instruction Review Code  1- partially meets, needs review/practice      Infection Prevention: - Provides verbal and written material to individual with discussion of infection control including proper hand washing and proper equipment cleaning during exercise session.      Cardiac Rehab from 08/15/2015 in [facility] Cardiac and Pulmonary Rehab   Date  08/15/15   Educator  C. EnterkinRN   Instruction Review Code  2- meets goals/outcomes      Falls Prevention: - Provides verbal and written material to individual with discussion of falls prevention and safety.      Cardiac Rehab from 08/15/2015 in [facility] Cardiac and Pulmonary Rehab   Date  08/15/15   Educator  C. Perline Awe   Instruction Review Code  2- meets goals/outcomes      Diabetes: - Individual  verbal and written instruction to review signs/symptoms of diabetes, desired ranges of glucose level fasting, after meals and with exercise. Advice that pre and post exercise glucose checks will be done for 3 sessions at entry of program.    Knowledge Questionnaire Score:     Knowledge Questionnaire Score - 08/15/15 1413    Knowledge Questionnaire Score   Pre Score 23      Personal Goals and Risk Factors at Admission:     Personal Goals and Risk Factors at Admission - 08/15/15 1417    Core Components/Risk Factors/Patient Goals on Admission   Tobacco Cessation Yes   Expected Outcomes Long Term: Complete abstinence from all tobacco products for at least 12 months from quit date.  Rajesh quit smoking his one pack per day for 34 years on Jul 22, 2015. Brenen said his triggers are driving which he isn't able to drive until August 22, 2015.    Lipids Yes   Intervention Provide education and support for participant on nutrition & aerobic/resistive exercise along with prescribed medications to achieve LDL 70mg , HDL >40mg .   Expected Outcomes Short Term: Participant states understanding of desired cholesterol values and is compliant with medications prescribed. Participant is following exercise prescription and nutrition guidelines.;Long Term: Cholesterol controlled with medications as prescribed, with individualized exercise RX and with personalized nutrition plan. Value goals: LDL < 70mg , HDL > 40 mg.      Personal Goals and Risk Factors Review:    Personal Goals Discharge (Final Personal Goals and Risk Factors Review):    ITP Comments:     ITP Comments      08/15/15 1414           ITP Comments Shoua said he feels his sternum pain sometimes especially when he coughs or sneezes but just sitting  in a chair he has no pain. Kavian is able to drive March 2, [id number], Gleason quit smoking July 22, 2015.           Comments: When I asked Joahan how it was going quitting smoking he said ok. I said  where did you use to smoke and he reported driving. Trevel said he is not suppose to drive until August 22, 2015 so will cont to follow up since Dariush reported smoking one pack of cigarettes for 34 years.

## 2015-08-15 NOTE — Progress Notes (Signed)
Daily Session Note  Patient Details  Name: Jeffrey Spence MRN: [id number] Date of Birth: 07/24/1960 Referring Provider:  Skeet Latch, MD  Encounter Date: 08/15/2015  Check In:     Session Check In - 08/15/15 1300    Check-In   Location [facility]-Cardiac & Pulmonary Rehab   Staff Present Gerlene Burdock, RN, BSN;Steven Way, BS, ACSM EP-C, Exercise Physiologist   Supervising physician immediately available to respond to emergencies See telemetry face sheet for immediately available ER MD   Medication changes reported     No   Fall or balance concerns reported    No   Warm-up and Cool-down Not performed (comment)   VAD Patient? No   Pain Assessment   Currently in Pain? Yes   Pain Score 1    Pain Location Sternum   Pain Orientation Mid   Pain Descriptors / Indicators Dull   Pain Type Surgical pain   Pain Onset 1 to 4 weeks ago  July 25, 2015 CABG   Pain Frequency Occasional   Aggravating Factors  Coughing-holds chest or sneeze         Goals Met:  Personal goals reviewed  Goals Unmet:  Not Applicable  Goals Comments: Jeffrey Spence is ready to start in Cardiac Rehab. His first full exercise session will be August 23, 2015. Jeffrey Spence reports he is not suppose to drive until August 22, 2015.    Dr. Emily Filbert is Medical Director for [facility] and [facility].

## 2015-08-15 NOTE — Addendum Note (Signed)
Addended by: Gerlene Burdock on: 08/15/2015 02:40 PM   Modules accepted: Orders

## 2015-08-15 NOTE — Patient Instructions (Signed)
Patient Instructions  Patient Details  Name: Jeffrey Spence MRN: [id number] Date of Birth: 03-07-1961 Referring Provider:  Skeet Latch, MD  Below are the personal goals you chose as well as exercise and nutrition goals. Our goal is to help you keep on track towards obtaining and maintaining your goals. We will be discussing your progress on these goals with you throughout the program.  Initial Exercise Prescription:     Initial Exercise Prescription - 08/15/15 1400    Date of Initial Exercise Prescription   Date 08/15/15   Treadmill   MPH 2.5   Grade 0   Minutes 10   Recumbant Bike   Level 2   RPM 40   Watts 20   Minutes 10   NuStep   Level 2   Watts 40   Minutes 10   Arm Ergometer   Level 1   Watts 10   Minutes 10   Recumbant Elliptical   Level 2   RPM 40   Watts 20   Minutes 10   REL-XR   Level 2   Watts 40   Minutes 10   T5 Nustep   Level 1   Watts 15   Minutes 10   Biostep-RELP   Level 2   Watts 40   Minutes 10   Prescription Details   Frequency (times per week) 3   Duration Progress to 30 minutes of continuous aerobic without signs/symptoms of physical distress   Intensity   THRR REST +  30   Ratings of Perceived Exertion 11-15   Perceived Dyspnea 2-4   Progression Continue progressive overload as per policy without signs/symptoms or physical distress.   Resistance Training   Training Prescription Yes   Weight 1   Reps 10-15      Exercise Goals: Frequency: Be able to perform aerobic exercise three times per week working toward 3-5 days per week.  Intensity: Work with a perceived exertion of 11 (fairly light) - 15 (hard) as tolerated. Follow your new exercise prescription and watch for changes in prescription as you progress with the program. Changes will be reviewed with you when they are made.  Duration: You should be able to do 30 minutes of continuous aerobic exercise in addition to a 5 minute warm-up and a 5 minute cool-down  routine.  Nutrition Goals: Your personal nutrition goals will be established when you do your nutrition analysis with the dietician.  The following are nutrition guidelines to follow: Cholesterol < 200mg /day Sodium < 1500mg /day Fiber: Men over 50 yrs - 30 grams per day  Personal Goals:     Personal Goals and Risk Factors at Admission - 08/15/15 1417    Core Components/Risk Factors/Patient Goals on Admission   Tobacco Cessation Yes   Expected Outcomes Long Term: Complete abstinence from all tobacco products for at least 12 months from quit date.  Jeffrey Spence quit smoking his one pack per day for 34 years on Jul 22, 2015. Jeffrey Spence said his triggers are driving which he isn't able to drive until August 22, 2015.    Lipids Yes   Intervention Provide education and support for participant on nutrition & aerobic/resistive exercise along with prescribed medications to achieve LDL 70mg , HDL >40mg .   Expected Outcomes Short Term: Participant states understanding of desired cholesterol values and is compliant with medications prescribed. Participant is following exercise prescription and nutrition guidelines.;Long Term: Cholesterol controlled with medications as prescribed, with individualized exercise RX and with personalized nutrition plan. Value goals: LDL <  70mg , HDL > 40 mg.      Tobacco Use Initial Evaluation: History  Smoking status  . Former Smoker -- 1.00 packs/day for 30 years  . Types: Cigarettes  . Quit date: 07/17/2015  Smokeless tobacco  . Never Used    Copy of goals given to participant.

## 2015-08-16 ENCOUNTER — Other Ambulatory Visit (INDEPENDENT_AMBULATORY_CARE_PROVIDER_SITE_OTHER): Payer: 59

## 2015-08-16 DIAGNOSIS — D473 Essential (hemorrhagic) thrombocythemia: Secondary | ICD-10-CM | POA: Diagnosis not present

## 2015-08-16 DIAGNOSIS — D75839 Thrombocytosis, unspecified: Secondary | ICD-10-CM

## 2015-08-16 LAB — CBC WITH DIFFERENTIAL/PLATELET
Basophils Absolute: 0 10*3/uL (ref 0.0–0.1)
Basophils Relative: 0.3 % (ref 0.0–3.0)
Eosinophils Absolute: 0.1 10*3/uL (ref 0.0–0.7)
Eosinophils Relative: 2.6 % (ref 0.0–5.0)
HCT: 34.8 % — ABNORMAL LOW (ref 39.0–52.0)
Hemoglobin: 11.6 g/dL — ABNORMAL LOW (ref 13.0–17.0)
Lymphocytes Relative: 40 % (ref 12.0–46.0)
Lymphs Abs: 1.7 10*3/uL (ref 0.7–4.0)
MCHC: 33.3 g/dL (ref 30.0–36.0)
MCV: 92.2 fl (ref 78.0–100.0)
Monocytes Absolute: 0.4 10*3/uL (ref 0.1–1.0)
Monocytes Relative: 10.4 % (ref 3.0–12.0)
Neutro Abs: 1.9 10*3/uL (ref 1.4–7.7)
Neutrophils Relative %: 46.7 % (ref 43.0–77.0)
Platelets: 333 10*3/uL (ref 150.0–400.0)
RBC: 3.77 Mil/uL — ABNORMAL LOW (ref 4.22–5.81)
RDW: 15 % (ref 11.5–15.5)
WBC: 4.2 10*3/uL (ref 4.0–10.5)

## 2015-08-16 LAB — CULTURE, BLOOD (ROUTINE X 2)
Culture: NO GROWTH
Culture: NO GROWTH

## 2015-08-21 NOTE — Addendum Note (Signed)
Addended by: Lynford Humphrey on: 08/21/2015 07:18 AM   Modules accepted: Orders

## 2015-08-21 NOTE — Progress Notes (Signed)
Cardiac Individual Treatment Plan  Patient Details  Name: Jeffrey Spence MRN: [id number] Date of Birth: 1961-01-18 Referring Provider:  Skeet Latch, MD  Initial Encounter Date: Date: 08/15/15  Visit Diagnosis: S/P CABG x 3 - Plan: CARDIAC REHAB 30 DAY REVIEW  Patient's Home Medications on Admission:  Current outpatient prescriptions:  .  amiodarone (PACERONE) 200 MG tablet, Take 1 tablet (200 mg total) by mouth daily., Disp: 30 tablet, Rfl: 6 .  aspirin EC 325 MG EC tablet, Take 1 tablet (325 mg total) by mouth daily., Disp: 30 tablet, Rfl: 0 .  atorvastatin (LIPITOR) 80 MG tablet, Take 1 tablet (80 mg total) by mouth daily at 6 PM., Disp: 30 tablet, Rfl: 3 .  isosorbide mononitrate (IMDUR) 30 MG 24 hr tablet, Take 0.5 tablets (15 mg total) by mouth daily., Disp: 30 tablet, Rfl: 0 .  metoprolol tartrate (LOPRESSOR) 25 MG tablet, Take 1 tablet (25 mg total) by mouth 2 (two) times daily., Disp: 60 tablet, Rfl: 3 .  oxyCODONE (OXY IR/ROXICODONE) 5 MG immediate release tablet, Take 1-2 tablets (5-10 mg total) by mouth every 3 (three) hours as needed for severe pain. (Patient not taking: Reported on 08/15/2015), Disp: 30 tablet, Rfl: 0 .  traMADol (ULTRAM) 50 MG tablet, Take 1-2 tablets (50-100 mg total) by mouth every 4 (four) hours as needed for moderate pain. (Patient not taking: Reported on 08/15/2015), Disp: 30 tablet, Rfl: 0  Past Medical History: Past Medical History  Diagnosis Date  . Arthritis   . CAD (coronary artery disease)     a. nstemi 1.2017; b. cardiac cath 06/2015 with LM and severe 3 veseel dz (full report pending)  . HLD (hyperlipidemia)   . Tobacco abuse   . NSTEMI (non-ST elevated myocardial infarction) ([place]) 06/2015    Tobacco Use: History  Smoking status  . Former Smoker -- 1.00 packs/day for 30 years  . Types: Cigarettes  . Quit date: 07/17/2015  Smokeless tobacco  . Never Used    Labs: Recent Review Flowsheet Data    Labs for ITP Cardiac and Pulmonary  Rehab Latest Ref Rng 07/25/2015 07/25/2015 07/25/2015 07/25/2015 07/26/2015   PHART 7.350 - 7.450 7.341(L) 7.333(L) 7.358 - -   PCO2ART 35.0 - 45.0 mmHg 39.6 33.9(L) 36.7 - -   HCO3 20.0 - 24.0 mEq/L 21.6 17.9(L) 20.5 - -   TCO2 0 - 100 mmol/L 23 19 22 22 22    ACIDBASEDEF 0.0 - 2.0 mmol/L 4.0(H) 7.0(H) 4.0(H) - -   O2SAT - 96.0 97.0 97.0 - -       Exercise Target Goals: Date: 08/15/15  Exercise Program Goal: Individual exercise prescription set with THRR, safety & activity barriers. Participant demonstrates ability to understand and report RPE using BORG scale, to self-measure pulse accurately, and to acknowledge the importance of the exercise prescription.  Exercise Prescription Goal: Starting with aerobic activity 30 plus minutes a day, 3 days per week for initial exercise prescription. Provide home exercise prescription and guidelines that participant acknowledges understanding prior to discharge.  Activity Barriers & Risk Stratification:     Activity Barriers & Cardiac Risk Stratification - 08/15/15 1258    Activity Barriers & Cardiac Risk Stratification   Activity Barriers Other (comment)  "sternum from CABG "WEight restirction of 10 lbs   Cardiac Risk Stratification High      6 Minute Walk:     6 Minute Walk      08/15/15 1409       6 Minute Walk   Phase  Initial     Distance 1525 feet     Walk Time 6 minutes     # of Rest Breaks 0     Symptoms No     Resting HR 61 bpm     Resting BP 128/60 mmHg     Max Ex. HR 90 bpm     Max Ex. BP 128/64 mmHg        Initial Exercise Prescription:     Initial Exercise Prescription - 08/15/15 1400    Date of Initial Exercise Prescription   Date 08/15/15   Treadmill   MPH 2.5   Grade 0   Minutes 10   Recumbant Bike   Level 2   RPM 40   Watts 20   Minutes 10   NuStep   Level 2   Watts 40   Minutes 10   Arm Ergometer   Level 1   Watts 10   Minutes 10   Recumbant Elliptical   Level 2   RPM 40   Watts 20   Minutes 10    REL-XR   Level 2   Watts 40   Minutes 10   T5 Nustep   Level 1   Watts 15   Minutes 10   Biostep-RELP   Level 2   Watts 40   Minutes 10   Prescription Details   Frequency (times per week) 3   Duration Progress to 30 minutes of continuous aerobic without signs/symptoms of physical distress   Intensity   THRR REST +  30   Ratings of Perceived Exertion 11-15   Perceived Dyspnea 2-4   Progression   Progression Continue progressive overload as per policy without signs/symptoms or physical distress.   Resistance Training   Training Prescription Yes   Weight 1   Reps 10-15      Exercise Prescription Changes:   Discharge Exercise Prescription (Final Exercise Prescription Changes):   Nutrition:  Target Goals: Understanding of nutrition guidelines, daily intake of sodium 1500mg , cholesterol 200mg , calories 30% from fat and 7% or less from saturated fats, daily to have 5 or more servings of fruits and vegetables.  Biometrics:     Pre Biometrics - 08/15/15 1411    Pre Biometrics   Height 5' 8.4" (1.737 m)   Weight 164 lb 6.4 oz (74.571 kg)   Waist Circumference 38.5 inches   Hip Circumference 39 inches   Waist to Hip Ratio 0.99 %   BMI (Calculated) 24.8       Nutrition Therapy Plan and Nutrition Goals:     Nutrition Therapy & Goals - 08/15/15 1415    Nutrition Therapy   Drug/Food Interactions Statins/Certain Fruits   Intervention Plan   Expected Outcomes Short Term Goal: Understand basic principles of dietary content, such as calories, fat, sodium, cholesterol and nutrients.;Long Term Goal: Adherence to prescribed nutrition plan.      Nutrition Discharge: Rate Your Plate Scores:   Nutrition Goals Re-Evaluation:   Psychosocial: Target Goals: Acknowledge presence or absence of depression, maximize coping skills, provide positive support system. Participant is able to verbalize types and ability to use techniques and skills needed for reducing stress and  depression.  Initial Review & Psychosocial Screening:     Initial Psych Review & Screening - 08/15/15 1418    Initial Review   Current issues with Current Sleep Concerns   Family Dynamics   Good Support System? Yes   Barriers   Psychosocial barriers to participate in program The patient should  benefit from training in stress management and relaxation.   Screening Interventions   Interventions Encouraged to exercise      Quality of Life Scores:     Quality of Life - 08/15/15 1432    Quality of Life Scores   Health/Function Pre 13.71 %   Socioeconomic Pre 15.59 %   Psych/Spiritual Pre 15.71 %   Family Pre 19.2 %   GLOBAL Pre 15.3 %      PHQ-9:     Recent Review Flowsheet Data    Depression screen [facility] 2/9 08/15/2015   Decreased Interest 0   Down, Depressed, Hopeless 1   PHQ - 2 Score 1   Altered sleeping 3   Tired, decreased energy 1   Change in appetite 1   Feeling bad or failure about yourself  0   Trouble concentrating 0   Moving slowly or fidgety/restless 0   Suicidal thoughts 0   PHQ-9 Score 6   Difficult doing work/chores Somewhat difficult      Psychosocial Evaluation and Intervention:   Psychosocial Re-Evaluation:   Vocational Rehabilitation: Provide vocational rehab assistance to qualifying candidates.   Vocational Rehab Evaluation & Intervention:     Vocational Rehab - 08/15/15 1300    Initial Vocational Rehab Evaluation & Intervention   Assessment shows need for Vocational Rehabilitation No      Education: Education Goals: Education classes will be provided on a weekly basis, covering required topics. Participant will state understanding/return demonstration of topics presented.  Learning Barriers/Preferences:     Learning Barriers/Preferences - 08/15/15 1412    Learning Barriers/Preferences   Learning Barriers None   Learning Preferences None      Education Topics: General Nutrition Guidelines/Fats and Fiber: -Group  instruction provided by verbal, written material, models and posters to present the general guidelines for heart healthy nutrition. Gives an explanation and review of dietary fats and fiber.   Controlling Sodium/Reading Food Labels: -Group verbal and written material supporting the discussion of sodium use in heart healthy nutrition. Review and explanation with models, verbal and written materials for utilization of the food label.   Exercise Physiology & Risk Factors: - Group verbal and written instruction with models to review the exercise physiology of the cardiovascular system and associated critical values. Details cardiovascular disease risk factors and the goals associated with each risk factor.   Aerobic Exercise & Resistance Training: - Gives group verbal and written discussion on the health impact of inactivity. On the components of aerobic and resistive training programs and the benefits of this training and how to safely progress through these programs.   Flexibility, Balance, General Exercise Guidelines: - Provides group verbal and written instruction on the benefits of flexibility and balance training programs. Provides general exercise guidelines with specific guidelines to those with heart or lung disease. Demonstration and skill practice provided.   Stress Management: - Provides group verbal and written instruction about the health risks of elevated stress, cause of high stress, and healthy ways to reduce stress.   Depression: - Provides group verbal and written instruction on the correlation between heart/lung disease and depressed mood, treatment options, and the stigmas associated with seeking treatment.   Anatomy & Physiology of the Heart: - Group verbal and written instruction and models provide basic cardiac anatomy and physiology, with the coronary electrical and arterial systems. Review of: AMI, Angina, Valve disease, Heart Failure, Cardiac Arrhythmia, Pacemakers,  and the ICD.   Cardiac Procedures: - Group verbal and written instruction and models to  describe the testing methods done to diagnose heart disease. Reviews the outcomes of the test results. Describes the treatment choices: Medical Management, Angioplasty, or Coronary Bypass Surgery.   Cardiac Medications: - Group verbal and written instruction to review commonly prescribed medications for heart disease. Reviews the medication, class of the drug, and side effects. Includes the steps to properly store meds and maintain the prescription regimen.   Go Sex-Intimacy & Heart Disease, Get SMART - Goal Setting: - Group verbal and written instruction through game format to discuss heart disease and the return to sexual intimacy. Provides group verbal and written material to discuss and apply goal setting through the application of the S.M.A.R.T. Method.   Other Matters of the Heart: - Provides group verbal, written materials and models to describe Heart Failure, Angina, Valve Disease, and Diabetes in the realm of heart disease. Includes description of the disease process and treatment options available to the cardiac patient.   Exercise & Equipment Safety: - Individual verbal instruction and demonstration of equipment use and safety with use of the equipment.          Cardiac Rehab from 08/15/2015 in [facility] Cardiac and Pulmonary Rehab   Date  08/15/15   Educator  C. EnterkinRN   Instruction Review Code  1- partially meets, needs review/practice      Infection Prevention: - Provides verbal and written material to individual with discussion of infection control including proper hand washing and proper equipment cleaning during exercise session.      Cardiac Rehab from 08/15/2015 in [facility] Cardiac and Pulmonary Rehab   Date  08/15/15   Educator  C. EnterkinRN   Instruction Review Code  2- meets goals/outcomes      Falls Prevention: - Provides verbal and written material to individual with  discussion of falls prevention and safety.      Cardiac Rehab from 08/15/2015 in [facility] Cardiac and Pulmonary Rehab   Date  08/15/15   Educator  C. Enterkin   Instruction Review Code  2- meets goals/outcomes      Diabetes: - Individual verbal and written instruction to review signs/symptoms of diabetes, desired ranges of glucose level fasting, after meals and with exercise. Advice that pre and post exercise glucose checks will be done for 3 sessions at entry of program.    Knowledge Questionnaire Score:     Knowledge Questionnaire Score - 08/15/15 1413    Knowledge Questionnaire Score   Pre Score 23      Personal Goals and Risk Factors at Admission:     Personal Goals and Risk Factors at Admission - 08/15/15 1417    Core Components/Risk Factors/Patient Goals on Admission   Tobacco Cessation Yes   Expected Outcomes Long Term: Complete abstinence from all tobacco products for at least 12 months from quit date.  Delshon quit smoking his one pack per day for 34 years on Jul 22, 2015. Kori said his triggers are driving which he isn't able to drive until August 22, 2015.    Lipids Yes   Intervention Provide education and support for participant on nutrition & aerobic/resistive exercise along with prescribed medications to achieve LDL 70mg , HDL >40mg .   Expected Outcomes Short Term: Participant states understanding of desired cholesterol values and is compliant with medications prescribed. Participant is following exercise prescription and nutrition guidelines.;Long Term: Cholesterol controlled with medications as prescribed, with individualized exercise RX and with personalized nutrition plan. Value goals: LDL < 70mg , HDL > 40 mg.      Personal  Goals and Risk Factors Review:    Personal Goals Discharge (Final Personal Goals and Risk Factors Review):    ITP Comments:     ITP Comments      08/15/15 1414 08/21/15 0717         ITP Comments Frans said he feels his sternum pain  sometimes especially when he coughs or sneezes but just sitting in a chair he has no pain. Abbott is able to drive March 2, [id number], Adonai quit smoking July 22, 2015.  30 day review   Cotinue with ITP      Has attended orientation, expecting to start sessions soon         Comments:

## 2015-08-23 ENCOUNTER — Encounter: Payer: 59 | Attending: Cardiovascular Disease | Admitting: *Deleted

## 2015-08-23 DIAGNOSIS — Z951 Presence of aortocoronary bypass graft: Secondary | ICD-10-CM | POA: Diagnosis present

## 2015-08-23 NOTE — Progress Notes (Signed)
Daily Session Note  Patient Details  Name: Jeffrey Spence MRN: [id number] Date of Birth: July 14, 1960 Referring Provider:  Skeet Latch, MD  Encounter Date: 08/23/2015  Check In:     Session Check In - 08/23/15 0944    Check-In   Location [facility]-Cardiac & Pulmonary Rehab   Staff Present Heath Lark, RN, BSN, CCRP;Dezarai Prew, RN, Alex Gardener, PT   Supervising physician immediately available to respond to emergencies See telemetry face sheet for immediately available ER MD   Medication changes reported     No   Fall or balance concerns reported    No   Warm-up and Cool-down Performed on first and last piece of equipment   Resistance Training Performed Yes   VAD Patient? No         Goals Met:  Proper associated with RPD/PD & O2 Sat Exercise tolerated well  Goals Unmet:  Not Applicable  Comments:    Dr. Emily Filbert is Medical Director for [place] and [facility].

## 2015-08-26 ENCOUNTER — Other Ambulatory Visit: Payer: Self-pay | Admitting: Thoracic Surgery (Cardiothoracic Vascular Surgery)

## 2015-08-26 DIAGNOSIS — Z951 Presence of aortocoronary bypass graft: Secondary | ICD-10-CM

## 2015-08-26 NOTE — Progress Notes (Signed)
Daily Session Note  Patient Details  Name: Jeffrey Spence MRN: [id number] Date of Birth: [birth year] Referring Provider:  Skeet Latch, MD  Encounter Date: 08/26/2015  Check In:     Session Check In - 08/26/15 0847    Check-In   Location [facility]-Cardiac & Pulmonary Rehab   Staff Present Heath Lark, RN, BSN, CCRP;Ronya Gilcrest, Sherril Cong, BS, ACSM CEP, Exercise Physiologist   Supervising physician immediately available to respond to emergencies See telemetry face sheet for immediately available ER MD   Medication changes reported     No   Fall or balance concerns reported    No   Warm-up and Cool-down Performed on first and last piece of equipment   Resistance Training Performed Yes   VAD Patient? No   Pain Assessment   Currently in Pain? No/denies         Goals Met:  Independence with exercise equipment Exercise tolerated well No report of cardiac concerns or symptoms  Goals Unmet:  Not Applicable  Comments: Patient completed exercise prescription and all exercise goals during rehab session. The exercise was tolerated well and the patient is progressing in the program.    Dr. Emily Filbert is Medical Director for [place] and [facility].

## 2015-08-27 ENCOUNTER — Ambulatory Visit (INDEPENDENT_AMBULATORY_CARE_PROVIDER_SITE_OTHER): Payer: Self-pay | Admitting: Thoracic Surgery (Cardiothoracic Vascular Surgery)

## 2015-08-27 ENCOUNTER — Encounter: Payer: Self-pay | Admitting: Thoracic Surgery (Cardiothoracic Vascular Surgery)

## 2015-08-27 ENCOUNTER — Ambulatory Visit
Admission: RE | Admit: 2015-08-27 | Discharge: 2015-08-27 | Disposition: A | Discharge status: Home / Self Care | Payer: 59 | Source: Ambulatory Visit | Attending: Thoracic Surgery (Cardiothoracic Vascular Surgery) | Admitting: Thoracic Surgery (Cardiothoracic Vascular Surgery)

## 2015-08-27 VITALS — BP 99/63 | HR 58 | Resp 20 | Ht 68.0 in | Wt 164.0 lb

## 2015-08-27 DIAGNOSIS — Z951 Presence of aortocoronary bypass graft: Secondary | ICD-10-CM

## 2015-08-27 MED ORDER — METOPROLOL TARTRATE 25 MG PO TABS: 12.5000 mg | ORAL_TABLET | Freq: Two times a day (BID) | ORAL | Status: DC

## 2015-08-27 NOTE — Patient Instructions (Signed)
Change metoprolol to 1/2 tablet (12.5 mg) twice daily  Stop isosorbide after this prescription complete

## 2015-08-27 NOTE — Progress Notes (Signed)
[address]       [place] [address]             [phone]       HPI: Jeffrey Spence returns today for a scheduled postoperative follow-up visit.  He is a 55 year old man with multiple cardiac risk factors who presented with an unstable coronary syndrome and ruled in for non-ST elevation MI. He underwent coronary bypass grafting 3 with bilateral mammaries and a left radial artery on 07/25/2015. Postoperatively he had atrial fibrillation. He converted to sinus rhythm with amiodarone. He did not require anticoagulation. He went home on postoperative day #6.  Overall he is doing well. He denies any dizziness when he gets up. His energy levels remain low. He has arty started cardiac rehabilitation. He's not having much incisional pain in the sternum and has not taken any oxycodone or tramadol since the third or fourth day he was home. He does occasionally have a sharp pain in his left wrist. It can be quite severe. He has not had any recurrent anginal pain.  Past Medical History  Diagnosis Date  . Arthritis   . CAD (coronary artery disease)     a. nstemi 1.2017; b. cardiac cath 06/2015 with LM and severe 3 veseel dz (full report pending)  . HLD (hyperlipidemia)   . Tobacco abuse   . NSTEMI (non-ST elevated myocardial infarction) ([place]) 06/2015      Current Outpatient Prescriptions  Medication Sig Dispense Refill  . amiodarone (PACERONE) 200 MG tablet Take 1 tablet (200 mg total) by mouth daily. 30 tablet 6  . aspirin EC 325 MG EC tablet Take 1 tablet (325 mg total) by mouth daily. 30 tablet 0  . atorvastatin (LIPITOR) 80 MG tablet Take 1 tablet (80 mg total) by mouth daily at 6 PM. 30 tablet 3  . isosorbide mononitrate (IMDUR) 30 MG 24 hr tablet Take 0.5 tablets (15 mg total) by mouth daily. 30 tablet 0  . metoprolol tartrate (LOPRESSOR) 25 MG tablet Take 1 tablet (25 mg total) by mouth 2 (two) times daily. 60 tablet 3   No current facility-administered medications  for this visit.    Physical Exam BP 99/63 mmHg  Pulse 58  Resp 20  Ht 5\' 8"  (1.727 m)  Wt 164 lb (74.39 kg)  BMI 24.94 kg/m2  SpO9 22% 55 year old man in no acute distress Flat affect Alert and oriented 3 with no focal deficits Lungs clear with equal breath sounds bilaterally Cardiac regular rate and rhythm normal S1 and S2 Sternum stable, and sternal incision well-healed Left arm incision healing well, some numbness around the incision, sensation left hand intact  Diagnostic Tests: CHEST 2 VIEW  COMPARISON: Portable chest x-ray of August 11, 2015.  FINDINGS: The lungs are adequately inflated. There is persistent blunting of the left lateral and posterior costophrenic angles. The cardiac silhouette is normal in size. The perihilar interstitial markings are coarse but not greatly changed. The sternal wires are intact. The retrosternal soft tissues exhibit mild fullness inferiorly. The bony thorax exhibits no acute abnormality.  IMPRESSION: Small amount of residual pleural fluid or pleural thickening at the left lung base posteriorly. Trace right pleural effusion. No evidence of pulmonary edema or pneumonia.   Electronically Signed  By: David Martinique M.D.  On: 08/27/2015 09:50   Impression: 55 year old man who underwent coronary bypass grafting 3 with all arterial revascularization on 07/25/2015 after presenting with a non-ST elevation MI. Postoperatively he had atrial fibrillation which  converted to sinus rhythm with amiodarone and Lopressor.  Overall he is doing well. He's not having much pain from his sternal incision and is not having to take any narcotics. He does have an occasional sharp stabbing pain in his left wrist from where the radial artery was harvested. That should improve with time.  His heart rate and blood pressure are both low this morning. His amiodarone was decreased to 200 mg daily when he saw cardiology. I am going to decrease his  metoprolol to 12.5 mg twice a day.  He is on isosorbide for his radial graft. He can stop that medication when his current prescription runs out.  He may begin driving. Appropriate precautions were discussed.  He should not lift anything over 10 pounds for another 2 weeks or anything over 20 pounds for another 4 weeks. His work involves fairly heavy physical activity. We are looking at 3 months postop for return to work.  Plan: Decrease metoprolol to 12.5 mg by mouth twice a day  Stop isosorbide once current prescription runs out  Follow-up with Dr. Rockey Situ as scheduled  I will be happy to see him back any time if I can be of any further assistance with his care  Jeffrey Nakayama, MD [facility] [phone]

## 2015-08-28 ENCOUNTER — Encounter: Payer: 59 | Admitting: *Deleted

## 2015-08-28 DIAGNOSIS — Z951 Presence of aortocoronary bypass graft: Secondary | ICD-10-CM | POA: Diagnosis not present

## 2015-08-28 NOTE — Progress Notes (Signed)
Daily Session Note  Patient Details  Name: Jeffrey Spence MRN: [id number] Date of Birth: [birth year] Referring Provider:  Lucille Passy, MD  Encounter Date: 08/28/2015  Check In:     Session Check In - 08/28/15 0826    Check-In   Location [facility]-Cardiac & Pulmonary Rehab   Staff Present Candiss Norse, MS, ACSM CEP, Exercise Physiologist;Susanne Bice, RN, BSN, CCRP;Steven Way, BS, ACSM EP-C, Exercise Physiologist   Supervising physician immediately available to respond to emergencies See telemetry face sheet for immediately available ER MD   Medication changes reported     Yes   Comments Med list updated under medications   Fall or balance concerns reported    No   Warm-up and Cool-down Performed on first and last piece of equipment   Resistance Training Performed Yes   VAD Patient? No   Pain Assessment   Currently in Pain? No/denies   Multiple Pain Sites No         Goals Met:  Independence with exercise equipment Exercise tolerated well Personal goals reviewed No report of cardiac concerns or symptoms Strength training completed today  Goals Unmet:  Not Applicable  Comments: Patient completed exercise prescription and all exercise goals during rehab session. The exercise was tolerated well and the patient is progressing in the program.    Dr. Emily Filbert is Medical Director for [place] and [facility].

## 2015-08-30 ENCOUNTER — Encounter: Payer: 59 | Admitting: *Deleted

## 2015-08-30 DIAGNOSIS — Z951 Presence of aortocoronary bypass graft: Secondary | ICD-10-CM | POA: Diagnosis not present

## 2015-08-30 NOTE — Progress Notes (Signed)
Daily Session Note  Patient Details  Name: Jeffrey Spence MRN: [id number] Date of Birth: 10-Dec-1960 Referring Provider:  Skeet Latch, MD  Encounter Date: 08/30/2015  Check In:     Session Check In - 08/30/15 0926    Check-In   Location [facility]   Staff Present Candiss Norse, MS, ACSM CEP, Exercise Physiologist;Carroll Enterkin, RN, BSN;Stacey Blanch Media, RRT, RCP, Respiratory Therapist   Supervising physician immediately available to respond to emergencies See telemetry face sheet for immediately available ER MD   Medication changes reported     No   Fall or balance concerns reported    No   Warm-up and Cool-down Performed on first and last piece of equipment   Resistance Training Performed Yes   VAD Patient? No   Pain Assessment   Currently in Pain? No/denies   Multiple Pain Sites No           Exercise Prescription Changes - 08/30/15 0900    Exercise Review   Progression Yes   Response to Exercise   Symptoms None   Comments Reviewed individualized exercise prescription and made increases per departmental policy. Exercise increases were discussed with the patient and they were able to perform the new work loads without issue (no signs or symptoms).    Duration Progress to 45 minutes of aerobic exercise without signs/symptoms of physical distress   Intensity Rest + 30   Progression   Progression Continue progressive overload as per policy without signs/symptoms or physical distress.   Resistance Training   Training Prescription Yes   Weight 3   Reps 10-15   Treadmill   MPH 3.5   Grade 4   Minutes 10   Recumbant Bike   Level 2   RPM 40   Watts 20   Minutes 10   NuStep   Level 2   Watts 40   Minutes 10   Arm Ergometer   Level 1   Watts 10   Minutes 10   Recumbant Elliptical   Level 2   RPM 40   Watts 20   Minutes 10   Elliptical   Level 2   Speed 4.5   Minutes 10   REL-XR   Level 2   Watts 40   Minutes 10   T5 Nustep   Level 1   Watts 15   Minutes 10   Biostep-RELP   Level 2   Watts 40   Minutes 10      Goals Met:  Independence with exercise equipment Exercise tolerated well Personal goals reviewed No report of cardiac concerns or symptoms Strength training completed today  Goals Unmet:  Not Applicable  Comments: Patient completed exercise prescription and all exercise goals during rehab session. The exercise was tolerated well and the patient is progressing in the program.    Dr. Emily Filbert is Medical Director for [place] and [facility].

## 2015-09-02 ENCOUNTER — Encounter: Payer: 59 | Admitting: *Deleted

## 2015-09-02 DIAGNOSIS — Z951 Presence of aortocoronary bypass graft: Secondary | ICD-10-CM | POA: Diagnosis not present

## 2015-09-02 NOTE — Progress Notes (Signed)
Daily Session Note  Patient Details  Name: Jeffrey Spence MRN: [id number] Date of Birth: 1960/06/23 Referring Provider:  Skeet Latch, MD  Encounter Date: 09/02/2015  Check In:     Session Check In - 09/02/15 0837    Check-In   Location [facility]   Staff Present Earlean Shawl, BS, ACSM CEP, Exercise Physiologist;Renee Dillard Essex, MS, ACSM CEP, Exercise Physiologist;Susanne Bice, RN, BSN, CCRP   Supervising physician immediately available to respond to emergencies See telemetry face sheet for immediately available ER MD   Medication changes reported     No   Fall or balance concerns reported    No   Warm-up and Cool-down Performed on first and last piece of equipment   Resistance Training Performed Yes   VAD Patient? No   Pain Assessment   Currently in Pain? No/denies   Multiple Pain Sites No         Goals Met:  Independence with exercise equipment Exercise tolerated well No report of cardiac concerns or symptoms Strength training completed today  Goals Unmet:  Not Applicable  Comments: Patient completed exercise prescription and all exercise goals during rehab session. The exercise was tolerated well and the patient is progressing in the program.     Dr. Emily Filbert is Medical Director for [place] and [facility].

## 2015-09-03 ENCOUNTER — Telehealth: Payer: Self-pay | Admitting: Cardiovascular Disease

## 2015-09-03 NOTE — Telephone Encounter (Signed)
Received [employer] paperwork needing MD completion and signature. Mandi placed in Dr. Donivan Scull "Urgent" folder

## 2015-09-04 DIAGNOSIS — Z951 Presence of aortocoronary bypass graft: Secondary | ICD-10-CM

## 2015-09-04 NOTE — Progress Notes (Signed)
Daily Session Note  Patient Details  Name: TREVELL PARISEAU MRN: [id number] Date of Birth: 04-03-1961 Referring Provider:  Skeet Latch, MD  Encounter Date: 09/04/2015  Check In:     Session Check In - 09/04/15 0824    Check-In   Location [facility]   Staff Present Heath Lark, RN, BSN, CCRP;Renee Dillard Essex, MS, ACSM CEP, Exercise Physiologist;Alphonso Gregson, BS, ACSM EP-C, Exercise Physiologist   Supervising physician immediately available to respond to emergencies See telemetry face sheet for immediately available ER MD   Medication changes reported     No   Fall or balance concerns reported    No   Warm-up and Cool-down Performed on first and last piece of equipment   Resistance Training Performed No   VAD Patient? No   Pain Assessment   Currently in Pain? No/denies         Goals Met:  Proper associated with RPD/PD & O2 Sat Exercise tolerated well No report of cardiac concerns or symptoms Strength training completed today  Goals Unmet:  Not Applicable  Comments:    Dr. Emily Filbert is Medical Director for [place] and [facility].

## 2015-09-05 NOTE — Telephone Encounter (Signed)
Patient is wondering where we are with fmla.  Elmyra Ricks form [employer] calling to check status of papers.  Please let me know and I will call her back. Agar!!!

## 2015-09-06 ENCOUNTER — Encounter: Payer: 59 | Admitting: *Deleted

## 2015-09-06 ENCOUNTER — Telehealth: Payer: Self-pay | Admitting: Physician Assistant

## 2015-09-06 DIAGNOSIS — Z951 Presence of aortocoronary bypass graft: Secondary | ICD-10-CM

## 2015-09-06 NOTE — Telephone Encounter (Signed)
Spoke to patient and let him know that documents are ready for pick up.

## 2015-09-06 NOTE — Progress Notes (Signed)
Daily Session Note  Patient Details  Name: Jeffrey Spence MRN: [id number] Date of Birth: 26-Dec-1960 Referring Provider:  Lucille Passy, MD  Encounter Date: 09/06/2015  Check In:     Session Check In - 09/06/15 0821    Check-In   Location [facility]   Staff Present Heath Lark, RN, BSN, CCRP;Carroll Enterkin, RN, Drusilla Kanner, MS, ACSM CEP, Exercise Physiologist   Supervising physician immediately available to respond to emergencies See telemetry face sheet for immediately available ER MD   Medication changes reported     No   Fall or balance concerns reported    No   Warm-up and Cool-down Performed on first and last piece of equipment   Resistance Training Performed Yes   VAD Patient? No   Pain Assessment   Currently in Pain? No/denies   Multiple Pain Sites No           Exercise Prescription Changes - 09/06/15 0800    Exercise Review   Progression Yes   Response to Exercise   Blood Pressure (Admit) 112/76 mmHg   Blood Pressure (Exercise) 154/70 mmHg   Blood Pressure (Exit) 114/76 mmHg   Heart Rate (Admit) 73 bpm   Heart Rate (Exercise) 105 bpm   Heart Rate (Exit) 71 bpm   Rating of Perceived Exertion (Exercise) 12   Symptoms None   Comments Extended jogging intervals to 2 minutes followed by 3-4 minutes at moderate pace. Isaias is making great progress on the TM work and does well independently adjusting his intervals.    Duration Progress to 45 minutes of aerobic exercise without signs/symptoms of physical distress   Intensity Rest + 30   Progression   Progression Continue progressive overload as per policy without signs/symptoms or physical distress.   Resistance Training   Training Prescription Yes   Weight 3   Reps 10-15   Interval Training   Interval Training Yes   Equipment Treadmill   Comments 3-4 min walking at 3.2 mph followed by 2 min of jogging at 4.5 mph, repeat for 25 min   Treadmill   MPH 4.5   Grade 0   Minutes 30   Recumbant Bike   Level 2   RPM 40   Watts 20   Minutes 10   NuStep   Level 2   Watts 40   Minutes 10   Arm Ergometer   Level 1   Watts 10   Minutes 10   Recumbant Elliptical   Level 2   RPM 40   Watts 20   Minutes 10   Elliptical   Level 2   Speed 4.5   Minutes 10   REL-XR   Level 3   Watts 40   Minutes 10   T5 Nustep   Level 1   Watts 15   Minutes 10   Biostep-RELP   Level 2   Watts 40   Minutes 10      Goals Met:  Independence with exercise equipment Exercise tolerated well Personal goals reviewed No report of cardiac concerns or symptoms Strength training completed today  Goals Unmet:  Not Applicable  Comments: Patient completed exercise prescription and all exercise goals during rehab session. The exercise was tolerated well and the patient is progressing in the program.   Dr. Emily Filbert is Medical Director for [place] and [facility].

## 2015-09-09 ENCOUNTER — Encounter: Payer: Self-pay | Admitting: Family Medicine

## 2015-09-09 ENCOUNTER — Encounter: Payer: 59 | Admitting: *Deleted

## 2015-09-09 ENCOUNTER — Ambulatory Visit (INDEPENDENT_AMBULATORY_CARE_PROVIDER_SITE_OTHER): Payer: 59 | Admitting: Family Medicine

## 2015-09-09 VITALS — BP 90/60 | HR 65 | Temp 97.5°F | Ht 68.0 in | Wt 163.5 lb

## 2015-09-09 DIAGNOSIS — M25512 Pain in left shoulder: Secondary | ICD-10-CM | POA: Diagnosis not present

## 2015-09-09 DIAGNOSIS — Z951 Presence of aortocoronary bypass graft: Secondary | ICD-10-CM | POA: Diagnosis not present

## 2015-09-09 NOTE — Patient Instructions (Signed)

## 2015-09-09 NOTE — Progress Notes (Signed)
Daily Session Note  Patient Details  Name: NICHAEL EHLY MRN: [id number] Date of Birth: 02-13-1961 Referring Provider:  Skeet Latch, MD  Encounter Date: 09/09/2015  Check In:     Session Check In - 09/09/15 0830    Check-In   Location [facility]   Staff Present Earlean Shawl, BS, ACSM CEP, Exercise Physiologist;Susanne Bice, RN, BSN, CCRP;Renee Dillard Essex, MS, ACSM CEP, Exercise Physiologist   Supervising physician immediately available to respond to emergencies See telemetry face sheet for immediately available ER MD   Medication changes reported     No   Fall or balance concerns reported    No   Warm-up and Cool-down Performed on first and last piece of equipment   Resistance Training Performed Yes   VAD Patient? No   Pain Assessment   Currently in Pain? No/denies   Multiple Pain Sites No         Goals Met:  Independence with exercise equipment Exercise tolerated well No report of cardiac concerns or symptoms Strength training completed today  Goals Unmet:  Not Applicable  Comments: Patient completed exercise prescription and all exercise goals during rehab session. The exercise was tolerated well and the patient is progressing in the program.     Dr. Emily Filbert is Medical Director for [place] and [facility].

## 2015-09-09 NOTE — Progress Notes (Signed)
Pre visit review using our clinic review tool, if applicable. No additional management support is needed unless otherwise documented below in the visit note. 

## 2015-09-09 NOTE — Progress Notes (Signed)
Dr. Frederico Hamman T. Tatsuo Musial, MD, [facility] [place], [address] Phone: [phone] Fax: [phone]  09/09/2015  Patient: Jeffrey Spence, MRN: [id number], DOB: June 26, 1960, 55 y.o.  Primary Physician:  Arnette Norris, MD   Chief Complaint  Patient presents with  . Shoulder Pain    Left   Subjective:   Jeffrey Spence is a 55 y.o. very pleasant male patient who presents with the following:  Limited ROM of the L shoulder. Ongoing for 1 year.  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   He does not recall a specific injury mechanism. He has been having pain with his left shoulder for approximately one year, and he has had specifically pain and loss of motion in the plane of abduction. Other planes of motion appear to be grossly normal. He is very active and does a lot of lifting at work.  He had CABG about 6 weeks ago secondary to multiple blocked coronaries and had a recent n-STEMI.  Past Medical History, Surgical History, Social History, Family History, Problem List, Medications, and Allergies have been reviewed and updated if relevant.  Patient Active Problem List   Diagnosis Date Noted  . Coronary artery disease involving native coronary artery of native heart with unstable angina pectoris ([place])   . S/P CABG x 3 07/25/2015  . NSTEMI (non-ST elevated myocardial infarction) ([place])   . Acute coronary syndrome ([place])   . Unstable angina pectoris ([place])   . Smoker   . Coronary artery disease involving left main coronary artery   . Chest pain 07/22/2015  . Swelling of arm 01/20/2013  . Routine general medical examination at a health care facility 01/12/2013  . OA (osteoarthritis) of knee 01/12/2013  . TESTICULAR HYPOFUNCTION 08/14/2010  . TOBACCO ABUSE 08/12/2010    Past Medical History  Diagnosis Date  . Arthritis   . CAD (coronary artery disease)     a. nstemi 1.2017; b. cardiac cath 06/2015 with LM  and severe 3 veseel dz (full report pending)  . HLD (hyperlipidemia)   . Tobacco abuse   . NSTEMI (non-ST elevated myocardial infarction) ([place]) 06/2015    Past Surgical History  Procedure Laterality Date  . Fractured toe Right 2013    great toe/with pin  . Cardiac catheterization Bilateral 07/23/2015    Procedure: Left Heart Cath and Coronary Angiography;  Surgeon: Minna Merritts, MD;  Location: [facility];  Service: Cardiovascular;  Laterality: Bilateral;  . Coronary artery bypass graft N/A 07/25/2015    Procedure: CORONARY ARTERY BYPASS GRAFTING X 3 UTILIZING BILATERAL IMA AND LEFT RADIAL ARTERY;  Surgeon: Melrose Nakayama, MD;  Location: [facility];  Service: Open Heart Surgery;  Laterality: N/A;  . Radial artery harvest Left 07/25/2015    Procedure: RADIAL ARTERY HARVEST;  Surgeon: Melrose Nakayama, MD;  Location: [facility];  Service: Open Heart Surgery;  Laterality: Left;  . Tee without cardioversion N/A 07/25/2015    Procedure: TRANSESOPHAGEAL ECHOCARDIOGRAM (TEE);  Surgeon: Melrose Nakayama, MD;  Location: [facility];  Service: Open Heart Surgery;  Laterality: N/A;    Social History   Social History  . Marital Status: Married    Spouse Name: N/A  . Number of Children: 7  . Years of Education: N/A   Occupational History  .     Social History Main Topics  . Smoking status: Former Smoker -- 1.00 packs/day for 30 years  Types: Cigarettes    Quit date: 07/17/2015  . Smokeless tobacco: Never Used  . Alcohol Use: 0.0 oz/week    0 Standard drinks or equivalent per week     Comment: rare  . Drug Use: No  . Sexual Activity: Not on file   Other Topics Concern  . Not on file   Social History Narrative   Regular exercise--yes    Family History  Problem Relation Age of Onset  . Arthritis Mother     ra  . Heart attack Father 89  . Cancer Father   . Cancer Paternal Uncle     leukemia  . Colon cancer Neg Hx     No Known Allergies  Medication list reviewed and  updated in full in [facility].  GEN: No fevers, chills. Nontoxic. Primarily MSK c/o today. MSK: Detailed in the HPI GI: tolerating PO intake without difficulty Neuro: No numbness, parasthesias, or tingling associated. Otherwise the pertinent positives of the ROS are noted above.   Objective:   BP 90/60 mmHg  Pulse 65  Temp(Src) 97.5 F (36.4 C) (Oral)  Ht 5\' 8"  (1.727 m)  Wt 163 lb 8 oz (74.163 kg)  BMI 24.87 kg/m2   GEN: WDWN, NAD, Non-toxic, Alert & Oriented x 3 HEENT: Atraumatic, Normocephalic.  Ears and Nose: No external deformity. EXTR: No clubbing/cyanosis/edema NEURO: Normal gait.  PSYCH: Normally interactive. Conversant. Not depressed or anxious appearing.  Calm demeanor.   Shoulder: L Inspection: No muscle wasting or winging Ecchymosis/edema: neg  AC joint, scapula, clavicle: NT Cervical spine: NT, full ROM Spurling's: neg Abduction: 110 passively, 4/5 Flexion: full, 5/5 IR, full, lift-off: 5/5 ER at neutral: full, 5/5 AC crossover and compression: neg Neer: neg Hawkins: neg Drop Test: neg Empty Can: neg Supraspinatus insertion: NT Bicipital groove: NT Speed's: neg Yergason's: neg Sulcus sign: neg Scapular dyskinesis: none C5-T1 intact Sensation intact Grip 5/5   Radiology: CLINICAL DATA:  Left shoulder pain   EXAM: LEFT SHOULDER - 2+ VIEW   COMPARISON:  None.   FINDINGS: Three views of the left shoulder submitted. No acute fracture or subluxation. No radiopaque foreign body.   IMPRESSION: Negative.     Electronically Signed   By: Lahoma Crocker M.D.   On: 07/19/2015 16:27   Assessment and Plan:   Left shoulder pain - Plan: MR Shoulder Left Wo Contrast  Failure to improve with traditional therapy including one year of conservative management with over-the-counter medication including NSAIDs and Tylenol. Traditional range of motion work and home rehabilitation, and he still has persistent decrease in strength or loss of motion in  the plane of abduction. Frozen shoulder but also be in the differential, but classically is associated with loss in 3 planes of motion. Obtain an MRI of the left shoulder to evaluate for rotator cuff tear.  Follow-up: depends on MRI findings.   Orders Placed This Encounter  Procedures  . MR Shoulder Left Wo Contrast    Signed,  Aideen Fenster T. Joslyne Marshburn, MD   Patient's Medications  New Prescriptions   No medications on file  Previous Medications   AMIODARONE (PACERONE) 200 MG TABLET    Take 1 tablet (200 mg total) by mouth daily.   ASPIRIN EC 325 MG EC TABLET    Take 1 tablet (325 mg total) by mouth daily.   ATORVASTATIN (LIPITOR) 80 MG TABLET    Take 1 tablet (80 mg total) by mouth daily at 6 PM.   METOPROLOL TARTRATE (LOPRESSOR) 25 MG TABLET  Take 0.5 tablets (12.5 mg total) by mouth 2 (two) times daily.  Modified Medications   No medications on file  Discontinued Medications   ISOSORBIDE MONONITRATE (IMDUR) 30 MG 24 HR TABLET    Take 0.5 tablets (15 mg total) by mouth daily.

## 2015-09-11 DIAGNOSIS — Z951 Presence of aortocoronary bypass graft: Secondary | ICD-10-CM | POA: Diagnosis not present

## 2015-09-11 NOTE — Progress Notes (Signed)
Daily Session Note  Patient Details  Name: Jeffrey Spence MRN: [id number] Date of Birth: [birth year] Referring Provider:  Skeet Latch, MD  Encounter Date: 09/11/2015  Check In:     Session Check In - 09/11/15 0823    Check-In   Location [facility]   Staff Present Heath Lark, RN, BSN, CCRP;Renee Dillard Essex, MS, ACSM CEP, Exercise Physiologist;Crystol Walpole, BS, ACSM EP-C, Exercise Physiologist   Supervising physician immediately available to respond to emergencies See telemetry face sheet for immediately available ER MD   Medication changes reported     No   Fall or balance concerns reported    No   Warm-up and Cool-down Performed on first and last piece of equipment   Resistance Training Performed No   VAD Patient? No   Pain Assessment   Currently in Pain? No/denies         Goals Met:  Proper associated with RPD/PD & O2 Sat Exercise tolerated well No report of cardiac concerns or symptoms Strength training completed today  Goals Unmet:  Not Applicable  Comments:    Dr. Emily Filbert is Medical Director for [facility] and [facility].

## 2015-09-12 ENCOUNTER — Encounter: Payer: Self-pay | Admitting: *Deleted

## 2015-09-12 DIAGNOSIS — Z951 Presence of aortocoronary bypass graft: Secondary | ICD-10-CM

## 2015-09-12 NOTE — Progress Notes (Signed)
Cardiac Individual Treatment Plan  Patient Details  Name: Jeffrey Spence MRN: [id number] Date of Birth: 01-14-1961 Referring Provider:  Skeet Latch, MD  Initial Encounter Date:       Cardiac Rehab from 08/15/2015 in [facility] Cardiac and Pulmonary Rehab   Date  08/15/15      Visit Diagnosis: S/P CABG x 3  Patient's Home Medications on Admission:  Current outpatient prescriptions:  .  amiodarone (PACERONE) 200 MG tablet, Take 1 tablet (200 mg total) by mouth daily., Disp: 30 tablet, Rfl: 6 .  aspirin EC 325 MG EC tablet, Take 1 tablet (325 mg total) by mouth daily., Disp: 30 tablet, Rfl: 0 .  atorvastatin (LIPITOR) 80 MG tablet, Take 1 tablet (80 mg total) by mouth daily at 6 PM., Disp: 30 tablet, Rfl: 3 .  metoprolol tartrate (LOPRESSOR) 25 MG tablet, Take 0.5 tablets (12.5 mg total) by mouth 2 (two) times daily., Disp: 60 tablet, Rfl: 3  Past Medical History: Past Medical History  Diagnosis Date  . Arthritis   . CAD (coronary artery disease)     a. nstemi 1.2017; b. cardiac cath 06/2015 with LM and severe 3 veseel dz (full report pending)  . HLD (hyperlipidemia)   . Tobacco abuse   . NSTEMI (non-ST elevated myocardial infarction) ([place]) 06/2015    Tobacco Use: History  Smoking status  . Former Smoker -- 1.00 packs/day for 30 years  . Types: Cigarettes  . Quit date: 07/17/2015  Smokeless tobacco  . Never Used    Labs: Recent Review Flowsheet Data    Labs for ITP Cardiac and Pulmonary Rehab Latest Ref Rng 07/25/2015 07/25/2015 07/25/2015 07/25/2015 07/26/2015   PHART 7.350 - 7.450 7.341(L) 7.333(L) 7.358 - -   PCO2ART 35.0 - 45.0 mmHg 39.6 33.9(L) 36.7 - -   HCO3 20.0 - 24.0 mEq/L 21.6 17.9(L) 20.5 - -   TCO2 0 - 100 mmol/L _0 ACIDBASEDEF 0.0 - 2.0 mmol/L 4.0(H) 7.0(H) 4.0(H) - -   O2SAT - 96.0 97.0 97.0 - -       Exercise Target Goals:    Exercise Program Goal: Individual exercise prescription set with THRR, safety & activity barriers. Participant  demonstrates ability to understand and report RPE using BORG scale, to self-measure pulse accurately, and to acknowledge the importance of the exercise prescription.  Exercise Prescription Goal: Starting with aerobic activity 30 plus minutes a day, 3 days per week for initial exercise prescription. Provide home exercise prescription and guidelines that participant acknowledges understanding prior to discharge.  Activity Barriers & Risk Stratification:     Activity Barriers & Cardiac Risk Stratification - 08/15/15 1258    Activity Barriers & Cardiac Risk Stratification   Activity Barriers Other (comment)  "sternum from CABG "WEight restirction of 10 lbs   Cardiac Risk Stratification High      6 Minute Walk:     6 Minute Walk      08/15/15 1409       6 Minute Walk   Phase Initial     Distance 1525 feet     Walk Time 6 minutes     # of Rest Breaks 0     Symptoms No     Resting HR 61 bpm     Resting BP 128/60 mmHg     Max Ex. HR 90 bpm     Max Ex. BP 128/64 mmHg        Initial Exercise Prescription:     Initial Exercise  Prescription - 08/15/15 1400    Date of Initial Exercise Prescription   Date 08/15/15   Treadmill   MPH 2.5   Grade 0   Minutes 10   Recumbant Bike   Level 2   RPM 40   Watts 20   Minutes 10   NuStep   Level 2   Watts 40   Minutes 10   Arm Ergometer   Level 1   Watts 10   Minutes 10   Recumbant Elliptical   Level 2   RPM 40   Watts 20   Minutes 10   REL-XR   Level 2   Watts 40   Minutes 10   T5 Nustep   Level 1   Watts 15   Minutes 10   Biostep-RELP   Level 2   Watts 40   Minutes 10   Prescription Details   Frequency (times per week) 3   Duration Progress to 30 minutes of continuous aerobic without signs/symptoms of physical distress   Intensity   THRR REST +  30   Ratings of Perceived Exertion 11-15   Perceived Dyspnea 2-4   Progression   Progression Continue progressive overload as per policy without signs/symptoms or  physical distress.   Resistance Training   Training Prescription Yes   Weight 1   Reps 10-15      Perform Capillary Blood Glucose checks as needed.  Exercise Prescription Changes:     Exercise Prescription Changes      08/30/15 0900 09/02/15 1200 09/06/15 0800 09/11/15 0800     Exercise Review   Progression Yes Yes Yes Yes    Response to Exercise   Blood Pressure (Admit)  112/76 mmHg 112/76 mmHg     Blood Pressure (Exercise)  154/70 mmHg 154/70 mmHg     Blood Pressure (Exit)  114/76 mmHg 114/76 mmHg     Heart Rate (Admit)  73 bpm 73 bpm     Heart Rate (Exercise)  105 bpm 105 bpm     Heart Rate (Exit)  71 bpm 71 bpm     Rating of Perceived Exertion (Exercise)  12 12     Symptoms None None None None    Comments Reviewed individualized exercise prescription and made increases per departmental policy. Exercise increases were discussed with the patient and they were able to perform the new work loads without issue (no signs or symptoms).  Started Jeffrey Spence on jogging intervals and he did very well and wants to eventually jog continuously and train for a 5K. We will steadily increase his jogging intervals in order to achieve this goal.  Extended jogging intervals to 2 minutes followed by 3-4 minutes at moderate pace. Jeffrey Spence is making great progress on the TM work and does well independently adjusting his intervals.      Duration Progress to 45 minutes of aerobic exercise without signs/symptoms of physical distress Progress to 45 minutes of aerobic exercise without signs/symptoms of physical distress Progress to 45 minutes of aerobic exercise without signs/symptoms of physical distress Progress to 45 minutes of aerobic exercise without signs/symptoms of physical distress    Intensity Rest + 30 Rest + 30 Rest + 30 Rest + 30    Progression   Progression Continue progressive overload as per policy without signs/symptoms or physical distress. Continue progressive overload as per policy without  signs/symptoms or physical distress. Continue progressive overload as per policy without signs/symptoms or physical distress. Continue progressive overload as per policy without signs/symptoms or physical  distress.    Resistance Training   Training Prescription Yes Yes Yes Yes    Weight _0 Reps 10-15 10-15 10-15 10-15    Interval Training   Interval Training  Yes Yes Yes    Equipment  Treadmill Treadmill Treadmill    Comments  3 min walking at 3.2 mph followed by 1 min of jogging at 4.5 mph, repeat for 25 min 3-4 min walking at 3.2 mph followed by 2 min of jogging at 4.5 mph, repeat for 25 min 3-4 min walking at 3.2 mph followed by 2 min of jogging at 4.5 mph, repeat for 25 min    Treadmill   MPH 3.5 4.5 4.5 4.5    Grade 4 0 0 0    Minutes _1 Recumbant Bike   Level _2 RPM 40 40 40 40    Watts _3 Minutes _4 NuStep   Level _5 Watts 40 40 40 40    Minutes _6 Arm Ergometer   Level _7 Watts _8 Minutes _9 Recumbant Elliptical   Level _10 RPM 40 40 40 40    Watts _11 Minutes _12 Elliptical   Level _13 Speed 4.5 4.5 4.5 5    Minutes _14 REL-XR   Level _15 Watts 40 40 40 40    Minutes _16 T5 Nustep   Level _17 Watts _18 Minutes _19 Biostep-RELP   Level _20 Watts 40 40 40 40    Minutes _21 Exercise Comments:   Discharge Exercise Prescription (Final Exercise Prescription Changes):     Exercise Prescription Changes - 09/11/15 0800    Exercise Review   Progression Yes   Response to Exercise   Symptoms None   Duration Progress to 45 minutes of aerobic exercise without signs/symptoms of physical distress   Intensity Rest + 30   Progression   Progression Continue progressive overload as per policy without signs/symptoms or physical distress.   Resistance  Training   Training Prescription Yes   Weight 3   Reps 10-15   Interval Training   Interval Training Yes   Equipment Treadmill   Comments 3-4 min walking at 3.2 mph followed by 2 min of jogging at 4.5 mph, repeat for 25 min   Treadmill   MPH 4.5   Grade 0   Minutes 30   Recumbant Bike   Level 2   RPM 40   Watts 20   Minutes 10   NuStep   Level 2   Watts 40   Minutes 10   Arm Ergometer   Level 1   Watts 10   Minutes 10   Recumbant Elliptical   Level 2   RPM 40   Watts 20   Minutes 10  Elliptical   Level 4   Speed 5   Minutes 6   REL-XR   Level 3   Watts 40   Minutes 10   T5 Nustep   Level 1   Watts 15   Minutes 10   Biostep-RELP   Level 2   Watts 40   Minutes 10      Nutrition:  Target Goals: Understanding of nutrition guidelines, daily intake of sodium <1586m, cholesterol <2055m calories 30% from fat and 7% or less from saturated fats, daily to have 5 or more servings of fruits and vegetables.  Biometrics:     Pre Biometrics - 08/15/15 1411    Pre Biometrics   Height 5' 8.4" (1.737 m)   Weight 164 lb 6.4 oz (74.571 kg)   Waist Circumference 38.5 inches   Hip Circumference 39 inches   Waist to Hip Ratio 0.99 %   BMI (Calculated) 24.8       Nutrition Therapy Plan and Nutrition Goals:     Nutrition Therapy & Goals - 08/29/15 1207    Nutrition Therapy   Diet Instructed on a meal plan based on 1800 calories including DASH diet principles.   Drug/Food Interactions Statins/Certain Fruits   Protein (specify units) 8   Fiber 30 grams   Whole Grain Foods 3 servings   Saturated Fats 12 max. grams   Fruits and Vegetables 5 servings/day   Sodium 1500 grams   Personal Nutrition Goals   Personal Goal #1 Increase fruits/vegetables with a goal of 5 servings per day.   Personal Goal #2 Try very low sodium tuna.   Personal Goal #3 When eating out, request that no salt be added.   Intervention Plan   Intervention Prescribe, educate and counsel  regarding individualized specific dietary modifications aiming towards targeted core components such as weight, hypertension, lipid management, diabetes, heart failure and other comorbidities.;Nutrition handout(s) given to patient.   Expected Outcomes Short Term Goal: Understand basic principles of dietary content, such as calories, fat, sodium, cholesterol and nutrients.;Short Term Goal: A plan has been developed with personal nutrition goals set during dietitian appointment.;Long Term Goal: Adherence to prescribed nutrition plan.      Nutrition Discharge: Rate Your Plate Scores:   Nutrition Goals Re-Evaluation:   Psychosocial: Target Goals: Acknowledge presence or absence of depression, maximize coping skills, provide positive support system. Participant is able to verbalize types and ability to use techniques and skills needed for reducing stress and depression.  Initial Review & Psychosocial Screening:     Initial Psych Review & Screening - 08/15/15 1418    Initial Review   Current issues with Current Sleep Concerns   Family Dynamics   Good Support System? Yes   Barriers   Psychosocial barriers to participate in program The patient should benefit from training in stress management and relaxation.   Screening Interventions   Interventions Encouraged to exercise      Quality of Life Scores:     Quality of Life - 08/15/15 1432    Quality of Life Scores   Health/Function Pre 13.71 %   Socioeconomic Pre 15.59 %   Psych/Spiritual Pre 15.71 %   Family Pre 19.2 %   GLOBAL Pre 15.3 %      PHQ-9:     Recent Review Flowsheet Data    [facility]   Decreased Interest 0   Down, Depressed, Hopeless 1   PHQ - 2 Score 1   Altered sleeping 3   Tired,  decreased energy 1   Change in appetite 1   Feeling bad or failure about yourself  0   Trouble concentrating 0   Moving slowly or fidgety/restless 0   Suicidal thoughts 0   PHQ-9 Score 6   Difficult  doing work/chores Somewhat difficult      Psychosocial Evaluation and Intervention:     Psychosocial Evaluation - 08/26/15 0949    Psychosocial Evaluation & Interventions   Interventions Encouraged to exercise with the program and follow exercise prescription;Relaxation education;Stress management education   Comments Counselor met with Jeffrey Spence today for initial psychosocial evaluation.  He is a 55 year old who had triple bypass approximately one month ago.  He has a strong support system with a spouse of 7 years and (7) children several who are adults.  He also has his mother and father-in-law close by and is actively involved in his local faith community.  Jeffrey Spence states that he is sleeping somewhat better since the surgery and has a goos appetite.  He admits to a history of undiagnosed/untreated depression as well as some current symptoms but his current mood is optimistic.  Jeffrey Spence states that he is currently unable to work, but job stress is something he has experienced a great deal of, as well as his health currently is a stressor.  He has goals to be able to run on the treadmill before he completes this program in order to be able to run outside upon discharge.  He also enjoys walking outside.  Counselor recommended monitoring Jeffrey Spence to see if his mood overall is improved with consistent exercise and education in this program.  If not, Counselor will recommend a therapist or a medication evaluation at that time.     Continued Psychosocial Services Needed Yes  Jeffrey Spence will benefit from the psychoeducational components of this program, especially stress management and depression, as well as consistent exercise.        Psychosocial Re-Evaluation:   Vocational Rehabilitation: Provide vocational rehab assistance to qualifying candidates.   Vocational Rehab Evaluation & Intervention:     Vocational Rehab - 08/15/15 1300    Initial Vocational Rehab Evaluation & Intervention    Assessment shows need for Vocational Rehabilitation No      Education: Education Goals: Education classes will be provided on a weekly basis, covering required topics. Participant will state understanding/return demonstration of topics presented.  Learning Barriers/Preferences:     Learning Barriers/Preferences - 08/15/15 1412    Learning Barriers/Preferences   Learning Barriers None   Learning Preferences None      Education Topics: General Nutrition Guidelines/Fats and Fiber: -Group instruction provided by verbal, written material, models and posters to present the general guidelines for heart healthy nutrition. Gives an explanation and review of dietary fats and fiber.   Controlling Sodium/Reading Food Labels: -Group verbal and written material supporting the discussion of sodium use in heart healthy nutrition. Review and explanation with models, verbal and written materials for utilization of the food label.   Exercise Physiology & Risk Factors: - Group verbal and written instruction with models to review the exercise physiology of the cardiovascular system and associated critical values. Details cardiovascular disease risk factors and the goals associated with each risk factor.   Aerobic Exercise & Resistance Training: - Gives group verbal and written discussion on the health impact of inactivity. On the components of aerobic and resistive training programs and the benefits of this training and how to safely progress through these programs.  Flexibility, Balance, General Exercise Guidelines: - Provides group verbal and written instruction on the benefits of flexibility and balance training programs. Provides general exercise guidelines with specific guidelines to those with heart or lung disease. Demonstration and skill practice provided.          Cardiac Rehab from 09/11/2015 in [facility] Cardiac and Pulmonary Rehab   Date  08/26/15   Educator  [facility]   Instruction Review  Code  2- meets goals/outcomes      Stress Management: - Provides group verbal and written instruction about the health risks of elevated stress, cause of high stress, and healthy ways to reduce stress.      Cardiac Rehab from 09/11/2015 in [facility] Cardiac and Pulmonary Rehab   Date  08/28/15   Educator  [facility]   Instruction Review Code  2- meets goals/outcomes      Depression: - Provides group verbal and written instruction on the correlation between heart/lung disease and depressed mood, treatment options, and the stigmas associated with seeking treatment.   Anatomy & Physiology of the Heart: - Group verbal and written instruction and models provide basic cardiac anatomy and physiology, with the coronary electrical and arterial systems. Review of: AMI, Angina, Valve disease, Heart Failure, Cardiac Arrhythmia, Pacemakers, and the ICD.      Cardiac Rehab from 09/11/2015 in [facility] Cardiac and Pulmonary Rehab   Date  09/02/15   Educator  SB   Instruction Review Code  2- meets goals/outcomes      Cardiac Procedures: - Group verbal and written instruction and models to describe the testing methods done to diagnose heart disease. Reviews the outcomes of the test results. Describes the treatment choices: Medical Management, Angioplasty, or Coronary Bypass Surgery.      Cardiac Rehab from 09/11/2015 in [facility] Cardiac and Pulmonary Rehab   Date  09/09/15   Educator  SB   Instruction Review Code  2- meets goals/outcomes      Cardiac Medications: - Group verbal and written instruction to review commonly prescribed medications for heart disease. Reviews the medication, class of the drug, and side effects. Includes the steps to properly store meds and maintain the prescription regimen.      Cardiac Rehab from 09/11/2015 in [facility] Cardiac and Pulmonary Rehab   Date  09/11/15   Educator  SB   Instruction Review Code  2- meets goals/outcomes      Go Sex-Intimacy & Heart Disease, Get SMART - Goal  Setting: - Group verbal and written instruction through game format to discuss heart disease and the return to sexual intimacy. Provides group verbal and written material to discuss and apply goal setting through the application of the S.M.A.R.T. Method.      Cardiac Rehab from 09/11/2015 in [facility] Cardiac and Pulmonary Rehab   Date  09/09/15   Educator  SB   Instruction Review Code  2- meets goals/outcomes      Other Matters of the Heart: - Provides group verbal, written materials and models to describe Heart Failure, Angina, Valve Disease, and Diabetes in the realm of heart disease. Includes description of the disease process and treatment options available to the cardiac patient.   Exercise & Equipment Safety: - Individual verbal instruction and demonstration of equipment use and safety with use of the equipment.      Cardiac Rehab from 09/11/2015 in [facility] Cardiac and Pulmonary Rehab   Date  08/15/15   Educator  C. Harris   Instruction Review Code  1- partially meets, needs review/practice  Infection Prevention: - Provides verbal and written material to individual with discussion of infection control including proper hand washing and proper equipment cleaning during exercise session.      Cardiac Rehab from 09/11/2015 in [facility] Cardiac and Pulmonary Rehab   Date  08/15/15   Educator  C. EnterkinRN   Instruction Review Code  2- meets goals/outcomes      Falls Prevention: - Provides verbal and written material to individual with discussion of falls prevention and safety.      Cardiac Rehab from 09/11/2015 in [facility] Cardiac and Pulmonary Rehab   Date  08/15/15   Educator  C. Enterkin   Instruction Review Code  2- meets goals/outcomes      Diabetes: - Individual verbal and written instruction to review signs/symptoms of diabetes, desired ranges of glucose level fasting, after meals and with exercise. Advice that pre and post exercise glucose checks will be done for 3 sessions at  entry of program.    Knowledge Questionnaire Score:     Knowledge Questionnaire Score - 08/15/15 1413    Knowledge Questionnaire Score   Pre Score 23      Personal Goals and Risk Factors at Admission:     Personal Goals and Risk Factors at Admission - 08/15/15 1417    Core Components/Risk Factors/Patient Goals on Admission   Tobacco Cessation Yes   Expected Outcomes Long Term: Complete abstinence from all tobacco products for at least 12 months from quit date.  Jeffrey Spence quit smoking his one pack per day for 34 years on Jul 22, 2015. Jeffrey Spence said his triggers are driving which he isn't able to drive until August 22, 2015.    Lipids Yes   Intervention Provide education and support for participant on nutrition & aerobic/resistive exercise along with prescribed medications to achieve LDL <85m, HDL >423m   Expected Outcomes Short Term: Participant states understanding of desired cholesterol values and is compliant with medications prescribed. Participant is following exercise prescription and nutrition guidelines.;Long Term: Cholesterol controlled with medications as prescribed, with individualized exercise RX and with personalized nutrition plan. Value goals: LDL < 7031mHDL > 40 mg.      Personal Goals and Risk Factors Review:      Goals and Risk Factor Review      09/12/15 1330           Core Components/Risk Factors/Patient Goals Review   Personal Goals Review Hypertension       Review Review of chart shows that Jeffrey Spence blood pressure control.  He has lost 2 pounds since starting the program       Expected Outcomes Continued maintanane of blood pressure.           Personal Goals Discharge (Final Personal Goals and Risk Factors Review):      Goals and Risk Factor Review - 09/12/15 1330    Core Components/Risk Factors/Patient Goals Review   Personal Goals Review Hypertension   Review Review of chart shows that Jeffrey Spence blood pressure control.  He has lost 2 pounds since  starting the program   Expected Outcomes Continued maintanane of blood pressure.       ITP Comments:     ITP Comments      08/15/15 1414 08/21/15 0717 09/12/15 1329       ITP Comments Jeffrey Spence he feels his sternum pain sometimes especially when he coughs or sneezes but just sitting in a chair he has no pain. Jeffrey Spence able to drive [place] 2, [id number]  quit smoking July 22, 2015.  30 day review   Cotinue with ITP      Has attended orientation, expecting to start sessions soon 30 Day Review. Continue with the ITP.        Comments:

## 2015-09-13 ENCOUNTER — Encounter: Payer: 59 | Admitting: *Deleted

## 2015-09-13 DIAGNOSIS — Z951 Presence of aortocoronary bypass graft: Secondary | ICD-10-CM

## 2015-09-13 NOTE — Progress Notes (Signed)
Daily Session Note  Patient Details  Name: Jeffrey Spence MRN: [id number] Date of Birth: 1961-05-02 Referring Provider:  Skeet Latch, MD  Encounter Date: 09/13/2015  Check In:     Session Check In - 09/13/15 0900    Check-In   Location [facility]-Cardiac & Pulmonary Rehab   Staff Present Heath Lark, RN, BSN, CCRP;Yadier Bramhall, RN, Michaela Corner, RRT, RCP, Respiratory Therapist   Supervising physician immediately available to respond to emergencies See telemetry face sheet for immediately available ER MD   Medication changes reported     No   Fall or balance concerns reported    No   Warm-up and Cool-down Performed on first and last piece of equipment   Resistance Training Performed Yes   VAD Patient? No   Pain Assessment   Currently in Pain? No/denies         Goals Met:  Proper associated with RPD/PD & O2 Sat Exercise tolerated well  Goals Unmet:  Not Applicable  Comments:    Dr. Emily Filbert is Medical Director for [place] and [facility].

## 2015-09-16 ENCOUNTER — Encounter: Payer: 59 | Admitting: *Deleted

## 2015-09-16 ENCOUNTER — Telehealth: Payer: Self-pay | Admitting: Cardiovascular Disease

## 2015-09-16 DIAGNOSIS — Z951 Presence of aortocoronary bypass graft: Secondary | ICD-10-CM | POA: Diagnosis not present

## 2015-09-16 NOTE — Telephone Encounter (Signed)
Received records request [employer] Short [employer] Disability, forwarded to [facility] for processing.

## 2015-09-16 NOTE — Progress Notes (Signed)
Daily Session Note  Patient Details  Name: Jeffrey Spence MRN: [id number] Date of Birth: [birth year] Referring Provider:  Skeet Latch, MD  Encounter Date: 09/16/2015  Check In:     Session Check In - 09/16/15 0822    Check-In   Location [facility]   Staff Present Candiss Norse, MS, ACSM CEP, Exercise Physiologist;Hasnain Manheim Amedeo Plenty, BS, ACSM CEP, Exercise Physiologist;Susanne Bice, RN, BSN, CCRP   Supervising physician immediately available to respond to emergencies See telemetry face sheet for immediately available ER MD   Medication changes reported     No   Fall or balance concerns reported    No   Warm-up and Cool-down Performed on first and last piece of equipment   Resistance Training Performed Yes   VAD Patient? No   Pain Assessment   Currently in Pain? No/denies   Multiple Pain Sites No         Goals Met:  Independence with exercise equipment Exercise tolerated well No report of cardiac concerns or symptoms Strength training completed today  Goals Unmet:  Not Applicable  Comments: Patient completed exercise prescription and all exercise goals during rehab session. The exercise was tolerated well and the patient is progressing in the program.     Dr. Emily Filbert is Medical Director for [place] and [facility].

## 2015-09-18 DIAGNOSIS — Z951 Presence of aortocoronary bypass graft: Secondary | ICD-10-CM

## 2015-09-18 NOTE — Progress Notes (Signed)
Daily Session Note  Patient Details  Name: Jeffrey Spence MRN: [id number] Date of Birth: 02-06-1961 Referring Provider:  Skeet Latch, MD  Encounter Date: 09/18/2015  Check In:     Session Check In - 09/18/15 0827    Check-In   Location [facility]   Staff Present Heath Lark, RN, BSN, CCRP;Renee Dillard [place], MS, ACSM CEP, Exercise Physiologist;Ermal Brzozowski, BS, ACSM EP-C, Exercise Physiologist   Supervising physician immediately available to respond to emergencies See telemetry face sheet for immediately available ER MD   Medication changes reported     No   Fall or balance concerns reported    No   Warm-up and Cool-down Performed on first and last piece of equipment   Resistance Training Performed No   VAD Patient? No   Pain Assessment   Currently in Pain? No/denies         Goals Met:  Proper associated with RPD/PD & O2 Sat Exercise tolerated well No report of cardiac concerns or symptoms Strength training completed today  Goals Unmet:  Not Applicable  Comments:    Dr. Emily Filbert is Medical Director for [place] and [facility].

## 2015-09-20 ENCOUNTER — Encounter: Payer: 59 | Admitting: *Deleted

## 2015-09-20 DIAGNOSIS — Z951 Presence of aortocoronary bypass graft: Secondary | ICD-10-CM | POA: Diagnosis not present

## 2015-09-20 NOTE — Progress Notes (Signed)
Daily Session Note  Patient Details  Name: Jeffrey Spence MRN: [id number] Date of Birth: February 19, 1961 Referring Provider:  Skeet Latch, MD  Encounter Date: 09/20/2015  Check In:     Session Check In - 09/20/15 0838    Check-In   Location [facility]   Staff Present Candiss Norse, MS, ACSM CEP, Exercise Physiologist;Carroll Enterkin, RN, BSN;Susanne Bice, RN, BSN, CCRP   Supervising physician immediately available to respond to emergencies See telemetry face sheet for immediately available ER MD   Medication changes reported     No   Fall or balance concerns reported    No   Warm-up and Cool-down Performed on first and last piece of equipment   Resistance Training Performed Yes   VAD Patient? No   Pain Assessment   Currently in Pain? No/denies   Multiple Pain Sites No         Goals Met:  Independence with exercise equipment Exercise tolerated well No report of cardiac concerns or symptoms Strength training completed today  Goals Unmet:  Not Applicable  Comments: Patient completed exercise prescription and all exercise goals during rehab session. The exercise was tolerated well and the patient is progressing in the program.    Dr. Emily Filbert is Medical Director for [place] and [facility].

## 2015-09-23 ENCOUNTER — Encounter: Payer: 59 | Attending: Cardiovascular Disease | Admitting: *Deleted

## 2015-09-23 DIAGNOSIS — Z951 Presence of aortocoronary bypass graft: Secondary | ICD-10-CM | POA: Diagnosis present

## 2015-09-23 NOTE — Progress Notes (Signed)
Daily Session Note  Patient Details  Name: Jeffrey Spence MRN: [id number] Date of Birth: 08/18/1960 Referring Provider:  [place], Tiffany, MD  Encounter Date: 09/23/2015  Check In:     Session Check In - 09/23/15 0814    Check-In   Staff Present Kelly Hayes, BS, ACSM CEP, Exercise Physiologist;Mary Jo Abernethy, RN;Steven Way, BS, ACSM EP-C, Exercise Physiologist   Supervising physician immediately available to respond to emergencies See telemetry face sheet for immediately available ER MD   Medication changes reported     No   Fall or balance concerns reported    No   Warm-up and Cool-down Performed on first and last piece of equipment   Resistance Training Performed Yes   VAD Patient? No   Pain Assessment   Currently in Pain? No/denies   Multiple Pain Sites No         Goals Met:  Independence with exercise equipment Exercise tolerated well No report of cardiac concerns or symptoms Strength training completed today  Goals Unmet:  Not Applicable  Comments: Patient completed exercise prescription and all exercise goals during rehab session. The exercise was tolerated well and the patient is progressing in the program.     Dr. Llewelyn Miller is Medical Director for [facility] and [facility]. 

## 2015-09-25 ENCOUNTER — Encounter: Payer: 59 | Admitting: *Deleted

## 2015-09-25 DIAGNOSIS — Z951 Presence of aortocoronary bypass graft: Secondary | ICD-10-CM | POA: Diagnosis not present

## 2015-09-25 NOTE — Progress Notes (Signed)
Cardiac Individual Treatment Plan  Patient Details  Name: Jeffrey Spence MRN: [id number] Date of Birth: 01-14-1961 Referring Provider:  Skeet Latch, MD  Initial Encounter Date:       Cardiac Rehab from 08/15/2015 in [facility] ([place] Cardiac and Pulmonary Rehab   Date  08/15/15      Visit Diagnosis: S/P CABG x 3  Patient's Home Medications on Admission:  Current outpatient prescriptions:  .  amiodarone (PACERONE) 200 MG tablet, Take 1 tablet (200 mg total) by mouth daily., Disp: 30 tablet, Rfl: 6 .  aspirin EC 325 MG EC tablet, Take 1 tablet (325 mg total) by mouth daily., Disp: 30 tablet, Rfl: 0 .  atorvastatin (LIPITOR) 80 MG tablet, Take 1 tablet (80 mg total) by mouth daily at 6 PM., Disp: 30 tablet, Rfl: 3 .  metoprolol tartrate (LOPRESSOR) 25 MG tablet, Take 0.5 tablets (12.5 mg total) by mouth 2 (two) times daily., Disp: 60 tablet, Rfl: 3  Past Medical History: Past Medical History  Diagnosis Date  . Arthritis   . CAD (coronary artery disease)     a. nstemi 1.2017; b. cardiac cath 06/2015 with LM and severe 3 veseel dz (full report pending)  . HLD (hyperlipidemia)   . Tobacco abuse   . NSTEMI (non-ST elevated myocardial infarction) ([place]) 06/2015    Tobacco Use: History  Smoking status  . Former Smoker -- 1.00 packs/day for 30 years  . Types: Cigarettes  . Quit date: 07/17/2015  Smokeless tobacco  . Never Used    Labs: Recent Review Flowsheet Data    Labs for ITP Cardiac and Pulmonary Rehab Latest Ref Rng 07/25/2015 07/25/2015 07/25/2015 07/25/2015 07/26/2015   PHART 7.350 - 7.450 7.341(L) 7.333(L) 7.358 - -   PCO2ART 35.0 - 45.0 mmHg 39.6 33.9(L) 36.7 - -   HCO3 20.0 - 24.0 mEq/L 21.6 17.9(L) 20.5 - -   TCO2 0 - 100 mmol/L _0 ACIDBASEDEF 0.0 - 2.0 mmol/L 4.0(H) 7.0(H) 4.0(H) - -   O2SAT - 96.0 97.0 97.0 - -       Exercise Target Goals:    Exercise Program Goal: Individual exercise prescription set with THRR, safety & activity barriers. Participant  demonstrates ability to understand and report RPE using BORG scale, to self-measure pulse accurately, and to acknowledge the importance of the exercise prescription.  Exercise Prescription Goal: Starting with aerobic activity 30 plus minutes a day, 3 days per week for initial exercise prescription. Provide home exercise prescription and guidelines that participant acknowledges understanding prior to discharge.  Activity Barriers & Risk Stratification:     Activity Barriers & Cardiac Risk Stratification - 08/15/15 1258    Activity Barriers & Cardiac Risk Stratification   Activity Barriers Other (comment)  "sternum from CABG "WEight restirction of 10 lbs   Cardiac Risk Stratification High      6 Minute Walk:     6 Minute Walk      08/15/15 1409       6 Minute Walk   Phase Initial     Distance 1525 feet     Walk Time 6 minutes     # of Rest Breaks 0     Symptoms No     Resting HR 61 bpm     Resting BP 128/60 mmHg     Max Ex. HR 90 bpm     Max Ex. BP 128/64 mmHg        Initial Exercise Prescription:     Initial Exercise  Prescription - 08/15/15 1400    Date of Initial Exercise Prescription   Date 08/15/15   Treadmill   MPH 2.5   Grade 0   Minutes 10   Recumbant Bike   Level 2   RPM 40   Watts 20   Minutes 10   NuStep   Level 2   Watts 40   Minutes 10   Arm Ergometer   Level 1   Watts 10   Minutes 10   Recumbant Elliptical   Level 2   RPM 40   Watts 20   Minutes 10   REL-XR   Level 2   Watts 40   Minutes 10   T5 Nustep   Level 1   Watts 15   Minutes 10   Biostep-RELP   Level 2   Watts 40   Minutes 10   Prescription Details   Frequency (times per week) 3   Duration Progress to 30 minutes of continuous aerobic without signs/symptoms of physical distress   Intensity   THRR REST +  30   Ratings of Perceived Exertion 11-15   Perceived Dyspnea 2-4   Progression   Progression Continue progressive overload as per policy without signs/symptoms or  physical distress.   Resistance Training   Training Prescription Yes   Weight 1   Reps 10-15      Perform Capillary Blood Glucose checks as needed.  Exercise Prescription Changes:     Exercise Prescription Changes      08/30/15 0900 09/02/15 1200 09/06/15 0800 09/11/15 0800     Exercise Review   Progression Yes Yes Yes Yes    Response to Exercise   Blood Pressure (Admit)  112/76 mmHg 112/76 mmHg     Blood Pressure (Exercise)  154/70 mmHg 154/70 mmHg     Blood Pressure (Exit)  114/76 mmHg 114/76 mmHg     Heart Rate (Admit)  73 bpm 73 bpm     Heart Rate (Exercise)  105 bpm 105 bpm     Heart Rate (Exit)  71 bpm 71 bpm     Rating of Perceived Exertion (Exercise)  12 12     Symptoms None None None None    Comments Reviewed individualized exercise prescription and made increases per departmental policy. Exercise increases were discussed with the patient and they were able to perform the new work loads without issue (no signs or symptoms).  Started Jeffrey Spence on jogging intervals and he did very well and wants to eventually jog continuously and train for a 5K. We will steadily increase his jogging intervals in order to achieve this goal.  Extended jogging intervals to 2 minutes followed by 3-4 minutes at moderate pace. Jeffrey Spence is making great progress on the TM work and does well independently adjusting his intervals.      Duration Progress to 45 minutes of aerobic exercise without signs/symptoms of physical distress Progress to 45 minutes of aerobic exercise without signs/symptoms of physical distress Progress to 45 minutes of aerobic exercise without signs/symptoms of physical distress Progress to 45 minutes of aerobic exercise without signs/symptoms of physical distress    Intensity Rest + 30 Rest + 30 Rest + 30 Rest + 30    Progression   Progression Continue progressive overload as per policy without signs/symptoms or physical distress. Continue progressive overload as per policy without  signs/symptoms or physical distress. Continue progressive overload as per policy without signs/symptoms or physical distress. Continue progressive overload as per policy without signs/symptoms or physical  distress.    Resistance Training   Training Prescription Yes Yes Yes Yes    Weight '3 3 3 3    '$ Reps 10-15 10-15 10-15 10-15    Interval Training   Interval Training  Yes Yes Yes    Equipment  Treadmill Treadmill Treadmill    Comments  3 min walking at 3.2 mph followed by 1 min of jogging at 4.5 mph, repeat for 25 min 3-4 min walking at 3.2 mph followed by 2 min of jogging at 4.5 mph, repeat for 25 min 3-4 min walking at 3.2 mph followed by 2 min of jogging at 4.5 mph, repeat for 25 min    Treadmill   MPH 3.5 4.5 4.5 4.5    Grade 4 0 0 0    Minutes '10 30 30 30    '$ Recumbant Bike   Level '2 2 2 2    '$ RPM 40 40 40 40    Watts '20 20 20 20    '$ Minutes '10 10 10 10    '$ NuStep   Level '2 2 2 2    '$ Watts 40 40 40 40    Minutes '10 10 10 10    '$ Arm Ergometer   Level '1 1 1 1    '$ Watts '10 10 10 10    '$ Minutes '10 10 10 10    '$ Recumbant Elliptical   Level '2 2 2 2    '$ RPM 40 40 40 40    Watts '20 20 20 20    '$ Minutes '10 10 10 10    '$ Elliptical   Level '2 2 2 4    '$ Speed 4.5 4.5 4.5 5    Minutes '10 10 10 6    '$ REL-XR   Level '2 3 3 3    '$ Watts 40 40 40 40    Minutes '10 10 10 10    '$ T5 Nustep   Level '1 1 1 1    '$ Watts '15 15 15 15    '$ Minutes '10 10 10 10    '$ Biostep-RELP   Level '2 2 2 2    '$ Watts 40 40 40 40    Minutes '10 10 10 10       '$ Exercise Comments:     Exercise Comments      09/13/15 0901           Exercise Comments Jeffrey Spence said he was running on the pavement outside yesterday and his legs hurt some last night. He said he has good tennis shoes. He said this happens sometimes when he is running on pavement but he sayd he does well on the treadmill in here.           Discharge Exercise Prescription (Final Exercise Prescription Changes):     Exercise Prescription Changes - 09/11/15 0800     Exercise Review   Progression Yes   Response to Exercise   Symptoms None   Duration Progress to 45 minutes of aerobic exercise without signs/symptoms of physical distress   Intensity Rest + 30   Progression   Progression Continue progressive overload as per policy without signs/symptoms or physical distress.   Resistance Training   Training Prescription Yes   Weight 3   Reps 10-15   Interval Training   Interval Training Yes   Equipment Treadmill   Comments 3-4 min walking at 3.2 mph followed by 2 min of jogging at 4.5 mph, repeat for 25 min   Treadmill   MPH 4.5  Grade 0   Minutes 30   Recumbant Bike   Level 2   RPM 40   Watts 20   Minutes 10   NuStep   Level 2   Watts 40   Minutes 10   Arm Ergometer   Level 1   Watts 10   Minutes 10   Recumbant Elliptical   Level 2   RPM 40   Watts 20   Minutes 10   Elliptical   Level 4   Speed 5   Minutes 6   REL-XR   Level 3   Watts 40   Minutes 10   T5 Nustep   Level 1   Watts 15   Minutes 10   Biostep-RELP   Level 2   Watts 40   Minutes 10      Nutrition:  Target Goals: Understanding of nutrition guidelines, daily intake of sodium '1500mg'$ , cholesterol '200mg'$ , calories 30% from fat and 7% or less from saturated fats, daily to have 5 or more servings of fruits and vegetables.  Biometrics:     Pre Biometrics - 08/15/15 1411    Pre Biometrics   Height 5' 8.4" (1.737 m)   Weight 164 lb 6.4 oz (74.571 kg)   Waist Circumference 38.5 inches   Hip Circumference 39 inches   Waist to Hip Ratio 0.99 %   BMI (Calculated) 24.8       Nutrition Therapy Plan and Nutrition Goals:     Nutrition Therapy & Goals - 08/29/15 1207    Nutrition Therapy   Diet Instructed on a meal plan based on 1800 calories including DASH diet principles.   Drug/Food Interactions Statins/Certain Fruits   Protein (specify units) 8   Fiber 30 grams   Whole Grain Foods 3 servings   Saturated Fats 12 max. grams   Fruits and Vegetables 5  servings/day   Sodium 1500 grams   Personal Nutrition Goals   Personal Goal #1 Increase fruits/vegetables with a goal of 5 servings per day.   Personal Goal #2 Try very low sodium tuna.   Personal Goal #3 When eating out, request that no salt be added.   Intervention Plan   Intervention Prescribe, educate and counsel regarding individualized specific dietary modifications aiming towards targeted core components such as weight, hypertension, lipid management, diabetes, heart failure and other comorbidities.;Nutrition handout(s) given to patient.   Expected Outcomes Short Term Goal: Understand basic principles of dietary content, such as calories, fat, sodium, cholesterol and nutrients.;Short Term Goal: A plan has been developed with personal nutrition goals set during dietitian appointment.;Long Term Goal: Adherence to prescribed nutrition plan.      Nutrition Discharge: Rate Your Plate Scores:     Nutrition Assessments - 09/16/15 1133    Rate Your Plate Scores   Pre Score 69   Pre Score % 77 %      Nutrition Goals Re-Evaluation:   Psychosocial: Target Goals: Acknowledge presence or absence of depression, maximize coping skills, provide positive support system. Participant is able to verbalize types and ability to use techniques and skills needed for reducing stress and depression.  Initial Review & Psychosocial Screening:     Initial Psych Review & Screening - 08/15/15 1418    Initial Review   Current issues with Current Sleep Concerns   Family Dynamics   Good Support System? Yes   Barriers   Psychosocial barriers to participate in program The patient should benefit from training in stress management and relaxation.   Screening Interventions  Interventions Encouraged to exercise      Quality of Life Scores:     Quality of Life - 08/15/15 1432    Quality of Life Scores   Health/Function Pre 13.71 %   Socioeconomic Pre 15.59 %   Psych/Spiritual Pre 15.71 %   Family  Pre 19.2 %   GLOBAL Pre 15.3 %      PHQ-9:     Recent Review Flowsheet Data    Depression screen [facility] 2/9 08/15/2015   Decreased Interest 0   Down, Depressed, Hopeless 1   PHQ - 2 Score 1   Altered sleeping 3   Tired, decreased energy 1   Change in appetite 1   Feeling bad or failure about yourself  0   Trouble concentrating 0   Moving slowly or fidgety/restless 0   Suicidal thoughts 0   PHQ-9 Score 6   Difficult doing work/chores Somewhat difficult      Psychosocial Evaluation and Intervention:     Psychosocial Evaluation - 08/26/15 0949    Psychosocial Evaluation & Interventions   Interventions Encouraged to exercise with the program and follow exercise prescription;Relaxation education;Stress management education   Comments Counselor met with [name] today for initial psychosocial evaluation.  He is a 55 year old who had triple bypass approximately one month ago.  He has a strong support system with a spouse of 7 years and (7) children several who are adults.  He also has his mother and father-in-law close by and is actively involved in his local faith community.  [name] states that he is sleeping somewhat better since the surgery and has a goos appetite.  He admits to a history of undiagnosed/untreated depression as well as some current symptoms but his current mood is optimistic.  [name] states that he is currently unable to work, but job stress is something he has experienced a great deal of, as well as his health currently is a stressor.  He has goals to be able to run on the treadmill before he completes this program in order to be able to run outside upon discharge.  He also enjoys walking outside.  Counselor recommended monitoring [name] to see if his mood overall is improved with consistent exercise and education in this program.  If not, Counselor will recommend a therapist or a medication evaluation at that time.     Continued Psychosocial Services Needed Yes   [name] will benefit from the psychoeducational components of this program, especially stress management and depression, as well as consistent exercise.        Psychosocial Re-Evaluation:     Psychosocial Re-Evaluation      09/16/15 0955           Psychosocial Re-Evaluation   Comments Follow up with [name] reporting he is enjoying this program and the benefits of exercise in his life.  However, he continues to report that his mood has not improved much.  He agreed to speak with his doctor about this when he sees him on 4/12, and counselor will continue to follow with him about this.          Vocational Rehabilitation: Provide vocational rehab assistance to qualifying candidates.   Vocational Rehab Evaluation & Intervention:     Vocational Rehab - 08/15/15 1300    Initial Vocational Rehab Evaluation & Intervention   Assessment shows need for Vocational Rehabilitation No      Education: Education Goals: Education classes will be provided on a  weekly basis, covering required topics. Participant will state understanding/return demonstration of topics presented.  Learning Barriers/Preferences:     Learning Barriers/Preferences - 08/15/15 1412    Learning Barriers/Preferences   Learning Barriers None   Learning Preferences None      Education Topics: General Nutrition Guidelines/Fats and Fiber: -Group instruction provided by verbal, written material, models and posters to present the general guidelines for heart healthy nutrition. Gives an explanation and review of dietary fats and fiber.          Cardiac Rehab from 09/25/2015 in [facility] Cardiac and Pulmonary Rehab   Date  09/23/15   Educator  CR   Instruction Review Code  2- meets goals/outcomes      Controlling Sodium/Reading Food Labels: -Group verbal and written material supporting the discussion of sodium use in heart healthy nutrition. Review and explanation with models, verbal and written materials for  utilization of the food label.   Exercise Physiology & Risk Factors: - Group verbal and written instruction with models to review the exercise physiology of the cardiovascular system and associated critical values. Details cardiovascular disease risk factors and the goals associated with each risk factor.   Aerobic Exercise & Resistance Training: - Gives group verbal and written discussion on the health impact of inactivity. On the components of aerobic and resistive training programs and the benefits of this training and how to safely progress through these programs.   Flexibility, Balance, General Exercise Guidelines: - Provides group verbal and written instruction on the benefits of flexibility and balance training programs. Provides general exercise guidelines with specific guidelines to those with heart or lung disease. Demonstration and skill practice provided.      Cardiac Rehab from 09/25/2015 in [facility] Cardiac and Pulmonary Rehab   Date  08/26/15   Educator  [facility]   Instruction Review Code  2- meets goals/outcomes      Stress Management: - Provides group verbal and written instruction about the health risks of elevated stress, cause of high stress, and healthy ways to reduce stress.      Cardiac Rehab from 09/25/2015 in [facility] Cardiac and Pulmonary Rehab   Date  08/28/15   Educator  [facility]   Instruction Review Code  2- meets goals/outcomes      Depression: - Provides group verbal and written instruction on the correlation between heart/lung disease and depressed mood, treatment options, and the stigmas associated with seeking treatment.      Cardiac Rehab from 09/25/2015 in [facility] Cardiac and Pulmonary Rehab   Date  09/25/15   Educator  Elissa Hefty   Instruction Review Code  2- meets goals/outcomes      Anatomy & Physiology of the Heart: - Group verbal and written instruction and models provide basic cardiac anatomy and physiology, with the coronary electrical and arterial systems. Review  of: AMI, Angina, Valve disease, Heart Failure, Cardiac Arrhythmia, Pacemakers, and the ICD.      Cardiac Rehab from 09/25/2015 in [facility] Cardiac and Pulmonary Rehab   Date  09/02/15   Educator  SB   Instruction Review Code  2- meets goals/outcomes      Cardiac Procedures: - Group verbal and written instruction and models to describe the testing methods done to diagnose heart disease. Reviews the outcomes of the test results. Describes the treatment choices: Medical Management, Angioplasty, or Coronary Bypass Surgery.      Cardiac Rehab from 09/25/2015 in [facility] Cardiac and Pulmonary Rehab   Date  09/09/15   Educator  SB  Instruction Review Code  2- meets goals/outcomes      Cardiac Medications: - Group verbal and written instruction to review commonly prescribed medications for heart disease. Reviews the medication, class of the drug, and side effects. Includes the steps to properly store meds and maintain the prescription regimen.      Cardiac Rehab from 09/25/2015 in [facility] Cardiac and Pulmonary Rehab   Date  09/11/15   Educator  SB   Instruction Review Code  2- meets goals/outcomes      Go Sex-Intimacy & Heart Disease, Get SMART - Goal Setting: - Group verbal and written instruction through game format to discuss heart disease and the return to sexual intimacy. Provides group verbal and written material to discuss and apply goal setting through the application of the S.M.A.R.T. Method.      Cardiac Rehab from 09/25/2015 in [facility] Cardiac and Pulmonary Rehab   Date  09/09/15   Educator  SB   Instruction Review Code  2- meets goals/outcomes      Other Matters of the Heart: - Provides group verbal, written materials and models to describe Heart Failure, Angina, Valve Disease, and Diabetes in the realm of heart disease. Includes description of the disease process and treatment options available to the cardiac patient.   Exercise & Equipment Safety: - Individual verbal instruction and  demonstration of equipment use and safety with use of the equipment.      Cardiac Rehab from 09/25/2015 in [facility] Cardiac and Pulmonary Rehab   Date  08/15/15   Educator  C. EnterkinRN   Instruction Review Code  1- partially meets, needs review/practice      Infection Prevention: - Provides verbal and written material to individual with discussion of infection control including proper hand washing and proper equipment cleaning during exercise session.      Cardiac Rehab from 09/25/2015 in [facility] Cardiac and Pulmonary Rehab   Date  08/15/15   Educator  C. EnterkinRN   Instruction Review Code  2- meets goals/outcomes      Falls Prevention: - Provides verbal and written material to individual with discussion of falls prevention and safety.      Cardiac Rehab from 09/25/2015 in [facility] Cardiac and Pulmonary Rehab   Date  08/15/15   Educator  C. Teretha Chalupa   Instruction Review Code  2- meets goals/outcomes      Diabetes: - Individual verbal and written instruction to review signs/symptoms of diabetes, desired ranges of glucose level fasting, after meals and with exercise. Advice that pre and post exercise glucose checks will be done for 3 sessions at entry of program.    Knowledge Questionnaire Score:     Knowledge Questionnaire Score - 08/15/15 1413    Knowledge Questionnaire Score   Pre Score 23      Core Components/Risk Factors/Patient Goals at Admission:     Personal Goals and Risk Factors at Admission - 08/15/15 1417    Core Components/Risk Factors/Patient Goals on Admission   Tobacco Cessation Yes   Expected Outcomes Long Term: Complete abstinence from all tobacco products for at least 12 months from quit date.  Peregrine quit smoking his one pack per day for 34 years on Jul 22, 2015. Nikoloz said his triggers are driving which he isn't able to drive until August 22, 2015.    Lipids Yes   Intervention Provide education and support for participant on nutrition & aerobic/resistive exercise  along with prescribed medications to achieve LDL '70mg'$ , HDL >'40mg'$ .   Expected Outcomes Short  Term: Participant states understanding of desired cholesterol values and is compliant with medications prescribed. Participant is following exercise prescription and nutrition guidelines.;Long Term: Cholesterol controlled with medications as prescribed, with individualized exercise RX and with personalized nutrition plan. Value goals: LDL < '70mg'$ , HDL > 40 mg.      Core Components/Risk Factors/Patient Goals Review:      Goals and Risk Factor Review      09/12/15 1330           Core Components/Risk Factors/Patient Goals Review   Personal Goals Review Hypertension       Review Review of chart shows that Jeffrey Spence has blood pressure control.  He has lost 2 pounds since starting the program       Expected Outcomes Continued maintanane of blood pressure.           Core Components/Risk Factors/Patient Goals at Discharge (Final Review):      Goals and Risk Factor Review - 09/12/15 1330    Core Components/Risk Factors/Patient Goals Review   Personal Goals Review Hypertension   Review Review of chart shows that Jeffrey Spence has blood pressure control.  He has lost 2 pounds since starting the program   Expected Outcomes Continued maintanane of blood pressure.       ITP Comments:     ITP Comments      08/15/15 1414 08/21/15 0717 09/12/15 1329 09/13/15 0903     ITP Comments Jeffrey Spence said he feels his sternum pain sometimes especially when he coughs or sneezes but just sitting in a chair he has no pain. Jeffrey Spence is able to drive August 22, 6787, Jeffrey Spence quit smoking July 22, 2015.  30 day review   Cotinue with ITP      Has attended orientation, expecting to start sessions soon 30 Day Review. Continue with the ITP. Jeffrey Spence said he was running on the pavement outside yesterday and his legs hurt some last night. He said he has good tennis shoes. He said this happens sometimes when he is running on pavement but he sayd he does  well on the treadmill in here.        Comments:

## 2015-09-25 NOTE — Progress Notes (Signed)
Daily Session Note  Patient Details  Name: Jeffrey Spence MRN: [id number] Date of Birth: [birth year] Referring Provider:  Skeet Latch, MD  Encounter Date: 09/25/2015  Check In:     Session Check In - 09/25/15 0827    Check-In   Location [facility]   Staff Present Nyoka Cowden, RN;Allayah Raineri, RN, Moises Blood, BS, ACSM CEP, Exercise Physiologist   Supervising physician immediately available to respond to emergencies See telemetry face sheet for immediately available ER MD   Medication changes reported     No   Fall or balance concerns reported    No   Warm-up and Cool-down Performed on first and last piece of equipment   Resistance Training Performed Yes   VAD Patient? No   Pain Assessment   Currently in Pain? No/denies         Goals Met:  Proper associated with RPD/PD & O2 Sat Exercise tolerated well  Goals Unmet:  Not Applicable  Comments:    Dr. Emily Filbert is Medical Director for [place] and [facility].

## 2015-09-27 ENCOUNTER — Encounter: Payer: 59 | Admitting: *Deleted

## 2015-09-27 DIAGNOSIS — Z951 Presence of aortocoronary bypass graft: Secondary | ICD-10-CM | POA: Diagnosis not present

## 2015-09-27 NOTE — Progress Notes (Signed)
Daily Session Note  Patient Details  Name: Jeffrey Spence MRN: [id number] Date of Birth: 01-18-1961 Referring Provider:  Skeet Latch, MD  Encounter Date: 09/27/2015  Check In:     Session Check In - 09/27/15 1004    Check-In   Location [facility]   Staff Present Heath Lark, RN, BSN, CCRP;Keigan Tafoya, RN, Moises Blood, BS, ACSM CEP, Exercise Physiologist   Supervising physician immediately available to respond to emergencies See telemetry face sheet for immediately available ER MD   Medication changes reported     No   Fall or balance concerns reported    No   Warm-up and Cool-down Performed on first and last piece of equipment   Resistance Training Performed Yes   VAD Patient? No   Pain Assessment   Currently in Pain? No/denies           Exercise Prescription Changes - 09/27/15 1000    Exercise Review   Progression Yes   Response to Exercise   Symptoms None   Duration Progress to 45 minutes of aerobic exercise without signs/symptoms of physical distress   Intensity Rest + 30   Progression   Progression Continue progressive overload as per policy without signs/symptoms or physical distress.   Resistance Training   Training Prescription Yes   Weight 5   Reps 10-15   Interval Training   Interval Training Yes   Equipment Treadmill   Comments 3-4 min walking at 3.2 mph followed by 2 min of jogging at 4.5 mph, repeat for 25 min   Treadmill   MPH 4.5   Grade 0   Minutes 30   Recumbant Bike   Level 2   RPM 40   Watts 20   Minutes 10   NuStep   Level 2   Watts 40   Minutes 10   Arm Ergometer   Level 1   Watts 10   Minutes 10   Recumbant Elliptical   Level 2   RPM 40   Watts 20   Minutes 10   Elliptical   Level 4   Speed 5   Minutes 6   REL-XR   Level 3   Watts 40   Minutes 10   T5 Nustep   Level 1   Watts 15   Minutes 10   Biostep-RELP   Level 2   Watts 40   Minutes 10      Goals Met:  Proper associated  with RPD/PD & O2 Sat Independence with exercise equipment Exercise tolerated well No report of cardiac concerns or symptoms  Goals Unmet:  Not Applicable  Comments:    Dr. Emily Filbert is Medical Director for [place] and [facility].

## 2015-09-30 ENCOUNTER — Encounter: Payer: 59 | Admitting: *Deleted

## 2015-09-30 DIAGNOSIS — Z951 Presence of aortocoronary bypass graft: Secondary | ICD-10-CM

## 2015-09-30 NOTE — Progress Notes (Signed)
Daily Session Note  Patient Details  Name: NAFIS FARNAN MRN: [id number] Date of Birth: 02-22-1961 Referring Provider:  Skeet Latch, MD  Encounter Date: 09/30/2015  Check In:     Session Check In - 09/30/15 0834    Check-In   Location [facility]   Staff Present Nyoka Cowden, RN;Shley Dolby, RN, Moises Blood, BS, ACSM CEP, Exercise Physiologist   Supervising physician immediately available to respond to emergencies See telemetry face sheet for immediately available ER MD   Medication changes reported     No   Fall or balance concerns reported    No   Warm-up and Cool-down Performed on first and last piece of equipment   Resistance Training Performed Yes   VAD Patient? No   Pain Assessment   Currently in Pain? No/denies         Goals Met:  Proper associated with RPD/PD & O2 Sat Exercise tolerated well  Goals Unmet:  Not Applicable  Comments:    Dr. Emily Filbert is Medical Director for [place] and [facility].

## 2015-10-01 ENCOUNTER — Other Ambulatory Visit: Payer: Self-pay | Admitting: Family Medicine

## 2015-10-01 ENCOUNTER — Ambulatory Visit
Admission: RE | Admit: 2015-10-01 | Discharge: 2015-10-01 | Disposition: A | Discharge status: Home / Self Care | Payer: 59 | Source: Ambulatory Visit | Attending: Family Medicine | Admitting: Family Medicine

## 2015-10-01 DIAGNOSIS — M75112 Incomplete rotator cuff tear or rupture of left shoulder, not specified as traumatic: Secondary | ICD-10-CM | POA: Insufficient documentation

## 2015-10-01 DIAGNOSIS — M25512 Pain in left shoulder: Secondary | ICD-10-CM | POA: Diagnosis not present

## 2015-10-01 DIAGNOSIS — Z135 Encounter for screening for eye and ear disorders: Secondary | ICD-10-CM | POA: Insufficient documentation

## 2015-10-01 NOTE — Progress Notes (Signed)
[facility] MRI department called.  They have Jeffrey Spence there for his Shoulder MRI but has a history of metal in his eye.  They are requesting order for Orbital X-ray.  Orders entered in [place].

## 2015-10-02 ENCOUNTER — Encounter: Payer: 59 | Admitting: *Deleted

## 2015-10-02 DIAGNOSIS — Z951 Presence of aortocoronary bypass graft: Secondary | ICD-10-CM | POA: Diagnosis not present

## 2015-10-02 NOTE — Progress Notes (Signed)
Daily Session Note  Patient Details  Name: Jeffrey Spence MRN: [id number] Date of Birth: 05-15-1961 Referring Provider:  Skeet Latch, MD  Encounter Date: 10/02/2015  Check In:     Session Check In - 10/02/15 0850    Check-In   Location [facility]   Staff Present Nyoka Cowden, RN;Eloy Fehl, RN, Alex Gardener, DPT, CEEA   Supervising physician immediately available to respond to emergencies See telemetry face sheet for immediately available ER MD   Medication changes reported     No   Fall or balance concerns reported    No   Warm-up and Cool-down Performed on first and last piece of equipment   Resistance Training Performed Yes   VAD Patient? No   Pain Assessment   Currently in Pain? No/denies         Goals Met:  Proper associated with RPD/PD & O2 Sat Exercise tolerated well  Goals Unmet:  Not Applicable  Comments:    Dr. Emily Filbert is Medical Director for [place] and [facility].

## 2015-10-04 ENCOUNTER — Encounter: Payer: 59 | Admitting: *Deleted

## 2015-10-04 DIAGNOSIS — Z951 Presence of aortocoronary bypass graft: Secondary | ICD-10-CM

## 2015-10-04 NOTE — Progress Notes (Signed)
Daily Session Note  Patient Details  Name: Jeffrey Spence MRN: [id number] Date of Birth: 04/28/1961 Referring Provider:    Encounter Date: 10/04/2015  Check In:     Session Check In - 10/04/15 0912    Check-In   Staff Present Heath Lark, RN, BSN, CCRP;Carroll Enterkin, RN, Alex Gardener, DPT, CEEA   Supervising physician immediately available to respond to emergencies See telemetry face sheet for immediately available ER MD   Medication changes reported     No   Fall or balance concerns reported    No   Warm-up and Cool-down Performed on first and last piece of equipment   VAD Patient? No   Pain Assessment   Currently in Pain? No/denies         Goals Met:  Independence with exercise equipment Exercise tolerated well No report of cardiac concerns or symptoms Strength training completed today  Goals Unmet:  Not Applicable  Comments: Doing well with exercise prescription progression.    Dr. Emily Filbert is Medical Director for [place] and [facility].

## 2015-10-07 ENCOUNTER — Encounter: Payer: Self-pay | Admitting: Cardiovascular Disease

## 2015-10-07 ENCOUNTER — Ambulatory Visit (INDEPENDENT_AMBULATORY_CARE_PROVIDER_SITE_OTHER): Payer: 59 | Admitting: Cardiovascular Disease

## 2015-10-07 VITALS — BP 104/70 | HR 53 | Ht 68.0 in | Wt 166.1 lb

## 2015-10-07 DIAGNOSIS — F17200 Nicotine dependence, unspecified, uncomplicated: Secondary | ICD-10-CM | POA: Diagnosis not present

## 2015-10-07 DIAGNOSIS — E785 Hyperlipidemia, unspecified: Secondary | ICD-10-CM | POA: Insufficient documentation

## 2015-10-07 DIAGNOSIS — I200 Unstable angina: Secondary | ICD-10-CM | POA: Diagnosis not present

## 2015-10-07 DIAGNOSIS — I249 Acute ischemic heart disease, unspecified: Secondary | ICD-10-CM

## 2015-10-07 DIAGNOSIS — Z951 Presence of aortocoronary bypass graft: Secondary | ICD-10-CM | POA: Diagnosis not present

## 2015-10-07 MED ORDER — CLOPIDOGREL BISULFATE 75 MG PO TABS: 75.0000 mg | ORAL_TABLET | Freq: Every day | ORAL | Status: DC

## 2015-10-07 NOTE — Patient Instructions (Addendum)
You are doing well.  Stop the amiodarone (was for atrial fibrillation) Start plavix one a day with aspirin 81 mg daily (blood thinners)  Lab work, lipids and lfts in the next few weeks   Please call us if you have new issues that need to be addressed before your next appt.  Your physician wants you to follow-up in: 6 months.  You will receive a reminder letter in the mail two months in advance. If you don't receive a letter, please call our office to schedule the follow-up appointment.

## 2015-10-07 NOTE — Assessment & Plan Note (Signed)
Currently with no symptoms of angina. No further workup at this time. Continue current medication regimen. Feels ready to go back to work in 2 weeks time after more rehabilitation

## 2015-10-07 NOTE — Assessment & Plan Note (Signed)
We have ordered a lipid panel and LFTs, goal LDL less than 70

## 2015-10-07 NOTE — Progress Notes (Signed)
Patient ID: Jeffrey Spence, male    DOB: 05/24/1961, 55 y.o.   MRN: [id number]  HPI Comments: 55 year old gentleman with long history of smoking,  history of coronary artery disease, bypass surgery 07/25/2015 after non-STEMI, postoperative atrial fibrillation who presents for routine follow-up.   In follow-up, he reports that he is doing well, has completed several weeks of rehabilitation, legs are getting stronger. Still feels like his arms are weak. 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 Would like to go back in approximately 2 weeks after more rehabilitation. Denies significant pain in the sternal area. Denies any leg swelling, shortness of breath on exertion Reports that he stopped smoking following the surgery Denies any lightheadedness or dizziness  Other past medical history reviewed Presented with non-ST elevation MI  troponin was 1.5, non-ST elevation MI.  underwent coronary bypass grafting 3 with bilateral mammaries and a left radial artery on 07/25/2015. Postoperatively he had atrial fibrillation. He converted to sinus rhythm with amiodarone. He did not require anticoagulation.   Cardiac catheterization report 80-90% ostial left main disease that did not improve with nitroglycerin IC 80% proximal LAD disease, long region with moderate calcification 80-90% proximal RCA disease, calcified, ulcerative plaque 70% proximal left circumflex disease Essentially normal LV gram, ejection fraction greater than 55 %      No Known Allergies  Current Outpatient Prescriptions on File Prior to Visit  Medication Sig Dispense Refill  . atorvastatin (LIPITOR) 80 MG tablet Take 1 tablet (80 mg total) by mouth daily at 6 PM. 30 tablet 3  . metoprolol tartrate (LOPRESSOR) 25 MG tablet Take 0.5 tablets (12.5 mg total) by mouth 2 (two) times daily. 60 tablet 3   No current facility-administered medications on  file prior to visit.    Past Medical History  Diagnosis Date  . Arthritis   . CAD (coronary artery disease)     a. nstemi 1.2017; b. cardiac cath 06/2015 with LM and severe 3 veseel dz (full report pending)  . HLD (hyperlipidemia)   . Tobacco abuse   . NSTEMI (non-ST elevated myocardial infarction) ([place]) 06/2015    Past Surgical History  Procedure Laterality Date  . Fractured toe Right 2013    great toe/with pin  . Cardiac catheterization Bilateral 07/23/2015    Procedure: Left Heart Cath and Coronary Angiography;  Surgeon: Minna Merritts, MD;  Location: [place] [facility];  Service: Cardiovascular;  Laterality: Bilateral;  . Coronary artery bypass graft N/A 07/25/2015    Procedure: CORONARY ARTERY BYPASS GRAFTING X 3 UTILIZING BILATERAL IMA AND LEFT RADIAL ARTERY;  Surgeon: Melrose Nakayama, MD;  Location: [place];  Service: Open Heart Surgery;  Laterality: N/A;  . Radial artery harvest Left 07/25/2015    Procedure: RADIAL ARTERY HARVEST;  Surgeon: Melrose Nakayama, MD;  Location: [place];  Service: Open Heart Surgery;  Laterality: Left;  . Tee without cardioversion N/A 07/25/2015    Procedure: TRANSESOPHAGEAL ECHOCARDIOGRAM (TEE);  Surgeon: Melrose Nakayama, MD;  Location: [place];  Service: Open Heart Surgery;  Laterality: N/A;    Social History  reports that he quit smoking about 2 months ago. His smoking use included Cigarettes. He has a 30 pack-year smoking history. He has never used smokeless tobacco. He reports that he drinks alcohol. He reports that he does not use illicit drugs.  Family History family history includes Arthritis in his mother; Cancer in  his father and paternal uncle; Heart attack (age of onset: 33) in his father. There is no history of Colon cancer.   Review of Systems  Respiratory: Negative.   Cardiovascular: Negative.   Gastrointestinal: Negative.   Musculoskeletal: Negative.   Neurological: Positive for weakness.  Hematological: Negative.    Psychiatric/Behavioral: Negative.   All other systems reviewed and are negative.   BP 104/70 mmHg  Pulse 53  Ht 5\' 8"  (1.727 m)  Wt 166 lb 1.9 oz (75.352 kg)  BMI 25.26 kg/m2  SpO2 98%  Physical Exam  Constitutional: He is oriented to person, place, and time. He appears well-developed and well-nourished.  HENT:  Head: Normocephalic.  Nose: Nose normal.  Mouth/Throat: Oropharynx is clear and moist.  Eyes: Conjunctivae are normal. Pupils are equal, round, and reactive to light.  Neck: Normal range of motion. Neck supple. No JVD present.  Cardiovascular: Normal rate, regular rhythm, normal heart sounds and intact distal pulses.  Exam reveals no gallop and no friction rub.   No murmur heard. Pulmonary/Chest: Effort normal and breath sounds normal. No respiratory distress. He has no wheezes. He has no rales. He exhibits no tenderness.  Abdominal: Soft. Bowel sounds are normal. He exhibits no distension. There is no tenderness.  Musculoskeletal: Normal range of motion. He exhibits no edema or tenderness.  Lymphadenopathy:    He has no cervical adenopathy.  Neurological: He is alert and oriented to person, place, and time. Coordination normal.  Skin: Skin is warm and dry. No rash noted. No erythema.  Psychiatric: He has a normal mood and affect. His behavior is normal. Judgment and thought content normal.

## 2015-10-07 NOTE — Assessment & Plan Note (Signed)
Long discussion concerning his anatomy, surgery, recovery, going back to work. Disability paperwork filled out with him.   Total encounter time more than 25 minutes  Greater than 50% was spent in counseling and coordination of care with the patient

## 2015-10-07 NOTE — Assessment & Plan Note (Signed)
Recommended he decrease the aspirin down to 81 mg daily, we will start Plavix given he percent with non-STEMI

## 2015-10-07 NOTE — Assessment & Plan Note (Signed)
We have encouraged him to continue to work on weaning his cigarettes and smoking cessation. He will continue to work on this and does not want any assistance with chantix. He reports he stopped smoking after surgery

## 2015-10-09 ENCOUNTER — Encounter: Payer: 59 | Admitting: *Deleted

## 2015-10-09 DIAGNOSIS — Z951 Presence of aortocoronary bypass graft: Secondary | ICD-10-CM | POA: Diagnosis not present

## 2015-10-09 NOTE — Progress Notes (Signed)
Daily Session Note  Patient Details  Name: Jeffrey Spence MRN: [id number] Date of Birth: 1960/12/21 Referring Provider:    Encounter Date: 10/09/2015  Check In:     Session Check In - 10/09/15 0828    Check-In   Staff Present Heath Lark, RN, BSN, CCRP;Carroll Enterkin, RN, Alex Gardener, DPT, CEEA   Supervising physician immediately available to respond to emergencies See telemetry face sheet for immediately available ER MD   Comments med list up to date   amiodarone stopped. ASA 325 to 81 mg and PLavix started   Fall or balance concerns reported    No   Warm-up and Cool-down Performed on first and last piece of equipment   VAD Patient? No   Pain Assessment   Currently in Pain? No/denies         Goals Met:  Independence with exercise equipment Exercise tolerated well No report of cardiac concerns or symptoms  Goals Unmet:  Not Applicable  Comments: Doing well with exercise prescription progression.    Dr. Emily Filbert is Medical Director for [place] and [facility].

## 2015-10-11 ENCOUNTER — Telehealth: Payer: Self-pay | Admitting: Family Medicine

## 2015-10-11 ENCOUNTER — Encounter: Payer: 59 | Admitting: *Deleted

## 2015-10-11 DIAGNOSIS — Z951 Presence of aortocoronary bypass graft: Secondary | ICD-10-CM | POA: Diagnosis not present

## 2015-10-11 NOTE — Progress Notes (Signed)
Daily Session Note  Patient Details  Name: DEMON VOLANTE MRN: [id number] Date of Birth: [birth year] Referring Provider:    Encounter Date: 10/11/2015  Check In:     Session Check In - 10/11/15 0912    Check-In   Staff Present Heath Lark, RN, BSN, CCRP;Carroll Enterkin, RN, Alex Gardener, DPT, CEEA   Supervising physician immediately available to respond to emergencies See telemetry face sheet for immediately available ER MD   Medication changes reported     No   Fall or balance concerns reported    No   Warm-up and Cool-down Performed on first and last piece of equipment   VAD Patient? No   Pain Assessment   Currently in Pain? No/denies         Goals Met:  Independence with exercise equipment Exercise tolerated well No report of cardiac concerns or symptoms Strength training completed today  Goals Unmet:  Not Applicable  Comments: Doing well with exercise prescription progression.    Dr. Emily Filbert is Medical Director for [place] and [facility].

## 2015-10-11 NOTE — Telephone Encounter (Signed)
See result note from 10/01/15 MRI result.

## 2015-10-11 NOTE — Telephone Encounter (Signed)
Pt states he is returning your call please call back at [phone] Thanks

## 2015-10-13 ENCOUNTER — Other Ambulatory Visit: Payer: Self-pay | Admitting: Family Medicine

## 2015-10-13 ENCOUNTER — Encounter: Payer: Self-pay | Admitting: *Deleted

## 2015-10-13 DIAGNOSIS — M75102 Unspecified rotator cuff tear or rupture of left shoulder, not specified as traumatic: Secondary | ICD-10-CM

## 2015-10-13 DIAGNOSIS — Z951 Presence of aortocoronary bypass graft: Secondary | ICD-10-CM

## 2015-10-13 NOTE — Progress Notes (Signed)
Cardiac Individual Treatment Plan  Patient Details  Name: Jeffrey Spence MRN: [id number] Date of Birth: 16-Feb-1961 Referring Provider:    Initial Encounter Date:       Cardiac Rehab from 08/15/2015 in [facility] Cardiac and Pulmonary Rehab   Date  08/15/15      Visit Diagnosis: S/P CABG x 3  Patient's Home Medications on Admission:  Current outpatient prescriptions:  .  aspirin 81 MG tablet, Take 1 tablet (81 mg total) by mouth daily., Disp: , Rfl:  .  atorvastatin (LIPITOR) 80 MG tablet, Take 1 tablet (80 mg total) by mouth daily at 6 PM., Disp: 30 tablet, Rfl: 3 .  clopidogrel (PLAVIX) 75 MG tablet, Take 1 tablet (75 mg total) by mouth daily., Disp: 90 tablet, Rfl: 3 .  metoprolol tartrate (LOPRESSOR) 25 MG tablet, Take 0.5 tablets (12.5 mg total) by mouth 2 (two) times daily., Disp: 60 tablet, Rfl: 3  Past Medical History: Past Medical History  Diagnosis Date  . Arthritis   . CAD (coronary artery disease)     a. nstemi 1.2017; b. cardiac cath 06/2015 with LM and severe 3 veseel dz (full report pending)  . HLD (hyperlipidemia)   . Tobacco abuse   . NSTEMI (non-ST elevated myocardial infarction) ([place]) 06/2015    Tobacco Use: History  Smoking status  . Former Smoker -- 1.00 packs/day for 30 years  . Types: Cigarettes  . Quit date: 07/17/2015  Smokeless tobacco  . Never Used    Labs: Recent Review Flowsheet Data    Labs for ITP Cardiac and Pulmonary Rehab Latest Ref Rng 07/25/2015 07/25/2015 07/25/2015 07/25/2015 07/26/2015   PHART 7.350 - 7.450 7.341(L) 7.333(L) 7.358 - -   PCO2ART 35.0 - 45.0 mmHg 39.6 33.9(L) 36.7 - -   HCO3 20.0 - 24.0 mEq/L 21.6 17.9(L) 20.5 - -   TCO2 0 - 100 mmol/L 23 19 22 22 22    ACIDBASEDEF 0.0 - 2.0 mmol/L 4.0(H) 7.0(H) 4.0(H) - -   O2SAT - 96.0 97.0 97.0 - -       Exercise Target Goals:    Exercise Program Goal: Individual exercise prescription set with THRR, safety & activity barriers. Participant demonstrates ability to understand and report RPE  using BORG scale, to self-measure pulse accurately, and to acknowledge the importance of the exercise prescription.  Exercise Prescription Goal: Starting with aerobic activity 30 plus minutes a day, 3 days per week for initial exercise prescription. Provide home exercise prescription and guidelines that participant acknowledges understanding prior to discharge.  Activity Barriers & Risk Stratification:     Activity Barriers & Cardiac Risk Stratification - 08/15/15 1258    Activity Barriers & Cardiac Risk Stratification   Activity Barriers Other (comment)  "sternum from CABG "WEight restirction of 10 lbs   Cardiac Risk Stratification High      6 Minute Walk:     6 Minute Walk      08/15/15 1409 10/11/15 0941     6 Minute Walk   Phase Initial Discharge    Distance 1525 feet 2133 feet    Walk Time 6 minutes 6 minutes    # of Rest Breaks 0     RPE  12    Symptoms No     Resting HR 61 bpm 68 bpm    Resting BP 128/60 mmHg 110/70 mmHg    Max Ex. HR 90 bpm 110 bpm    Max Ex. BP 128/64 mmHg 148/66 mmHg       Initial Exercise  Prescription:     Initial Exercise Prescription - 08/15/15 1400    Date of Initial Exercise RX and Referring Provider   Date 08/15/15   Treadmill   MPH 2.5   Grade 0   Minutes 10   Recumbant Bike   Level 2   RPM 40   Watts 20   Minutes 10   NuStep   Level 2   Watts 40   Minutes 10   Arm Ergometer   Level 1   Watts 10   Minutes 10   Recumbant Elliptical   Level 2   RPM 40   Watts 20   Minutes 10   REL-XR   Level 2   Watts 40   Minutes 10   T5 Nustep   Level 1   Watts 15   Minutes 10   Biostep-RELP   Level 2   Watts 40   Minutes 10   Prescription Details   Frequency (times per week) 3   Duration Progress to 30 minutes of continuous aerobic without signs/symptoms of physical distress   Intensity   THRR REST +  30   Ratings of Perceived Exertion 11-15   Perceived Dyspnea 2-4   Progression   Progression Continue progressive  overload as per policy without signs/symptoms or physical distress.   Resistance Training   Training Prescription Yes   Weight 1   Reps 10-15      Perform Capillary Blood Glucose checks as needed.  Exercise Prescription Changes:     Exercise Prescription Changes      08/30/15 0900 09/02/15 1200 09/06/15 0800 09/11/15 0800 09/27/15 1000   Exercise Review   Progression Yes Yes Yes Yes Yes   Response to Exercise   Blood Pressure (Admit)  112/76 mmHg 112/76 mmHg     Blood Pressure (Exercise)  154/70 mmHg 154/70 mmHg     Blood Pressure (Exit)  114/76 mmHg 114/76 mmHg     Heart Rate (Admit)  73 bpm 73 bpm     Heart Rate (Exercise)  105 bpm 105 bpm     Heart Rate (Exit)  71 bpm 71 bpm     Rating of Perceived Exertion (Exercise)  12 12     Symptoms None None None None None   Comments Reviewed individualized exercise prescription and made increases per departmental policy. Exercise increases were discussed with the patient and they were able to perform the new work loads without issue (no signs or symptoms).  Started Jeffrey Spence on jogging intervals and he did very well and wants to eventually jog continuously and train for a 5K. We will steadily increase his jogging intervals in order to achieve this goal.  Extended jogging intervals to 2 minutes followed by 3-4 minutes at moderate pace. Jeffrey Spence is making great progress on the TM work and does well independently adjusting his intervals.      Duration Progress to 45 minutes of aerobic exercise without signs/symptoms of physical distress Progress to 45 minutes of aerobic exercise without signs/symptoms of physical distress Progress to 45 minutes of aerobic exercise without signs/symptoms of physical distress Progress to 45 minutes of aerobic exercise without signs/symptoms of physical distress Progress to 45 minutes of aerobic exercise without signs/symptoms of physical distress   Intensity Rest + 30 Rest + 30 Rest + 30 Rest + 30 Rest + 30   Progression    Progression Continue progressive overload as per policy without signs/symptoms or physical distress. Continue progressive overload as per policy without signs/symptoms or  physical distress. Continue progressive overload as per policy without signs/symptoms or physical distress. Continue progressive overload as per policy without signs/symptoms or physical distress. Continue progressive overload as per policy without signs/symptoms or physical distress.   Resistance Training   Training Prescription Yes Yes Yes Yes Yes   Weight 3 3 3 3 5    Reps 10-15 10-15 10-15 10-15 10-15   Interval Training   Interval Training  Yes Yes Yes Yes   Equipment  Treadmill Treadmill Treadmill Treadmill   Comments  3 min walking at 3.2 mph followed by 1 min of jogging at 4.5 mph, repeat for 25 min 3-4 min walking at 3.2 mph followed by 2 min of jogging at 4.5 mph, repeat for 25 min 3-4 min walking at 3.2 mph followed by 2 min of jogging at 4.5 mph, repeat for 25 min 3-4 min walking at 3.2 mph followed by 2 min of jogging at 4.5 mph, repeat for 25 min   Treadmill   MPH 3.5 4.5 4.5 4.5 4.5   Grade 4 0 0 0 0   Minutes 10 30 30 30 30    Recumbant Bike   Level 2 2 2 2 2    RPM 40 40 40 40 40   Watts 20 20 20 20 20    Minutes 10 10 10 10 10    NuStep   Level 2 2 2 2 2    Watts 40 40 40 40 40   Minutes 10 10 10 10 10    Arm Ergometer   Level 1 1 1 1 1    Watts 10 10 10 10 10    Minutes 10 10 10 10 10    Recumbant Elliptical   Level 2 2 2 2 2    RPM 40 40 40 40 40   Watts 20 20 20 20 20    Minutes 10 10 10 10 10    Elliptical   Level 2 2 2 4 4    Speed 4.5 4.5 4.5 5 5    Minutes 10 10 10 6 6    REL-XR   Level 2 3 3 3 3    Watts 40 40 40 40 40   Minutes 10 10 10 10 10    T5 Nustep   Level 1 1 1 1 1    Watts 15 15 15 15 15    Minutes 10 10 10 10 10    Biostep-RELP   Level 2 2 2 2 2    Watts 40 40 40 40 40   Minutes 10 10 10 10 10      09/30/15 0800           Exercise Review   Progression Yes       Response to Exercise    Blood Pressure (Admit) 106/64 mmHg       Blood Pressure (Exercise) 144/78 mmHg       Blood Pressure (Exit) 126/74 mmHg       Heart Rate (Admit) 72 bpm       Heart Rate (Exercise) 108 bpm       Heart Rate (Exit) 81 bpm       Rating of Perceived Exertion (Exercise) 13       Symptoms none       Duration Progress to 45 minutes of aerobic exercise without signs/symptoms of physical distress       Intensity Rest + 30       Progression   Progression Continue progressive overload as per policy without signs/symptoms or physical distress.       Resistance Training  Training Prescription Yes       Weight 5       Reps 10-15       Interval Training   Interval Training Yes       Equipment Treadmill       Comments 3 min walking at 3.3mh followed by 2 min jogging at4.0 mph, with elevation consistantly at 4%       Treadmill   MPH 4.5  intervals (see above comment)       Grade 4       Minutes 30       Elliptical   Level 4       Speed 5       Minutes 15          Exercise Comments:     Exercise Comments      09/13/15 0901 09/30/15 1309         Exercise Comments MArisaid he was running on the pavement outside yesterday and his legs hurt some last night. He said he has good tennis shoes. He said this happens sometimes when he is running on pavement but he sayd he does well on the treadmill in here.  MDustinhas been working towards 349m/70min intervals. He has just now been able to achieve his high intensity 2 min invertals and wants to continue to work toward consistant running. He has been walking laps in his neighborhood that are up and down hills and we discussed how that was also good interval training.          Discharge Exercise Prescription (Final Exercise Prescription Changes):     Exercise Prescription Changes - 09/30/15 0800    Exercise Review   Progression Yes   Response to Exercise   Blood Pressure (Admit) 106/64 mmHg   Blood Pressure (Exercise) 144/78 mmHg   Blood  Pressure (Exit) 126/74 mmHg   Heart Rate (Admit) 72 bpm   Heart Rate (Exercise) 108 bpm   Heart Rate (Exit) 81 bpm   Rating of Perceived Exertion (Exercise) 13   Symptoms none   Duration Progress to 45 minutes of aerobic exercise without signs/symptoms of physical distress   Intensity Rest + 30   Progression   Progression Continue progressive overload as per policy without signs/symptoms or physical distress.   Resistance Training   Training Prescription Yes   Weight 5   Reps 10-15   Interval Training   Interval Training Yes   Equipment Treadmill   Comments 3 min walking at 3.70m17mfollowed by 2 min jogging at4.0 mph, with elevation consistantly at 4%   Treadmill   MPH 4.5  intervals (see above comment)   Grade 4   Minutes 30   Elliptical   Level 4   Speed 5   Minutes 15      Nutrition:  Target Goals: Understanding of nutrition guidelines, daily intake of sodium <1500m28mholesterol <200mg35mlories 30% from fat and 7% or less from saturated fats, daily to have 5 or more servings of fruits and vegetables.  Biometrics:     Pre Biometrics - 08/15/15 1411    Pre Biometrics   Height 5' 8.4" (1.737 m)   Weight 164 lb 6.4 oz (74.571 kg)   Waist Circumference 38.5 inches   Hip Circumference 39 inches   Waist to Hip Ratio 0.99 %   BMI (Calculated) 24.8       Nutrition Therapy Plan and Nutrition Goals:     Nutrition Therapy & Goals - 08/29/15 1207  Nutrition Therapy   Diet Instructed on a meal plan based on 1800 calories including DASH diet principles.   Drug/Food Interactions Statins/Certain Fruits   Protein (specify units) 8   Fiber 30 grams   Whole Grain Foods 3 servings   Saturated Fats 12 max. grams   Fruits and Vegetables 5 servings/day   Sodium 1500 grams   Personal Nutrition Goals   Personal Goal #1 Increase fruits/vegetables with a goal of 5 servings per day.   Personal Goal #2 Try very low sodium tuna.   Personal Goal #3 When eating out, request that  no salt be added.   Intervention Plan   Intervention Prescribe, educate and counsel regarding individualized specific dietary modifications aiming towards targeted core components such as weight, hypertension, lipid management, diabetes, heart failure and other comorbidities.;Nutrition handout(s) given to patient.   Expected Outcomes Short Term Goal: Understand basic principles of dietary content, such as calories, fat, sodium, cholesterol and nutrients.;Short Term Goal: A plan has been developed with personal nutrition goals set during dietitian appointment.;Long Term Goal: Adherence to prescribed nutrition plan.      Nutrition Discharge: Rate Your Plate Scores:     Nutrition Assessments - 10/09/15 1329    Rate Your Plate Scores   Post Score 69   Post Score % 77 %      Nutrition Goals Re-Evaluation:   Psychosocial: Target Goals: Acknowledge presence or absence of depression, maximize coping skills, provide positive support system. Participant is able to verbalize types and ability to use techniques and skills needed for reducing stress and depression.  Initial Review & Psychosocial Screening:     Initial Psych Review & Screening - 08/15/15 1418    Initial Review   Current issues with Current Sleep Concerns   Family Dynamics   Good Support System? Yes   Barriers   Psychosocial barriers to participate in program The patient should benefit from training in stress management and relaxation.   Screening Interventions   Interventions Encouraged to exercise      Quality of Life Scores:     Quality of Life - 10/09/15 1331    Quality of Life Scores   Health/Function Post 19 %   Socioeconomic Post 19.4 %   Psych/Spiritual Post 25.43 %   Family Post 13.8 %   GLOBAL Post 19.66 %      PHQ-9:     Recent Review Flowsheet Data    Depression screen [facility] 2/9 10/09/2015 08/15/2015   Decreased Interest 0 0   Down, Depressed, Hopeless 1 1   PHQ - 2 Score 1 1   Altered sleeping 3 3    Tired, decreased energy 0 1   Change in appetite 3 1   Feeling bad or failure about yourself  0 0   Trouble concentrating 0 0   Moving slowly or fidgety/restless 0 0   Suicidal thoughts 0 0   PHQ-9 Score 7 6   Difficult doing work/chores Not difficult at all Somewhat difficult      Psychosocial Evaluation and Intervention:     Psychosocial Evaluation - 08/26/15 0949    Psychosocial Evaluation & Interventions   Interventions Encouraged to exercise with the program and follow exercise prescription;Relaxation education;Stress management education   Comments Counselor met with Mr. Coralyn Helling today for initial psychosocial evaluation.  He is a 55 year old who had triple bypass approximately one month ago.  He has a strong support system with a spouse of 7 years and (7) children several who are  adults.  He also has his mother and father-in-law close by and is actively involved in his local faith community.  Mr. Coralyn Helling states that he is sleeping somewhat better since the surgery and has a goos appetite.  He admits to a history of undiagnosed/untreated depression as well as some current symptoms but his current mood is optimistic.  Mr. Coralyn Helling states that he is currently unable to work, but job stress is something he has experienced a great deal of, as well as his health currently is a stressor.  He has goals to be able to run on the treadmill before he completes this program in order to be able to run outside upon discharge.  He also enjoys walking outside.  Counselor recommended monitoring Mr. Coralyn Helling to see if his mood overall is improved with consistent exercise and education in this program.  If not, Counselor will recommend a therapist or a medication evaluation at that time.     Continued Psychosocial Services Needed Yes  Mr. Coralyn Helling will benefit from the psychoeducational components of this program, especially stress management and depression, as well as consistent exercise.        Psychosocial  Re-Evaluation:     Psychosocial Re-Evaluation      09/16/15 0955 09/25/15 0939         Psychosocial Re-Evaluation   Comments Follow up with Mr. Coralyn Helling reporting he is enjoying this program and the benefits of exercise in his life.  However, he continues to report that his mood has not improved much.  He agreed to speak with his doctor about this when he sees him on 4/12, and counselor will continue to follow with him about this. Mr. Coralyn Helling attended the depression education today at Cardiac Rehab and reported that he will definitely be speaking with his Dr. about his current mood and depressive symptoms next week.  He continues to report that his sleep is erratic and he mentioned that depression runs in his family.  Counselor commended Mr. Coralyn Helling for being proactive in this area and will continue to follow with him  concerning his mood.         Vocational Rehabilitation: Provide vocational rehab assistance to qualifying candidates.   Vocational Rehab Evaluation & Intervention:     Vocational Rehab - 08/15/15 1300    Initial Vocational Rehab Evaluation & Intervention   Assessment shows need for Vocational Rehabilitation No      Education: Education Goals: Education classes will be provided on a weekly basis, covering required topics. Participant will state understanding/return demonstration of topics presented.  Learning Barriers/Preferences:     Learning Barriers/Preferences - 08/15/15 1412    Learning Barriers/Preferences   Learning Barriers None   Learning Preferences None      Education Topics: General Nutrition Guidelines/Fats and Fiber: -Group instruction provided by verbal, written material, models and posters to present the general guidelines for heart healthy nutrition. Gives an explanation and review of dietary fats and fiber.          Cardiac Rehab from 10/09/2015 in [facility] Cardiac and Pulmonary Rehab   Date  09/23/15   Educator  CR   Instruction Review Code  2-  meets goals/outcomes      Controlling Sodium/Reading Food Labels: -Group verbal and written material supporting the discussion of sodium use in heart healthy nutrition. Review and explanation with models, verbal and written materials for utilization of the food label.      Cardiac Rehab from 10/09/2015 in [facility] Cardiac and Pulmonary Rehab  Date  09/30/15   Educator  C. Joneen Caraway, RD   Instruction Review Code  2- meets goals/outcomes      Exercise Physiology & Risk Factors: - Group verbal and written instruction with models to review the exercise physiology of the cardiovascular system and associated critical values. Details cardiovascular disease risk factors and the goals associated with each risk factor.      Cardiac Rehab from 10/09/2015 in [facility] Cardiac and Pulmonary Rehab   Date  10/09/15   Educator  BS   Instruction Review Code  2- meets goals/outcomes      Aerobic Exercise & Resistance Training: - Gives group verbal and written discussion on the health impact of inactivity. On the components of aerobic and resistive training programs and the benefits of this training and how to safely progress through these programs.   Flexibility, Balance, General Exercise Guidelines: - Provides group verbal and written instruction on the benefits of flexibility and balance training programs. Provides general exercise guidelines with specific guidelines to those with heart or lung disease. Demonstration and skill practice provided.      Cardiac Rehab from 10/09/2015 in [facility] Cardiac and Pulmonary Rehab   Date  08/26/15   Educator  [facility]   Instruction Review Code  2- meets goals/outcomes      Stress Management: - Provides group verbal and written instruction about the health risks of elevated stress, cause of high stress, and healthy ways to reduce stress.      Cardiac Rehab from 10/09/2015 in [facility] Cardiac and Pulmonary Rehab   Date  08/28/15   Educator  [facility]   Instruction Review Code  2- meets  goals/outcomes      Depression: - Provides group verbal and written instruction on the correlation between heart/lung disease and depressed mood, treatment options, and the stigmas associated with seeking treatment.      Cardiac Rehab from 10/09/2015 in [facility] Cardiac and Pulmonary Rehab   Date  09/25/15   Educator  Elissa Hefty   Instruction Review Code  2- meets goals/outcomes      Anatomy & Physiology of the Heart: - Group verbal and written instruction and models provide basic cardiac anatomy and physiology, with the coronary electrical and arterial systems. Review of: AMI, Angina, Valve disease, Heart Failure, Cardiac Arrhythmia, Pacemakers, and the ICD.      Cardiac Rehab from 10/09/2015 in [facility] Cardiac and Pulmonary Rehab   Date  09/02/15   Educator  SB   Instruction Review Code  2- meets goals/outcomes      Cardiac Procedures: - Group verbal and written instruction and models to describe the testing methods done to diagnose heart disease. Reviews the outcomes of the test results. Describes the treatment choices: Medical Management, Angioplasty, or Coronary Bypass Surgery.      Cardiac Rehab from 10/09/2015 in [facility] Cardiac and Pulmonary Rehab   Date  09/09/15   Educator  SB   Instruction Review Code  2- meets goals/outcomes      Cardiac Medications: - Group verbal and written instruction to review commonly prescribed medications for heart disease. Reviews the medication, class of the drug, and side effects. Includes the steps to properly store meds and maintain the prescription regimen.      Cardiac Rehab from 10/09/2015 in [facility] Cardiac and Pulmonary Rehab   Date  09/11/15   Educator  SB   Instruction Review Code  2- meets goals/outcomes      Go Sex-Intimacy & Heart Disease, Get SMART - Goal Setting: -  Group verbal and written instruction through game format to discuss heart disease and the return to sexual intimacy. Provides group verbal and written material to discuss and  apply goal setting through the application of the S.M.A.R.T. Method.      Cardiac Rehab from 10/09/2015 in [facility] Cardiac and Pulmonary Rehab   Date  09/09/15   Educator  SB   Instruction Review Code  2- meets goals/outcomes      Other Matters of the Heart: - Provides group verbal, written materials and models to describe Heart Failure, Angina, Valve Disease, and Diabetes in the realm of heart disease. Includes description of the disease process and treatment options available to the cardiac patient.   Exercise & Equipment Safety: - Individual verbal instruction and demonstration of equipment use and safety with use of the equipment.      Cardiac Rehab from 10/09/2015 in [facility] Cardiac and Pulmonary Rehab   Date  08/15/15   Educator  C. EnterkinRN   Instruction Review Code  1- partially meets, needs review/practice      Infection Prevention: - Provides verbal and written material to individual with discussion of infection control including proper hand washing and proper equipment cleaning during exercise session.      Cardiac Rehab from 10/09/2015 in [facility] Cardiac and Pulmonary Rehab   Date  08/15/15   Educator  C. EnterkinRN   Instruction Review Code  2- meets goals/outcomes      Falls Prevention: - Provides verbal and written material to individual with discussion of falls prevention and safety.      Cardiac Rehab from 10/09/2015 in [facility] Cardiac and Pulmonary Rehab   Date  08/15/15   Educator  C. Enterkin   Instruction Review Code  2- meets goals/outcomes      Diabetes: - Individual verbal and written instruction to review signs/symptoms of diabetes, desired ranges of glucose level fasting, after meals and with exercise. Advice that pre and post exercise glucose checks will be done for 3 sessions at entry of program.    Knowledge Questionnaire Score:     Knowledge Questionnaire Score - 10/09/15 1331    Knowledge Questionnaire Score   Post Score 28      Core  Components/Risk Factors/Patient Goals at Admission:     Personal Goals and Risk Factors at Admission - 08/15/15 1417    Core Components/Risk Factors/Patient Goals on Admission   Tobacco Cessation Yes   Expected Outcomes Long Term: Complete abstinence from all tobacco products for at least 12 months from quit date.  Brydon quit smoking his one pack per day for 34 years on Jul 22, 2015. Oluwatimileyin said his triggers are driving which he isn't able to drive until August 22, 2015.    Lipids Yes   Intervention Provide education and support for participant on nutrition & aerobic/resistive exercise along with prescribed medications to achieve LDL <7m, HDL >423m   Expected Outcomes Short Term: Participant states understanding of desired cholesterol values and is compliant with medications prescribed. Participant is following exercise prescription and nutrition guidelines.;Long Term: Cholesterol controlled with medications as prescribed, with individualized exercise RX and with personalized nutrition plan. Value goals: LDL < 7020mHDL > 40 mg.      Core Components/Risk Factors/Patient Goals Review:      Goals and Risk Factor Review      09/12/15 1330 10/02/15 0928 10/02/15 0933 10/09/15 1332     Core Components/Risk Factors/Patient Goals Review   Personal Goals Review Hypertension Weight Management/Obesity;Tobacco Cessation;Lipids;Hypertension  Review Review of chart shows that Kelwin has blood pressure control.  He has lost 2 pounds since starting the program Maze is increasing exercise levels and following dietary recommendations.  He is taking medications for blood pressure and cholesterol as prescribed.  He has not smoked since January but is aware that returning to work may be a trigger for him.  Pantelis may volunteer for [place].     Expected Outcomes Continued maintanane of blood pressure.  Cass will continue to demonstrate adherance to medication regimen and exercise and dietary recommendations. Alvino will  continue to demonstrate adherance to medication regimen and exercise and dietary recommendations.        Core Components/Risk Factors/Patient Goals at Discharge (Final Review):      Goals and Risk Factor Review - 10/09/15 1332    Core Components/Risk Factors/Patient Goals Review   Review Lenox may volunteer for [place].       ITP Comments:     ITP Comments      08/15/15 1414 08/21/15 0717 09/12/15 1329 09/13/15 0903 10/02/15 0932   ITP Comments Elta Gu[place] said he feels his sternum pain sometimes especially when he coughs or sneezes but just sitting in a chair he has no pain. Damaria is able to drive August 21, 3289, Jaaziah quit smoking July 22, 2015.  30 day review   Cotinue with ITP      Has attended orientation, expecting to start sessions soon 30 Day Review. Continue with the ITP. Thadd said he was running on the pavement outside yesterday and his legs hurt some last night. He said he has good tennis shoes. He said this happens sometimes when he is running on pavement but he sayd he does well on the treadmill in here.  Beni is increasing exercise levels and following dietary recommendations.  He is taking medications for blood pressure and cholesterol as prescribed.  He has not smoked since January but is aware that returning to work may be a trigger for him.     10/02/15 0933 10/13/15 0930         ITP Comments Daemien will continue to demonstrate adherance to medication regimen and exercise and dietary recommendations. 30 day review.  Continue with ITP         Comments:

## 2015-10-14 ENCOUNTER — Encounter: Payer: 59 | Admitting: *Deleted

## 2015-10-14 DIAGNOSIS — Z951 Presence of aortocoronary bypass graft: Secondary | ICD-10-CM

## 2015-10-14 NOTE — Patient Instructions (Signed)
Discharge Instructions  Patient Details  Name: Jeffrey Spence MRN: [id number] Date of Birth: 04-08-1961 Referring Provider:  Skeet Latch, MD   Number of Visits: 66  Reason for Discharge:  Patient reached a stable level of exercise. Patient independent in their exercise.  Smoking History:  History  Smoking status  . Former Smoker -- 1.00 packs/day for 30 years  . Types: Cigarettes  . Quit date: 07/17/2015  Smokeless tobacco  . Never Used    Diagnosis:  S/P CABG x 3  Initial Exercise Prescription:     Initial Exercise Prescription - 08/15/15 1400    Date of Initial Exercise RX and Referring Provider   Date 08/15/15   Treadmill   MPH 2.5   Grade 0   Minutes 10   Recumbant Bike   Level 2   RPM 40   Watts 20   Minutes 10   NuStep   Level 2   Watts 40   Minutes 10   Arm Ergometer   Level 1   Watts 10   Minutes 10   Recumbant Elliptical   Level 2   RPM 40   Watts 20   Minutes 10   REL-XR   Level 2   Watts 40   Minutes 10   T5 Nustep   Level 1   Watts 15   Minutes 10   Biostep-RELP   Level 2   Watts 40   Minutes 10   Prescription Details   Frequency (times per week) 3   Duration Progress to 30 minutes of continuous aerobic without signs/symptoms of physical distress   Intensity   THRR REST +  30   Ratings of Perceived Exertion 11-15   Perceived Dyspnea 2-4   Progression   Progression Continue progressive overload as per policy without signs/symptoms or physical distress.   Resistance Training   Training Prescription Yes   Weight 1   Reps 10-15      Discharge Exercise Prescription (Final Exercise Prescription Changes):     Exercise Prescription Changes - 09/30/15 0800    Exercise Review   Progression Yes   Response to Exercise   Blood Pressure (Admit) 106/64 mmHg   Blood Pressure (Exercise) 144/78 mmHg   Blood Pressure (Exit) 126/74 mmHg   Heart Rate (Admit) 72 bpm   Heart Rate (Exercise) 108 bpm   Heart Rate (Exit) 81 bpm   Rating of Perceived Exertion (Exercise) 13   Symptoms none   Duration Progress to 45 minutes of aerobic exercise without signs/symptoms of physical distress   Intensity Rest + 30   Progression   Progression Continue progressive overload as per policy without signs/symptoms or physical distress.   Resistance Training   Training Prescription Yes   Weight 5   Reps 10-15   Interval Training   Interval Training Yes   Equipment Treadmill   Comments 3 min walking at 3.30mph followed by 2 min jogging at4.0 mph, with elevation consistantly at 4%   Treadmill   MPH 4.5  intervals (see above comment)   Grade 4   Minutes 30   Elliptical   Level 4   Speed 5   Minutes 15      Functional Capacity:     6 Minute Walk      08/15/15 1409 10/11/15 0941 10/14/15 1206   6 Minute Walk   Phase Initial Discharge Initial   Distance 1525 feet 2133 feet 2133 feet   Walk Time 6 minutes 6 minutes 6 minutes   #  of Rest Breaks 0     RPE  12 12   Symptoms No     Resting HR 61 bpm 68 bpm 68 bpm   Resting BP 128/60 mmHg 110/70 mmHg 110/70 mmHg   Max Ex. HR 90 bpm 110 bpm 110 bpm   Max Ex. BP 128/64 mmHg 148/66 mmHg 148/66 mmHg      Quality of Life:     Quality of Life - 10/09/15 1331    Quality of Life Scores   Health/Function Post 19 %   Socioeconomic Post 19.4 %   Psych/Spiritual Post 25.43 %   Family Post 13.8 %   GLOBAL Post 19.66 %      Personal Goals: Goals established at orientation with interventions provided to work toward goal.     Personal Goals and Risk Factors at Admission - 08/15/15 1417    Core Components/Risk Factors/Patient Goals on Admission   Tobacco Cessation Yes   Expected Outcomes Long Term: Complete abstinence from all tobacco products for at least 12 months from quit date.  Jeffrey Spence quit smoking his one pack per day for 34 years on Jul 22, 2015. Jeffrey Spence said his triggers are driving which he isn't able to drive until August 22, 2015.    Lipids Yes   Intervention Provide  education and support for participant on nutrition & aerobic/resistive exercise along with prescribed medications to achieve LDL 70mg , HDL >40mg .   Expected Outcomes Short Term: Participant states understanding of desired cholesterol values and is compliant with medications prescribed. Participant is following exercise prescription and nutrition guidelines.;Long Term: Cholesterol controlled with medications as prescribed, with individualized exercise RX and with personalized nutrition plan. Value goals: LDL < 70mg , HDL > 40 mg.       Personal Goals Discharge:     Goals and Risk Factor Review - 10/09/15 1332    Core Components/Risk Factors/Patient Goals Review   Review Occupational psychologist for [place].       Nutrition & Weight - Outcomes:     Pre Biometrics - 08/15/15 1411    Pre Biometrics   Height 5' 8.4" (1.737 m)   Weight 164 lb 6.4 oz (74.571 kg)   Waist Circumference 38.5 inches   Hip Circumference 39 inches   Waist to Hip Ratio 0.99 %   BMI (Calculated) 24.8       Nutrition:     Nutrition Therapy & Goals - 08/29/15 1207    Nutrition Therapy   Diet Instructed on a meal plan based on 1800 calories including DASH diet principles.   Drug/Food Interactions Statins/Certain Fruits   Protein (specify units) 8   Fiber 30 grams   Whole Grain Foods 3 servings   Saturated Fats 12 max. grams   Fruits and Vegetables 5 servings/day   Sodium 1500 grams   Personal Nutrition Goals   Personal Goal #1 Increase fruits/vegetables with a goal of 5 servings per day.   Personal Goal #2 Try very low sodium tuna.   Personal Goal #3 When eating out, request that no salt be added.   Intervention Plan   Intervention Prescribe, educate and counsel regarding individualized specific dietary modifications aiming towards targeted core components such as weight, hypertension, lipid management, diabetes, heart failure and other comorbidities.;Nutrition handout(s) given to patient.   Expected Outcomes  Short Term Goal: Understand basic principles of dietary content, such as calories, fat, sodium, cholesterol and nutrients.;Short Term Goal: A plan has been developed with personal nutrition goals set during dietitian appointment.;Long Term Goal:  Adherence to prescribed nutrition plan.      Nutrition Discharge:     Nutrition Assessments - 10/09/15 1329    Rate Your Plate Scores   Post Score 69   Post Score % 77 %      Education Questionnaire Score:     Knowledge Questionnaire Score - 10/09/15 1331    Knowledge Questionnaire Score   Post Score 28      Goals reviewed with patient; copy given to patient.

## 2015-10-14 NOTE — Progress Notes (Signed)
Daily Session Note  Patient Details  Name: Jeffrey Spence MRN: [id number] Date of Birth: 07-19-1960 Referring Provider:    Encounter Date: 10/14/2015  Check In:     Session Check In - 10/14/15 0851    Check-In   Location [facility]   Staff Present Heath Lark, RN, BSN, Laveda Norman, BS, ACSM CEP, Exercise Physiologist;Rebecca Brayton El, DPT, CEEA   Supervising physician immediately available to respond to emergencies See telemetry face sheet for immediately available ER MD   Medication changes reported     No   Fall or balance concerns reported    No   Warm-up and Cool-down Performed on first and last piece of equipment   Resistance Training Performed Yes   VAD Patient? No   Pain Assessment   Currently in Pain? No/denies   Multiple Pain Sites No         Goals Met:  Independence with exercise equipment Exercise tolerated well No report of cardiac concerns or symptoms Strength training completed today  Goals Unmet:  Not Applicable  Comments: Patient completed exercise prescription and all exercise goals during rehab session. The exercise was tolerated well and the patient is progressing in the program.     Dr. Emily Filbert is Medical Director for [place] [facility] and [facility].

## 2015-10-14 NOTE — Progress Notes (Signed)
Discharge Summary  Patient Details  Name: Jeffrey Spence MRN: [id number] Date of Birth: 1961/06/15 Referring Provider:     Number of Visits: completed  Reason for Discharge:  Patient reached a stable level of exercise. Patient independent in their exercise.  Smoking History:  History  Smoking status  . Former Smoker -- 1.00 packs/day for 30 years  . Types: Cigarettes  . Quit date: 07/17/2015  Smokeless tobacco  . Never Used    Diagnosis:  S/P CABG x 3  ADL UCSD:   Initial Exercise Prescription:     Initial Exercise Prescription - 08/15/15 1400    Date of Initial Exercise RX and Referring Provider   Date 08/15/15   Treadmill   MPH 2.5   Grade 0   Minutes 10   Recumbant Bike   Level 2   RPM 40   Watts 20   Minutes 10   NuStep   Level 2   Watts 40   Minutes 10   Arm Ergometer   Level 1   Watts 10   Minutes 10   Recumbant Elliptical   Level 2   RPM 40   Watts 20   Minutes 10   REL-XR   Level 2   Watts 40   Minutes 10   T5 Nustep   Level 1   Watts 15   Minutes 10   Biostep-RELP   Level 2   Watts 40   Minutes 10   Prescription Details   Frequency (times per week) 3   Duration Progress to 30 minutes of continuous aerobic without signs/symptoms of physical distress   Intensity   THRR REST +  30   Ratings of Perceived Exertion 11-15   Perceived Dyspnea 2-4   Progression   Progression Continue progressive overload as per policy without signs/symptoms or physical distress.   Resistance Training   Training Prescription Yes   Weight 1   Reps 10-15      Discharge Exercise Prescription (Final Exercise Prescription Changes):     Exercise Prescription Changes - 09/30/15 0800    Exercise Review   Progression Yes   Response to Exercise   Blood Pressure (Admit) 106/64 mmHg   Blood Pressure (Exercise) 144/78 mmHg   Blood Pressure (Exit) 126/74 mmHg   Heart Rate (Admit) 72 bpm   Heart Rate (Exercise) 108 bpm   Heart Rate (Exit) 81 bpm    Rating of Perceived Exertion (Exercise) 13   Symptoms none   Duration Progress to 45 minutes of aerobic exercise without signs/symptoms of physical distress   Intensity Rest + 30   Progression   Progression Continue progressive overload as per policy without signs/symptoms or physical distress.   Resistance Training   Training Prescription Yes   Weight 5   Reps 10-15   Interval Training   Interval Training Yes   Equipment Treadmill   Comments 3 min walking at 3.8mph followed by 2 min jogging at4.0 mph, with elevation consistantly at 4%   Treadmill   MPH 4.5  intervals (see above comment)   Grade 4   Minutes 30   Elliptical   Level 4   Speed 5   Minutes 15      Functional Capacity:     6 Minute Walk      08/15/15 1409 10/11/15 0941 10/14/15 1206   6 Minute Walk   Phase Initial Discharge Initial   Distance 1525 feet 2133 feet 2133 feet   Walk Time 6 minutes 6 minutes  6 minutes   # of Rest Breaks 0     RPE  12 12   Symptoms No     Resting HR 61 bpm 68 bpm 68 bpm   Resting BP 128/60 mmHg 110/70 mmHg 110/70 mmHg   Max Ex. HR 90 bpm 110 bpm 110 bpm   Max Ex. BP 128/64 mmHg 148/66 mmHg 148/66 mmHg      Psychological, QOL, Others - Outcomes: PHQ 2/9: Depression screen [facility] 2/9 10/09/2015 08/15/2015  Decreased Interest 0 0  Down, Depressed, Hopeless 1 1  PHQ - 2 Score 1 1  Altered sleeping 3 3  Tired, decreased energy 0 1  Change in appetite 3 1  Feeling bad or failure about yourself  0 0  Trouble concentrating 0 0  Moving slowly or fidgety/restless 0 0  Suicidal thoughts 0 0  PHQ-9 Score 7 6  Difficult doing work/chores Not difficult at all Somewhat difficult    Quality of Life:     Quality of Life - 10/09/15 1331    Quality of Life Scores   Health/Function Post 19 %   Socioeconomic Post 19.4 %   Psych/Spiritual Post 25.43 %   Family Post 13.8 %   GLOBAL Post 19.66 %      Personal Goals: Goals established at orientation with interventions provided to  work toward goal.     Personal Goals and Risk Factors at Admission - 08/15/15 1417    Core Components/Risk Factors/Patient Goals on Admission   Tobacco Cessation Yes   Expected Outcomes Long Term: Complete abstinence from all tobacco products for at least 12 months from quit date.  Daemon quit smoking his one pack per day for 34 years on Jul 22, 2015. Jahmeir said his triggers are driving which he isn't able to drive until August 22, 2015.    Lipids Yes   Intervention Provide education and support for participant on nutrition & aerobic/resistive exercise along with prescribed medications to achieve LDL 70mg , HDL >40mg .   Expected Outcomes Short Term: Participant states understanding of desired cholesterol values and is compliant with medications prescribed. Participant is following exercise prescription and nutrition guidelines.;Long Term: Cholesterol controlled with medications as prescribed, with individualized exercise RX and with personalized nutrition plan. Value goals: LDL < 70mg , HDL > 40 mg.       Personal Goals Discharge:     Goals and Risk Factor Review      09/12/15 1330 10/02/15 0928 10/02/15 0933 10/09/15 1332     Core Components/Risk Factors/Patient Goals Review   Personal Goals Review Hypertension Weight Management/Obesity;Tobacco Cessation;Lipids;Hypertension      Review Review of chart shows that Conlin has blood pressure control.  He has lost 2 pounds since starting the program Shanne is increasing exercise levels and following dietary recommendations.  He is taking medications for blood pressure and cholesterol as prescribed.  He has not smoked since January but is aware that returning to work may be a trigger for him.  Robby may volunteer for [place].     Expected Outcomes Continued maintanane of blood pressure.  Josecarlos will continue to demonstrate adherance to medication regimen and exercise and dietary recommendations. Ida will continue to demonstrate adherance to medication regimen and  exercise and dietary recommendations.        Nutrition & Weight - Outcomes:     Pre Biometrics - 08/15/15 1411    Pre Biometrics   Height 5' 8.4" (1.737 m)   Weight 164 lb 6.4 oz (74.571 kg)  Waist Circumference 38.5 inches   Hip Circumference 39 inches   Waist to Hip Ratio 0.99 %   BMI (Calculated) 24.8       Nutrition:     Nutrition Therapy & Goals - 08/29/15 1207    Nutrition Therapy   Diet Instructed on a meal plan based on 1800 calories including DASH diet principles.   Drug/Food Interactions Statins/Certain Fruits   Protein (specify units) 8   Fiber 30 grams   Whole Grain Foods 3 servings   Saturated Fats 12 max. grams   Fruits and Vegetables 5 servings/day   Sodium 1500 grams   Personal Nutrition Goals   Personal Goal #1 Increase fruits/vegetables with a goal of 5 servings per day.   Personal Goal #2 Try very low sodium tuna.   Personal Goal #3 When eating out, request that no salt be added.   Intervention Plan   Intervention Prescribe, educate and counsel regarding individualized specific dietary modifications aiming towards targeted core components such as weight, hypertension, lipid management, diabetes, heart failure and other comorbidities.;Nutrition handout(s) given to patient.   Expected Outcomes Short Term Goal: Understand basic principles of dietary content, such as calories, fat, sodium, cholesterol and nutrients.;Short Term Goal: A plan has been developed with personal nutrition goals set during dietitian appointment.;Long Term Goal: Adherence to prescribed nutrition plan.      Nutrition Discharge:     Nutrition Assessments - 10/09/15 1329    Rate Your Plate Scores   Post Score 69   Post Score % 77 %      Education Questionnaire Score:     Knowledge Questionnaire Score - 10/09/15 1331    Knowledge Questionnaire Score   Post Score 28      Goals reviewed with patient; copy given to patient.

## 2015-10-16 ENCOUNTER — Encounter: Payer: 59 | Admitting: *Deleted

## 2015-10-16 DIAGNOSIS — Z951 Presence of aortocoronary bypass graft: Secondary | ICD-10-CM | POA: Diagnosis not present

## 2015-10-16 NOTE — Progress Notes (Signed)
Discharge Summary  Patient Details  Name: Jeffrey Spence MRN: [id number] Date of Birth: 1960/09/16 Referring Provider:     Number of Visits: 36  Reason for Discharge:  Patient reached a stable level of exercise. Patient independent in their exercise.  Smoking History:  History  Smoking status  . Former Smoker -- 1.00 packs/day for 30 years  . Types: Cigarettes  . Quit date: 07/17/2015  Smokeless tobacco  . Never Used    Diagnosis:  S/P CABG x 3  ADL UCSD:   Initial Exercise Prescription:     Initial Exercise Prescription - 08/15/15 1400    Date of Initial Exercise RX and Referring Provider   Date 08/15/15   Treadmill   MPH 2.5   Grade 0   Minutes 10   Recumbant Bike   Level 2   RPM 40   Watts 20   Minutes 10   NuStep   Level 2   Watts 40   Minutes 10   Arm Ergometer   Level 1   Watts 10   Minutes 10   Recumbant Elliptical   Level 2   RPM 40   Watts 20   Minutes 10   REL-XR   Level 2   Watts 40   Minutes 10   T5 Nustep   Level 1   Watts 15   Minutes 10   Biostep-RELP   Level 2   Watts 40   Minutes 10   Prescription Details   Frequency (times per week) 3   Duration Progress to 30 minutes of continuous aerobic without signs/symptoms of physical distress   Intensity   THRR REST +  30   Ratings of Perceived Exertion 11-15   Perceived Dyspnea 2-4   Progression   Progression Continue progressive overload as per policy without signs/symptoms or physical distress.   Resistance Training   Training Prescription Yes   Weight 1   Reps 10-15      Discharge Exercise Prescription (Final Exercise Prescription Changes):     Exercise Prescription Changes - 09/30/15 0800    Exercise Review   Progression Yes   Response to Exercise   Blood Pressure (Admit) 106/64 mmHg   Blood Pressure (Exercise) 144/78 mmHg   Blood Pressure (Exit) 126/74 mmHg   Heart Rate (Admit) 72 bpm   Heart Rate (Exercise) 108 bpm   Heart Rate (Exit) 81 bpm   Rating of  Perceived Exertion (Exercise) 13   Symptoms none   Duration Progress to 45 minutes of aerobic exercise without signs/symptoms of physical distress   Intensity Rest + 30   Progression   Progression Continue progressive overload as per policy without signs/symptoms or physical distress.   Resistance Training   Training Prescription Yes   Weight 5   Reps 10-15   Interval Training   Interval Training Yes   Equipment Treadmill   Comments 3 min walking at 3.64mph followed by 2 min jogging at4.0 mph, with elevation consistantly at 4%   Treadmill   MPH 4.5  intervals (see above comment)   Grade 4   Minutes 30   Elliptical   Level 4   Speed 5   Minutes 15      Functional Capacity:     6 Minute Walk      08/15/15 1409 10/11/15 0941 10/14/15 1206   6 Minute Walk   Phase Initial Discharge Initial   Distance 1525 feet 2133 feet 2133 feet   Walk Time 6 minutes 6 minutes  6 minutes   # of Rest Breaks 0     RPE  12 12   Symptoms No     Resting HR 61 bpm 68 bpm 68 bpm   Resting BP 128/60 mmHg 110/70 mmHg 110/70 mmHg   Max Ex. HR 90 bpm 110 bpm 110 bpm   Max Ex. BP 128/64 mmHg 148/66 mmHg 148/66 mmHg      Psychological, QOL, Others - Outcomes: PHQ 2/9: Depression screen [facility] 2/9 10/09/2015 08/15/2015  Decreased Interest 0 0  Down, Depressed, Hopeless 1 1  PHQ - 2 Score 1 1  Altered sleeping 3 3  Tired, decreased energy 0 1  Change in appetite 3 1  Feeling bad or failure about yourself  0 0  Trouble concentrating 0 0  Moving slowly or fidgety/restless 0 0  Suicidal thoughts 0 0  PHQ-9 Score 7 6  Difficult doing work/chores Not difficult at all Somewhat difficult    Quality of Life:     Quality of Life - 10/09/15 1331    Quality of Life Scores   Health/Function Post 19 %   Socioeconomic Post 19.4 %   Psych/Spiritual Post 25.43 %   Family Post 13.8 %   GLOBAL Post 19.66 %      Personal Goals: Goals established at orientation with interventions provided to work toward  goal.     Personal Goals and Risk Factors at Admission - 08/15/15 1417    Core Components/Risk Factors/Patient Goals on Admission   Tobacco Cessation Yes   Expected Outcomes Long Term: Complete abstinence from all tobacco products for at least 12 months from quit date.  Koden quit smoking his one pack per day for 34 years on Jul 22, 2015. Dontravious said his triggers are driving which he isn't able to drive until August 22, 2015.    Lipids Yes   Intervention Provide education and support for participant on nutrition & aerobic/resistive exercise along with prescribed medications to achieve LDL 70mg , HDL >40mg .   Expected Outcomes Short Term: Participant states understanding of desired cholesterol values and is compliant with medications prescribed. Participant is following exercise prescription and nutrition guidelines.;Long Term: Cholesterol controlled with medications as prescribed, with individualized exercise RX and with personalized nutrition plan. Value goals: LDL < 70mg , HDL > 40 mg.       Personal Goals Discharge:     Goals and Risk Factor Review      09/12/15 1330 10/02/15 0928 10/02/15 0933 10/09/15 1332     Core Components/Risk Factors/Patient Goals Review   Personal Goals Review Hypertension Weight Management/Obesity;Tobacco Cessation;Lipids;Hypertension      Review Review of chart shows that Oryon has blood pressure control.  He has lost 2 pounds since starting the program Saud is increasing exercise levels and following dietary recommendations.  He is taking medications for blood pressure and cholesterol as prescribed.  He has not smoked since January but is aware that returning to work may be a trigger for him.  Welles may volunteer for [place].     Expected Outcomes Continued maintanane of blood pressure.  Rex will continue to demonstrate adherance to medication regimen and exercise and dietary recommendations. Traxton will continue to demonstrate adherance to medication regimen and exercise and  dietary recommendations.        Nutrition & Weight - Outcomes:     Pre Biometrics - 08/15/15 1411    Pre Biometrics   Height 5' 8.4" (1.737 m)   Weight 164 lb 6.4 oz (74.571 kg)  Waist Circumference 38.5 inches   Hip Circumference 39 inches   Waist to Hip Ratio 0.99 %   BMI (Calculated) 24.8       Nutrition:     Nutrition Therapy & Goals - 08/29/15 1207    Nutrition Therapy   Diet Instructed on a meal plan based on 1800 calories including DASH diet principles.   Drug/Food Interactions Statins/Certain Fruits   Protein (specify units) 8   Fiber 30 grams   Whole Grain Foods 3 servings   Saturated Fats 12 max. grams   Fruits and Vegetables 5 servings/day   Sodium 1500 grams   Personal Nutrition Goals   Personal Goal #1 Increase fruits/vegetables with a goal of 5 servings per day.   Personal Goal #2 Try very low sodium tuna.   Personal Goal #3 When eating out, request that no salt be added.   Intervention Plan   Intervention Prescribe, educate and counsel regarding individualized specific dietary modifications aiming towards targeted core components such as weight, hypertension, lipid management, diabetes, heart failure and other comorbidities.;Nutrition handout(s) given to patient.   Expected Outcomes Short Term Goal: Understand basic principles of dietary content, such as calories, fat, sodium, cholesterol and nutrients.;Short Term Goal: A plan has been developed with personal nutrition goals set during dietitian appointment.;Long Term Goal: Adherence to prescribed nutrition plan.      Nutrition Discharge:     Nutrition Assessments - 10/09/15 1329    Rate Your Plate Scores   Post Score 69   Post Score % 77 %      Education Questionnaire Score:     Knowledge Questionnaire Score - 10/09/15 1331    Knowledge Questionnaire Score   Post Score 28      Goals reviewed with patient; copy given to patient.

## 2015-10-16 NOTE — Progress Notes (Signed)
Daily Session Note  Patient Details  Name: [name] MRN: [id number] Date of Birth: 03-May-1961 Referring Provider:    Encounter Date: 10/16/2015  Check In:     Session Check In - 10/16/15 0840    Check-In   Staff Present Nyoka Cowden, RN;Ledford Goodson, RN, BSN, CCRP;Rebecca Sickles, DPT, CEEA   Supervising physician immediately available to respond to emergencies See telemetry face sheet for immediately available ER MD   Medication changes reported     No   Fall or balance concerns reported    No   Warm-up and Cool-down Performed on first and last piece of equipment   VAD Patient? No   Pain Assessment   Currently in Pain? No/denies         Goals Met:  Independence with exercise equipment Personal goals reviewed No report of cardiac concerns or symptoms Strength training completed today  Goals Unmet:  Not Applicable  Comments: discharged today   Dr. Emily Filbert is Medical Director for [facility] and [facility].

## 2015-10-16 NOTE — Progress Notes (Signed)
Cardiac Individual Treatment Plan  Patient Details  Name: Jeffrey Spence MRN: [id number] Date of Birth: Jun 08, 1961 Referring Provider:    Initial Encounter Date:       Cardiac Rehab from 08/15/2015 in [facility] Cardiac and Pulmonary Rehab   Date  08/15/15      Visit Diagnosis: S/P CABG x 3  Patient's Home Medications on Admission:  Current outpatient prescriptions:  .  aspirin 81 MG tablet, Take 1 tablet (81 mg total) by mouth daily., Disp: , Rfl:  .  atorvastatin (LIPITOR) 80 MG tablet, Take 1 tablet (80 mg total) by mouth daily at 6 PM., Disp: 30 tablet, Rfl: 3 .  clopidogrel (PLAVIX) 75 MG tablet, Take 1 tablet (75 mg total) by mouth daily., Disp: 90 tablet, Rfl: 3 .  metoprolol tartrate (LOPRESSOR) 25 MG tablet, Take 0.5 tablets (12.5 mg total) by mouth 2 (two) times daily., Disp: 60 tablet, Rfl: 3  Past Medical History: Past Medical History  Diagnosis Date  . Arthritis   . CAD (coronary artery disease)     a. nstemi 1.2017; b. cardiac cath 06/2015 with LM and severe 3 veseel dz (full report pending)  . HLD (hyperlipidemia)   . Tobacco abuse   . NSTEMI (non-ST elevated myocardial infarction) ([place]) 06/2015    Tobacco Use: History  Smoking status  . Former Smoker -- 1.00 packs/day for 30 years  . Types: Cigarettes  . Quit date: 07/17/2015  Smokeless tobacco  . Never Used    Labs: Recent Review Flowsheet Data    Labs for ITP Cardiac and Pulmonary Rehab Latest Ref Rng 07/25/2015 07/25/2015 07/25/2015 07/25/2015 07/26/2015   PHART 7.350 - 7.450 7.341(L) 7.333(L) 7.358 - -   PCO2ART 35.0 - 45.0 mmHg 39.6 33.9(L) 36.7 - -   HCO3 20.0 - 24.0 mEq/L 21.6 17.9(L) 20.5 - -   TCO2 0 - 100 mmol/L _0 ACIDBASEDEF 0.0 - 2.0 mmol/L 4.0(H) 7.0(H) 4.0(H) - -   O2SAT - 96.0 97.0 97.0 - -       Exercise Target Goals:    Exercise Program Goal: Individual exercise prescription set with THRR, safety & activity barriers. Participant demonstrates ability to understand and report RPE  using BORG scale, to self-measure pulse accurately, and to acknowledge the importance of the exercise prescription.  Exercise Prescription Goal: Starting with aerobic activity 30 plus minutes a day, 3 days per week for initial exercise prescription. Provide home exercise prescription and guidelines that participant acknowledges understanding prior to discharge.  Activity Barriers & Risk Stratification:     Activity Barriers & Cardiac Risk Stratification - 08/15/15 1258    Activity Barriers & Cardiac Risk Stratification   Activity Barriers Other (comment)  "sternum from CABG "WEight restirction of 10 lbs   Cardiac Risk Stratification High      6 Minute Walk:     6 Minute Walk      08/15/15 1409 10/11/15 0941 10/14/15 1206   6 Minute Walk   Phase Initial Discharge Initial   Distance 1525 feet 2133 feet 2133 feet   Walk Time 6 minutes 6 minutes 6 minutes   # of Rest Breaks 0     RPE  12 12   Symptoms No     Resting HR 61 bpm 68 bpm 68 bpm   Resting BP 128/60 mmHg 110/70 mmHg 110/70 mmHg   Max Ex. HR 90 bpm 110 bpm 110 bpm   Max Ex. BP 128/64 mmHg 148/66 mmHg 148/66 mmHg  Initial Exercise Prescription:     Initial Exercise Prescription - 08/15/15 1400    Date of Initial Exercise RX and Referring Provider   Date 08/15/15   Treadmill   MPH 2.5   Grade 0   Minutes 10   Recumbant Bike   Level 2   RPM 40   Watts 20   Minutes 10   NuStep   Level 2   Watts 40   Minutes 10   Arm Ergometer   Level 1   Watts 10   Minutes 10   Recumbant Elliptical   Level 2   RPM 40   Watts 20   Minutes 10   REL-XR   Level 2   Watts 40   Minutes 10   T5 Nustep   Level 1   Watts 15   Minutes 10   Biostep-RELP   Level 2   Watts 40   Minutes 10   Prescription Details   Frequency (times per week) 3   Duration Progress to 30 minutes of continuous aerobic without signs/symptoms of physical distress   Intensity   THRR REST +  30   Ratings of Perceived Exertion 11-15    Perceived Dyspnea 2-4   Progression   Progression Continue progressive overload as per policy without signs/symptoms or physical distress.   Resistance Training   Training Prescription Yes   Weight 1   Reps 10-15      Perform Capillary Blood Glucose checks as needed.  Exercise Prescription Changes:     Exercise Prescription Changes      08/30/15 0900 09/02/15 1200 09/06/15 0800 09/11/15 0800 09/27/15 1000   Exercise Review   Progression _0    Response to Exercise   Blood Pressure (Admit)  112/76 mmHg 112/76 mmHg     Blood Pressure (Exercise)  154/70 mmHg 154/70 mmHg     Blood Pressure (Exit)  114/76 mmHg 114/76 mmHg     Heart Rate (Admit)  73 bpm 73 bpm     Heart Rate (Exercise)  105 bpm 105 bpm     Heart Rate (Exit)  71 bpm 71 bpm     Rating of Perceived Exertion (Exercise)  12 12     Symptoms _1    Comments Reviewed individualized exercise prescription and made increases per departmental policy. Exercise increases were discussed with the patient and they were able to perform the new work loads without issue (no signs or symptoms).  Started Krishang on jogging intervals and he did very well and wants to eventually jog continuously and train for a 5K. We will steadily increase his jogging intervals in order to achieve this goal.  Extended jogging intervals to 2 minutes followed by 3-4 minutes at moderate pace. Qais is making great progress on the TM work and does well independently adjusting his intervals.      Duration Progress to 45 minutes of aerobic exercise without signs/symptoms of physical distress Progress to 45 minutes of aerobic exercise without signs/symptoms of physical distress Progress to 45 minutes of aerobic exercise without signs/symptoms of physical distress Progress to 45 minutes of aerobic exercise without signs/symptoms of physical distress Progress to 45 minutes of aerobic exercise without signs/symptoms of physical distress    Intensity Rest + 30 Rest + 30 Rest + 30 Rest + 30 Rest + 30   Progression   Progression Continue progressive overload as per policy without signs/symptoms or physical distress. Continue progressive overload as per policy without  signs/symptoms or physical distress. Continue progressive overload as per policy without signs/symptoms or physical distress. Continue progressive overload as per policy without signs/symptoms or physical distress. Continue progressive overload as per policy without signs/symptoms or physical distress.   Resistance Training   Training Prescription _0    Weight _1 Reps 10-15 10-15 10-15 10-15 10-15   Interval Training   Interval Training  Yes Yes Yes Yes   Equipment  Treadmill Treadmill Treadmill Treadmill   Comments  3 min walking at 3.2 mph followed by 1 min of jogging at 4.5 mph, repeat for 25 min 3-4 min walking at 3.2 mph followed by 2 min of jogging at 4.5 mph, repeat for 25 min 3-4 min walking at 3.2 mph followed by 2 min of jogging at 4.5 mph, repeat for 25 min 3-4 min walking at 3.2 mph followed by 2 min of jogging at 4.5 mph, repeat for 25 min   Treadmill   MPH 3.5 4.5 4.5 4.5 4.5   Grade 4 0 0 0 0   Minutes _2 Recumbant Bike   Level _3 RPM 40 40 40 40 40   Watts _4 Minutes _5 NuStep   Level _6 Watts 40 40 40 40 40   Minutes _7 Arm Ergometer   Level _8 Watts _9 Minutes _10 Recumbant Elliptical   Level _11 RPM 40 40 40 40 40   Watts _12 Minutes _13 Elliptical   Level _14 Speed 4.5 4.5 4._15 Minutes _16 REL-XR   Level _17 Watts 40 40 40 40 40   Minutes _18 T5 Nustep   Level _19 Watts _20 Minutes _21 Biostep-RELP   Level _22 Watts 40 40 40 40 40   Minutes _23 09/30/15 0800            Exercise Review   Progression Yes       Response to Exercise   Blood Pressure (Admit) 106/64 mmHg       Blood Pressure (Exercise) 144/78 mmHg       Blood Pressure (Exit) 126/74 mmHg       Heart Rate (Admit) 72 bpm       Heart Rate (Exercise) 108 bpm       Heart Rate (Exit) 81 bpm       Rating of Perceived Exertion (Exercise) 13       Symptoms none       Duration Progress to 45 minutes of aerobic exercise without signs/symptoms of physical distress       Intensity Rest + 30       Progression   Progression Continue progressive overload as per policy without signs/symptoms or physical distress.  Resistance Training   Training Prescription Yes       Weight 5       Reps 10-15       Interval Training   Interval Training Yes       Equipment Treadmill       Comments 3 min walking at 3.79mh followed by 2 min jogging at4.0 mph, with elevation consistantly at 4%       Treadmill   MPH 4.5  intervals (see above comment)       Grade 4       Minutes 30       Elliptical   Level 4       Speed 5       Minutes 15          Exercise Comments:     Exercise Comments      09/13/15 0901 09/30/15 1309 10/16/15 1203       Exercise Comments MHenrysaid he was running on the pavement outside yesterday and his legs hurt some last night. He said he has good tennis shoes. He said this happens sometimes when he is running on pavement but he sayd he does well on the treadmill in here.  MNemiahhas been working towards 357m/92min intervals. He has just now been able to achieve his high intensity 2 min invertals and wants to continue to work toward consistant running. He has been walking laps in his neighborhood that are up and down hills and we discussed how that was also good interval training.  Patient is approaching graduation of the program and home exercise plans were discussed. Details of the patient's exercise prescription and what they need to do in order to continue the prescription and  progress with exercise were outlined and the patient verbalized understanding. The patient plans to complete all exercise at  home.        Discharge Exercise Prescription (Final Exercise Prescription Changes):     Exercise Prescription Changes - 09/30/15 0800    Exercise Review   Progression Yes   Response to Exercise   Blood Pressure (Admit) 106/64 mmHg   Blood Pressure (Exercise) 144/78 mmHg   Blood Pressure (Exit) 126/74 mmHg   Heart Rate (Admit) 72 bpm   Heart Rate (Exercise) 108 bpm   Heart Rate (Exit) 81 bpm   Rating of Perceived Exertion (Exercise) 13   Symptoms none   Duration Progress to 45 minutes of aerobic exercise without signs/symptoms of physical distress   Intensity Rest + 30   Progression   Progression Continue progressive overload as per policy without signs/symptoms or physical distress.   Resistance Training   Training Prescription Yes   Weight 5   Reps 10-15   Interval Training   Interval Training Yes   Equipment Treadmill   Comments 3 min walking at 3.92m26mfollowed by 2 min jogging at4.0 mph, with elevation consistantly at 4%   Treadmill   MPH 4.5  intervals (see above comment)   Grade 4   Minutes 30   Elliptical   Level 4   Speed 5   Minutes 15      Nutrition:  Target Goals: Understanding of nutrition guidelines, daily intake of sodium <1500m68mholesterol <200mg192mlories 30% from fat and 7% or less from saturated fats, daily to have 5 or more servings of fruits and vegetables.  Biometrics:     Pre Biometrics - 08/15/15 1411    Pre Biometrics   Height 5' 8.4" (1.737 m)  Weight 164 lb 6.4 oz (74.571 kg)   Waist Circumference 38.5 inches   Hip Circumference 39 inches   Waist to Hip Ratio 0.99 %   BMI (Calculated) 24.8       Nutrition Therapy Plan and Nutrition Goals:     Nutrition Therapy & Goals - 08/29/15 1207    Nutrition Therapy   Diet Instructed on a meal plan based on 1800 calories including DASH diet principles.    Drug/Food Interactions Statins/Certain Fruits   Protein (specify units) 8   Fiber 30 grams   Whole Grain Foods 3 servings   Saturated Fats 12 max. grams   Fruits and Vegetables 5 servings/day   Sodium 1500 grams   Personal Nutrition Goals   Personal Goal #1 Increase fruits/vegetables with a goal of 5 servings per day.   Personal Goal #2 Try very low sodium tuna.   Personal Goal #3 When eating out, request that no salt be added.   Intervention Plan   Intervention Prescribe, educate and counsel regarding individualized specific dietary modifications aiming towards targeted core components such as weight, hypertension, lipid management, diabetes, heart failure and other comorbidities.;Nutrition handout(s) given to patient.   Expected Outcomes Short Term Goal: Understand basic principles of dietary content, such as calories, fat, sodium, cholesterol and nutrients.;Short Term Goal: A plan has been developed with personal nutrition goals set during dietitian appointment.;Long Term Goal: Adherence to prescribed nutrition plan.      Nutrition Discharge: Rate Your Plate Scores:     Nutrition Assessments - 10/09/15 1329    Rate Your Plate Scores   Post Score 69   Post Score % 77 %      Nutrition Goals Re-Evaluation:   Psychosocial: Target Goals: Acknowledge presence or absence of depression, maximize coping skills, provide positive support system. Participant is able to verbalize types and ability to use techniques and skills needed for reducing stress and depression.  Initial Review & Psychosocial Screening:     Initial Psych Review & Screening - 08/15/15 1418    Initial Review   Current issues with Current Sleep Concerns   Family Dynamics   Good Support System? Yes   Barriers   Psychosocial barriers to participate in program The patient should benefit from training in stress management and relaxation.   Screening Interventions   Interventions Encouraged to exercise       Quality of Life Scores:     Quality of Life - 10/09/15 1331    Quality of Life Scores   Health/Function Post 19 %   Socioeconomic Post 19.4 %   Psych/Spiritual Post 25.43 %   Family Post 13.8 %   GLOBAL Post 19.66 %      PHQ-9:     Recent Review Flowsheet Data    Depression screen [facility] 2/9 10/09/2015 08/15/2015   Decreased Interest 0 0   Down, Depressed, Hopeless 1 1   PHQ - 2 Score 1 1   Altered sleeping 3 3   Tired, decreased energy 0 1   Change in appetite 3 1   Feeling bad or failure about yourself  0 0   Trouble concentrating 0 0   Moving slowly or fidgety/restless 0 0   Suicidal thoughts 0 0   PHQ-9 Score 7 6   Difficult doing work/chores Not difficult at all Somewhat difficult      Psychosocial Evaluation and Intervention:     Psychosocial Evaluation - 08/26/15 0949    Psychosocial Evaluation & Interventions   Interventions Encouraged to  exercise with the program and follow exercise prescription;Relaxation education;Stress management education   Comments Counselor met with Mr. Coralyn Helling today for initial psychosocial evaluation.  He is a 55 year old who had triple bypass approximately one month ago.  He has a strong support system with a spouse of 7 years and (7) children several who are adults.  He also has his mother and father-in-law close by and is actively involved in his local faith community.  Mr. Coralyn Helling states that he is sleeping somewhat better since the surgery and has a goos appetite.  He admits to a history of undiagnosed/untreated depression as well as some current symptoms but his current mood is optimistic.  Mr. Coralyn Helling states that he is currently unable to work, but job stress is something he has experienced a great deal of, as well as his health currently is a stressor.  He has goals to be able to run on the treadmill before he completes this program in order to be able to run outside upon discharge.  He also enjoys walking outside.  Counselor recommended  monitoring Mr. Coralyn Helling to see if his mood overall is improved with consistent exercise and education in this program.  If not, Counselor will recommend a therapist or a medication evaluation at that time.     Continued Psychosocial Services Needed Yes  Mr. Coralyn Helling will benefit from the psychoeducational components of this program, especially stress management and depression, as well as consistent exercise.        Psychosocial Re-Evaluation:     Psychosocial Re-Evaluation      09/16/15 0955 09/25/15 0939         Psychosocial Re-Evaluation   Comments Follow up with Mr. Coralyn Helling reporting he is enjoying this program and the benefits of exercise in his life.  However, he continues to report that his mood has not improved much.  He agreed to speak with his doctor about this when he sees him on 4/12, and counselor will continue to follow with him about this. Mr. Coralyn Helling attended the depression education today at Cardiac Rehab and reported that he will definitely be speaking with his Dr. about his current mood and depressive symptoms next week.  He continues to report that his sleep is erratic and he mentioned that depression runs in his family.  Counselor commended Mr. Coralyn Helling for being proactive in this area and will continue to follow with him  concerning his mood.         Vocational Rehabilitation: Provide vocational rehab assistance to qualifying candidates.   Vocational Rehab Evaluation & Intervention:     Vocational Rehab - 08/15/15 1300    Initial Vocational Rehab Evaluation & Intervention   Assessment shows need for Vocational Rehabilitation No      Education: Education Goals: Education classes will be provided on a weekly basis, covering required topics. Participant will state understanding/return demonstration of topics presented.  Learning Barriers/Preferences:     Learning Barriers/Preferences - 08/15/15 1412    Learning Barriers/Preferences   Learning Barriers None   Learning  Preferences None      Education Topics: General Nutrition Guidelines/Fats and Fiber: -Group instruction provided by verbal, written material, models and posters to present the general guidelines for heart healthy nutrition. Gives an explanation and review of dietary fats and fiber.          Cardiac Rehab from 10/16/2015 in [facility] Cardiac and Pulmonary Rehab   Date  09/23/15   Educator  CR   Instruction Review Code  2- meets goals/outcomes      Controlling Sodium/Reading Food Labels: -Group verbal and written material supporting the discussion of sodium use in heart healthy nutrition. Review and explanation with models, verbal and written materials for utilization of the food label.      Cardiac Rehab from 10/16/2015 in [facility] Cardiac and Pulmonary Rehab   Date  09/30/15   Educator  C. Joneen Caraway, RD   Instruction Review Code  2- meets goals/outcomes      Exercise Physiology & Risk Factors: - Group verbal and written instruction with models to review the exercise physiology of the cardiovascular system and associated critical values. Details cardiovascular disease risk factors and the goals associated with each risk factor.      Cardiac Rehab from 10/16/2015 in [facility] Cardiac and Pulmonary Rehab   Date  10/09/15   Educator  BS   Instruction Review Code  2- meets goals/outcomes      Aerobic Exercise & Resistance Training: - Gives group verbal and written discussion on the health impact of inactivity. On the components of aerobic and resistive training programs and the benefits of this training and how to safely progress through these programs.      Cardiac Rehab from 10/16/2015 in [facility] Cardiac and Pulmonary Rehab   Date  10/14/15   Educator  RS   Instruction Review Code  2- meets goals/outcomes      Flexibility, Balance, General Exercise Guidelines: - Provides group verbal and written instruction on the benefits of flexibility and balance training programs. Provides general exercise  guidelines with specific guidelines to those with heart or lung disease. Demonstration and skill practice provided.      Cardiac Rehab from 10/16/2015 in [facility] Cardiac and Pulmonary Rehab   Date  10/16/15   Educator  bs   Instruction Review Code  2- meets goals/outcomes      Stress Management: - Provides group verbal and written instruction about the health risks of elevated stress, cause of high stress, and healthy ways to reduce stress.      Cardiac Rehab from 10/16/2015 in [facility] Cardiac and Pulmonary Rehab   Date  08/28/15   Educator  [facility]   Instruction Review Code  2- meets goals/outcomes      Depression: - Provides group verbal and written instruction on the correlation between heart/lung disease and depressed mood, treatment options, and the stigmas associated with seeking treatment.      Cardiac Rehab from 10/16/2015 in [facility] Cardiac and Pulmonary Rehab   Date  09/25/15   Educator  Elissa Hefty   Instruction Review Code  2- meets goals/outcomes      Anatomy & Physiology of the Heart: - Group verbal and written instruction and models provide basic cardiac anatomy and physiology, with the coronary electrical and arterial systems. Review of: AMI, Angina, Valve disease, Heart Failure, Cardiac Arrhythmia, Pacemakers, and the ICD.      Cardiac Rehab from 10/16/2015 in [facility] Cardiac and Pulmonary Rehab   Date  09/02/15   Educator  SB   Instruction Review Code  2- meets goals/outcomes      Cardiac Procedures: - Group verbal and written instruction and models to describe the testing methods done to diagnose heart disease. Reviews the outcomes of the test results. Describes the treatment choices: Medical Management, Angioplasty, or Coronary Bypass Surgery.      Cardiac Rehab from 10/16/2015 in [facility] Cardiac and Pulmonary Rehab   Date  09/09/15   Educator  SB   Instruction Review Code  2- meets goals/outcomes      Cardiac Medications: - Group verbal and written instruction to review  commonly prescribed medications for heart disease. Reviews the medication, class of the drug, and side effects. Includes the steps to properly store meds and maintain the prescription regimen.      Cardiac Rehab from 10/16/2015 in [facility] Cardiac and Pulmonary Rehab   Date  09/11/15   Educator  SB   Instruction Review Code  2- meets goals/outcomes      Go Sex-Intimacy & Heart Disease, Get SMART - Goal Setting: - Group verbal and written instruction through game format to discuss heart disease and the return to sexual intimacy. Provides group verbal and written material to discuss and apply goal setting through the application of the S.M.A.R.T. Method.      Cardiac Rehab from 10/16/2015 in [facility] Cardiac and Pulmonary Rehab   Date  09/09/15   Educator  SB   Instruction Review Code  2- meets goals/outcomes      Other Matters of the Heart: - Provides group verbal, written materials and models to describe Heart Failure, Angina, Valve Disease, and Diabetes in the realm of heart disease. Includes description of the disease process and treatment options available to the cardiac patient.   Exercise & Equipment Safety: - Individual verbal instruction and demonstration of equipment use and safety with use of the equipment.      Cardiac Rehab from 10/16/2015 in [facility] Cardiac and Pulmonary Rehab   Date  08/15/15   Educator  C. EnterkinRN   Instruction Review Code  1- partially meets, needs review/practice      Infection Prevention: - Provides verbal and written material to individual with discussion of infection control including proper hand washing and proper equipment cleaning during exercise session.      Cardiac Rehab from 10/16/2015 in [facility] Cardiac and Pulmonary Rehab   Date  08/15/15   Educator  C. EnterkinRN   Instruction Review Code  2- meets goals/outcomes      Falls Prevention: - Provides verbal and written material to individual with discussion of falls prevention and safety.       Cardiac Rehab from 10/16/2015 in [facility] Cardiac and Pulmonary Rehab   Date  08/15/15   Educator  C. Enterkin   Instruction Review Code  2- meets goals/outcomes      Diabetes: - Individual verbal and written instruction to review signs/symptoms of diabetes, desired ranges of glucose level fasting, after meals and with exercise. Advice that pre and post exercise glucose checks will be done for 3 sessions at entry of program.    Knowledge Questionnaire Score:     Knowledge Questionnaire Score - 10/09/15 1331    Knowledge Questionnaire Score   Post Score 28      Core Components/Risk Factors/Patient Goals at Admission:     Personal Goals and Risk Factors at Admission - 08/15/15 1417    Core Components/Risk Factors/Patient Goals on Admission   Tobacco Cessation Yes   Expected Outcomes Long Term: Complete abstinence from all tobacco products for at least 12 months from quit date.  Boris quit smoking his one pack per day for 34 years on Jul 22, 2015. Sparsh said his triggers are driving which he isn't able to drive until August 22, 2015.    Lipids Yes   Intervention Provide education and support for participant on nutrition & aerobic/resistive exercise along with prescribed medications to achieve LDL <60m, HDL >442m   Expected Outcomes Short Term: Participant states understanding  of desired cholesterol values and is compliant with medications prescribed. Participant is following exercise prescription and nutrition guidelines.;Long Term: Cholesterol controlled with medications as prescribed, with individualized exercise RX and with personalized nutrition plan. Value goals: LDL < 54m, HDL > 40 mg.      Core Components/Risk Factors/Patient Goals Review:      Goals and Risk Factor Review      09/12/15 1330 10/02/15 0928 10/02/15 0933 10/09/15 1332     Core Components/Risk Factors/Patient Goals Review   Personal Goals Review Hypertension Weight Management/Obesity;Tobacco  Cessation;Lipids;Hypertension      Review Review of chart shows that MManolitohas blood pressure control.  He has lost 2 pounds since starting the program MEwinis increasing exercise levels and following dietary recommendations.  He is taking medications for blood pressure and cholesterol as prescribed.  He has not smoked since January but is aware that returning to work may be a trigger for him.  [place]ame[place] may volunteer for [employer]     Expected Outcomes Continued maintanane of blood pressure.  MWillewill continue to demonstrate adherance to medication regimen and exercise and dietary recommendations. MEarnestinewill continue to demonstrate adherance to medication regimen and exercise and dietary recommendations.        Core Components/Risk Factors/Patient Goals at Discharge (Final Review):      Goals and Risk Factor Review - 10/09/15 1332    Core Components/Risk Factors/Patient Goals Review   Review MAnthanymay volunteer for [employer]       ITP Comments:     ITP Comments      08/15/15 1414 08/21/15 0717 09/12/15 1329 09/13/15 0903 10/02/15 0932   ITP Comments MElta Guadeloupesaid he feels his sternum pain sometimes especially when he coughs or sneezes but just sitting in a chair he has no pain. MCadeis able to drive March 2, [address] MDonathanquit smoking July 22, 2015.  30 day review   Cotinue with ITP      Has attended orientation, expecting to start sessions soon 30 Day Review. Continue with the ITP. MSalmansaid he was running on the pavement outside yesterday and his legs hurt some last night. He said he has good tennis shoes. He said this happens sometimes when he is running on pavement but he sayd he does well on the treadmill in here.  MTadashiis increasing exercise levels and following dietary recommendations.  He is taking medications for blood pressure and cholesterol as prescribed.  He has not smoked since January but is aware that returning to work may be a trigger for him.     10/02/15 0933 10/13/15 0930 10/14/15 1207 10/16/15  1203     ITP Comments MGerodwill continue to demonstrate adherance to medication regimen and exercise and dietary recommendations. 30 day review.  Continue with ITP Discharge expected Wednesday Discharged today       Comments:

## 2015-10-17 ENCOUNTER — Telehealth: Payer: Self-pay | Admitting: Cardiovascular Disease

## 2015-10-17 NOTE — Telephone Encounter (Signed)
Left message for pt that per Dr. Rockey Situ, we cannot draw PSA, as we are limited to cardiac related labs and if PSA were elevated, we cannot treat him.  Advised him that PCP can draw all labs, but we cannot. Asked him to call back w/ any other questions or concerns.

## 2015-10-17 NOTE — Telephone Encounter (Signed)
Patient called and wants to know if he can add a blood test for prostrate.  Patient is coming in tomorrow for labs.

## 2015-10-18 ENCOUNTER — Encounter: Payer: Self-pay | Admitting: Emergency Medicine

## 2015-10-18 ENCOUNTER — Other Ambulatory Visit
Admission: RE | Admit: 2015-10-18 | Discharge: 2015-10-18 | Disposition: A | Discharge status: Home / Self Care | Payer: 59 | Source: Ambulatory Visit | Attending: Cardiovascular Disease | Admitting: Cardiovascular Disease

## 2015-10-18 ENCOUNTER — Ambulatory Visit
Admission: EM | Admit: 2015-10-18 | Discharge: 2015-10-18 | Disposition: A | Discharge status: Home / Self Care | Payer: 59 | Attending: Family Medicine | Admitting: Family Medicine

## 2015-10-18 ENCOUNTER — Other Ambulatory Visit: Payer: 59

## 2015-10-18 DIAGNOSIS — E785 Hyperlipidemia, unspecified: Secondary | ICD-10-CM | POA: Diagnosis not present

## 2015-10-18 DIAGNOSIS — S8391XA Sprain of unspecified site of right knee, initial encounter: Secondary | ICD-10-CM

## 2015-10-18 LAB — BASIC METABOLIC PANEL
Anion gap: 10 (ref 5–15)
BUN: 26 mg/dL — ABNORMAL HIGH (ref 6–20)
CO2: 21 mmol/L — ABNORMAL LOW (ref 22–32)
Calcium: 9.3 mg/dL (ref 8.9–10.3)
Chloride: 110 mmol/L (ref 101–111)
Creatinine, Ser: 1.18 mg/dL (ref 0.61–1.24)
GFR calc Af Amer: >60 mL/min (ref >60)
GFR calc non Af Amer: >60 mL/min (ref >60)
Glucose, Bld: 93 mg/dL (ref 65–99)
Potassium: 4.2 mmol/L (ref 3.5–5.1)
Sodium: 141 mmol/L (ref 135–145)

## 2015-10-18 LAB — HEPATIC FUNCTION PANEL
ALT: 113 U/L — ABNORMAL HIGH (ref 17–63)
AST: 70 U/L — ABNORMAL HIGH (ref 15–41)
Albumin: 4.3 g/dL (ref 3.5–5.0)
Alkaline Phosphatase: 147 U/L — ABNORMAL HIGH (ref 38–126)
Bilirubin, Direct: <0.1 mg/dL — ABNORMAL LOW (ref 0.1–0.5)
Total Bilirubin: 0.5 mg/dL (ref 0.3–1.2)
Total Protein: 7.3 g/dL (ref 6.5–8.1)

## 2015-10-18 NOTE — ED Notes (Signed)
Patient c/o right knee pain that started after exercising this morning.

## 2015-10-18 NOTE — ED Provider Notes (Signed)
CSN: [id number]     Arrival date & time 10/18/15  1147 History   First MD Initiated Contact with Patient 10/18/15 1208     Chief Complaint  Patient presents with  . Knee Pain   (Consider location/radiation/quality/duration/timing/severity/associated sxs/prior Treatment) HPI Comments: 55 yo male presents with a c/o of right knee pain since this morning when he was walking/exercising and he twisted his knee. Denies falling or any direct trauma/injury. States he put some ice on it when he got home. Denies prior h/o right knee problems.   Patient is a 55 y.o. male presenting with knee pain. The history is provided by the patient.  Knee Pain   Past Medical History  Diagnosis Date  . Arthritis   . CAD (coronary artery disease)     a. nstemi 1.2017; b. cardiac cath 06/2015 with LM and severe 3 veseel dz (full report pending)  . HLD (hyperlipidemia)   . Tobacco abuse   . NSTEMI (non-ST elevated myocardial infarction) ([place]) 06/2015   Past Surgical History  Procedure Laterality Date  . Fractured toe Right 2013    great toe/with pin  . Cardiac catheterization Bilateral 07/23/2015    Procedure: Left Heart Cath and Coronary Angiography;  Surgeon: Minna Merritts, MD;  Location: [facility];  Service: Cardiovascular;  Laterality: Bilateral;  . Coronary artery bypass graft N/A 07/25/2015    Procedure: CORONARY ARTERY BYPASS GRAFTING X 3 UTILIZING BILATERAL IMA AND LEFT RADIAL ARTERY;  Surgeon: Melrose Nakayama, MD;  Location: [facility];  Service: Open Heart Surgery;  Laterality: N/A;  . Radial artery harvest Left 07/25/2015    Procedure: RADIAL ARTERY HARVEST;  Surgeon: Melrose Nakayama, MD;  Location: [facility];  Service: Open Heart Surgery;  Laterality: Left;  . Tee without cardioversion N/A 07/25/2015    Procedure: TRANSESOPHAGEAL ECHOCARDIOGRAM (TEE);  Surgeon: Melrose Nakayama, MD;  Location: [facility];  Service: Open Heart Surgery;  Laterality: N/A;   Family History  Problem Relation Age  of Onset  . Arthritis Mother     ra  . Heart attack Father 27  . Cancer Father   . Cancer Paternal Uncle     leukemia  . Colon cancer Neg Hx    Social History  Substance Use Topics  . Smoking status: Former Smoker -- 1.00 packs/day for 30 years    Types: Cigarettes    Quit date: 07/17/2015  . Smokeless tobacco: Never Used  . Alcohol Use: 0.0 oz/week    0 Standard drinks or equivalent per week     Comment: rare    Review of Systems  Allergies  Review of patient's allergies indicates no known allergies.  Home Medications   Prior to Admission medications   Medication Sig Start Date End Date Taking? Authorizing Provider  aspirin 81 MG tablet Take 1 tablet (81 mg total) by mouth daily. 10/07/15   Minna Merritts, MD  atorvastatin (LIPITOR) 80 MG tablet Take 1 tablet (80 mg total) by mouth daily at 6 PM. 07/31/15   Erin R Barrett, PA-C  clopidogrel (PLAVIX) 75 MG tablet Take 1 tablet (75 mg total) by mouth daily. 10/07/15   Minna Merritts, MD  metoprolol tartrate (LOPRESSOR) 25 MG tablet Take 0.5 tablets (12.5 mg total) by mouth 2 (two) times daily. 08/27/15   Melrose Nakayama, MD   Meds Ordered and Administered this Visit  Medications - No data to display  BP 105/68 mmHg  Pulse 55  Temp(Src) 97 F (36.1 C) (  Tympanic)  Resp 16  Ht 5\' 8"  (1.727 m)  Wt 164 lb (74.39 kg)  BMI 24.94 kg/m2  SpO2 100% No data found.   Physical Exam  Constitutional: He appears well-developed and well-nourished. No distress.  Musculoskeletal:       Right knee: He exhibits swelling (minimal). He exhibits normal range of motion, no effusion, no ecchymosis, no deformity, no laceration, no erythema, normal alignment, no LCL laxity, normal patellar mobility, no bony tenderness, normal meniscus and no MCL laxity. No tenderness found.  Skin: He is not diaphoretic.    ED Course  Procedures (including critical care time)  Labs Review Labs Reviewed - No data to display  Imaging Review No  results found.   Visual Acuity Review  Right Eye Distance:   Left Eye Distance:   Bilateral Distance:    Right Eye Near:   Left Eye Near:    Bilateral Near:         MDM   1. Right knee sprain, initial encounter    1. diagnosis reviewed with patient  2. rx as per orders above; reviewed possible side effects, interactions, risks and benefits  3. Recommend supportive treatment with otc analgesic/NSAID prn, rest, ice, elevation 4. Follow-up prn if symptoms worsen or don't improve    Norval Gable, MD 10/18/15 1249

## 2015-10-22 ENCOUNTER — Telehealth: Payer: Self-pay | Admitting: Cardiovascular Disease

## 2015-10-22 ENCOUNTER — Other Ambulatory Visit: Payer: Self-pay

## 2015-10-22 DIAGNOSIS — R748 Abnormal levels of other serum enzymes: Secondary | ICD-10-CM

## 2015-10-22 DIAGNOSIS — E785 Hyperlipidemia, unspecified: Secondary | ICD-10-CM

## 2015-10-22 NOTE — Telephone Encounter (Signed)
Please call patient as he would like to discuss his lab results that he received on My Chart.

## 2015-10-22 NOTE — Telephone Encounter (Signed)
Please see result note 

## 2015-11-22 ENCOUNTER — Other Ambulatory Visit (INDEPENDENT_AMBULATORY_CARE_PROVIDER_SITE_OTHER): Payer: 59 | Admitting: *Deleted

## 2015-11-22 DIAGNOSIS — E785 Hyperlipidemia, unspecified: Secondary | ICD-10-CM | POA: Diagnosis not present

## 2015-11-22 DIAGNOSIS — R748 Abnormal levels of other serum enzymes: Secondary | ICD-10-CM

## 2015-11-23 LAB — LIPID PANEL
Chol/HDL Ratio: 4.6 ratio units (ref 0.0–5.0)
Cholesterol, Total: 213 mg/dL — ABNORMAL HIGH (ref 100–199)
HDL: 46 mg/dL (ref >39)
LDL Calculated: 146 mg/dL — ABNORMAL HIGH (ref 0–99)
Triglycerides: 105 mg/dL (ref 0–149)
VLDL Cholesterol Cal: 21 mg/dL (ref 5–40)

## 2015-11-23 LAB — HEPATIC FUNCTION PANEL
ALT: 55 IU/L — ABNORMAL HIGH (ref 0–44)
AST: 32 IU/L (ref 0–40)
Albumin: 4.2 g/dL (ref 3.5–5.5)
Alkaline Phosphatase: 157 IU/L — ABNORMAL HIGH (ref 39–117)
Bilirubin Total: 0.2 mg/dL (ref 0.0–1.2)
Bilirubin, Direct: 0.08 mg/dL (ref 0.00–0.40)
Total Protein: 6.4 g/dL (ref 6.0–8.5)

## 2015-12-02 ENCOUNTER — Other Ambulatory Visit: Payer: Self-pay | Admitting: Thoracic Surgery (Cardiothoracic Vascular Surgery)

## 2015-12-03 ENCOUNTER — Telehealth: Payer: Self-pay | Admitting: Cardiovascular Disease

## 2015-12-03 NOTE — Telephone Encounter (Signed)
Pt states he is on blood thinner, and needs to go to the dentist. Please call and advise what he needs to do.

## 2015-12-03 NOTE — Telephone Encounter (Signed)
Spoke w/ Jeffrey Spence.  He reports that he is sched for routine dental procedure, he may end up having an extraction, but his has not been set up yet. Jeffrey Spence would also like to know if he can get a tattoo, as he has "been through a lot and feel that I deserve it". Spoke w/ Dr. Rockey Situ.  As Jeffrey Spence's NSTEMI & CABG were in 2/17, he recommends Jeffrey Spence not hold Plavix for 1 year.  Advised Jeffrey Spence that he may proceed w/ tattoo, but warned him that he may have more bleeding & bruising than normal. Advised him that if he is having 1 tooth pulled, he may stay on Plavix, but to make sure that dentist packs it well. He is appreciative and will await cholesterol med to be sent in (per result note).

## 2015-12-03 NOTE — Telephone Encounter (Signed)
Left message for pt to call back  °

## 2015-12-06 ENCOUNTER — Telehealth: Payer: Self-pay | Admitting: Cardiovascular Disease

## 2015-12-06 DIAGNOSIS — E785 Hyperlipidemia, unspecified: Secondary | ICD-10-CM

## 2015-12-06 NOTE — Telephone Encounter (Signed)
Would start Crestor 20 mg daily Start one half pill for the first several weeks then up to full pill Recheck lipid and liver in 3 months

## 2015-12-06 NOTE — Telephone Encounter (Signed)
Please see result note.  Pt is agreeable to taking statin, waiting to see which will be sent to pharmacy.

## 2015-12-06 NOTE — Telephone Encounter (Signed)
Patient called and wants to know what his new cholesterol medicine is going to be? Please call patient.

## 2015-12-09 MED ORDER — ROSUVASTATIN CALCIUM 20 MG PO TABS: 20.0000 mg | ORAL_TABLET | Freq: Every day | ORAL | Status: DC

## 2016-01-20 ENCOUNTER — Telehealth: Payer: Self-pay | Admitting: Cardiovascular Disease

## 2016-01-20 NOTE — Telephone Encounter (Signed)
Patient wants to know if he should fast for his fu appt.

## 2016-03-03 ENCOUNTER — Encounter: Payer: Self-pay | Admitting: Gastroenterology

## 2016-04-07 ENCOUNTER — Ambulatory Visit: Payer: 59 | Admitting: Cardiovascular Disease

## 2016-04-23 ENCOUNTER — Encounter: Payer: Self-pay | Admitting: Gastroenterology

## 2016-05-12 ENCOUNTER — Encounter: Payer: Self-pay | Admitting: Cardiovascular Disease

## 2016-05-12 ENCOUNTER — Ambulatory Visit (INDEPENDENT_AMBULATORY_CARE_PROVIDER_SITE_OTHER): Payer: 59 | Admitting: Cardiovascular Disease

## 2016-05-12 VITALS — BP 120/90 | HR 83 | Ht 68.0 in | Wt 176.8 lb

## 2016-05-12 DIAGNOSIS — I249 Acute ischemic heart disease, unspecified: Secondary | ICD-10-CM | POA: Diagnosis not present

## 2016-05-12 DIAGNOSIS — Z23 Encounter for immunization: Secondary | ICD-10-CM

## 2016-05-12 DIAGNOSIS — I200 Unstable angina: Secondary | ICD-10-CM

## 2016-05-12 DIAGNOSIS — I2510 Atherosclerotic heart disease of native coronary artery without angina pectoris: Secondary | ICD-10-CM

## 2016-05-12 DIAGNOSIS — I214 Non-ST elevation (NSTEMI) myocardial infarction: Secondary | ICD-10-CM

## 2016-05-12 DIAGNOSIS — F17200 Nicotine dependence, unspecified, uncomplicated: Secondary | ICD-10-CM

## 2016-05-12 DIAGNOSIS — E7800 Pure hypercholesterolemia, unspecified: Secondary | ICD-10-CM

## 2016-05-12 NOTE — Addendum Note (Signed)
Addended by: Dede Query R on: 05/12/2016 12:33 PM   Modules accepted: Orders

## 2016-05-12 NOTE — Patient Instructions (Signed)
Medication Instructions:   Please hold the crestor Stay on atorvastatin   Labwork:  We will check liver and lipids   Testing/Procedures:  No further testing at this time   I recommend watching educational videos on topics of interest to you at:       www.[email or web address].com  Enter code: HEARTCARE    Follow-Up: It was a pleasure seeing you in the office today. Please call us if you have new issues that need to be addressed before your next appt.  [phone]  Your physician wants you to follow-up in: 6 months.  You will receive a reminder letter in the mail two months in advance. If you don't receive a letter, please call our office to schedule the follow-up appointment.  If you need a refill on your cardiac medications before your next appointment, please call your pharmacy.

## 2016-05-12 NOTE — Progress Notes (Signed)
Cardiology Office Note  Date:  05/12/2016   ID:  Jeffrey Spence, DOB [birth year], MRN [id number]  PCP:  Arnette Norris, MD   Chief Complaint  Patient presents with  . other     6 month fu. Pt c/o SOB.  Reviewed meds with pt verbally.    HPI:  55 year old gentleman with long history of smoking,  history of coronary artery disease, bypass surgery 07/25/2015 after non-STEMI, postoperative atrial fibrillation who presents for routine follow-up of his CAD  In follow-up today he reports that he feels well He is Taking lipitor and crestor daily (both), some confusion He previously reported having liver problems on Lipitor and we changed him to Crestor in early June 2017. 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  underwent coronary bypass grafting 3 with bilateral mammaries and a left radial artery on 07/25/2015. Postoperatively he had atrial fibrillation. He converted to sinus rhythm with amiodarone. He did not require anticoagulation.   Cardiac catheterization report 80-90% ostial left main disease that did not improve with nitroglycerin IC 80% proximal LAD disease, long region with moderate calcification 80-90% proximal RCA disease, calcified, ulcerative plaque 70% proximal left circumflex disease Essentially normal LV gram, ejection fraction greater than 55 %   PMH:   has a past medical history of Arthritis; CAD (coronary artery disease); HLD (hyperlipidemia); NSTEMI (non-ST elevated myocardial infarction) ([facility] [place]) (06/2015); and Tobacco abuse.  PSH:    Past  Surgical History:  Procedure Laterality Date  . CARDIAC CATHETERIZATION Bilateral 07/23/2015   Procedure: Left Heart Cath and Coronary Angiography;  Surgeon: Minna Merritts, MD;  Location: [facility];  Service: Cardiovascular;  Laterality: Bilateral;  . CORONARY ARTERY BYPASS GRAFT N/A 07/25/2015   Procedure: CORONARY ARTERY BYPASS GRAFTING X 3 UTILIZING BILATERAL IMA AND LEFT RADIAL ARTERY;  Surgeon: Melrose Nakayama, MD;  Location: [facility];  Service: Open Heart Surgery;  Laterality: N/A;  . fractured toe Right 2013   great toe/with pin  . RADIAL ARTERY HARVEST Left 07/25/2015   Procedure: RADIAL ARTERY HARVEST;  Surgeon: Melrose Nakayama, MD;  Location: [facility];  Service: Open Heart Surgery;  Laterality: Left;  . TEE WITHOUT CARDIOVERSION N/A 07/25/2015   Procedure: TRANSESOPHAGEAL ECHOCARDIOGRAM (TEE);  Surgeon: Melrose Nakayama, MD;  Location: [facility];  Service: Open Heart Surgery;  Laterality: N/A;    Current Outpatient Prescriptions  Medication Sig Dispense Refill  . aspirin 81 MG tablet Take 1 tablet (81 mg total) by mouth daily.    Marland Kitchen atorvastatin (LIPITOR) 80 MG tablet Take 1 tablet (80 mg total) by mouth daily at 6 PM. 30 tablet 3  . clopidogrel (PLAVIX) 75 MG tablet Take 1 tablet (75 mg total) by mouth daily. 90 tablet 3  . Loratadine (CLARITIN PO) Take by mouth daily.    . metoprolol tartrate (LOPRESSOR) 25 MG tablet Take 0.5 tablets (12.5 mg total) by mouth 2 (two) times daily. 60 tablet 3   No current facility-administered medications for this visit.      Allergies:   Patient has no known allergies.   Social History:  The patient  reports that he quit smoking about 9 months ago. His smoking use included Cigarettes. He has a 30.00 pack-year smoking history. He has never used smokeless tobacco. He reports that he drinks alcohol. He reports that he does not use drugs.   Family History:   family history includes Arthritis in his mother; Cancer in his father and  paternal uncle; Heart attack (age of onset: 41) in his father.    Review of Systems: Review of Systems  Constitutional: Negative.   Respiratory: Negative.   Cardiovascular: Negative.   Gastrointestinal: Negative.   Musculoskeletal: Negative.   Neurological: Negative.   Psychiatric/Behavioral: Negative.   All other systems reviewed and are negative.    PHYSICAL EXAM: VS:  BP 120/90 (BP Location: Left Arm, Patient Position: Sitting, Cuff Size: Normal)   Pulse 83   Ht 5\' 8"  (1.727 m)   Wt 176 lb 12 oz (80.2 kg)   BMI 26.87 kg/m  , BMI Body mass index is 26.87 kg/m. GEN: Well nourished, well developed, in no acute distress  HEENT: normal  Neck: no JVD, carotid bruits, or masses Cardiac: RRR; no murmurs, rubs, or gallops,no edema  Respiratory:  clear to auscultation bilaterally, normal work of breathing GI: soft, nontender, nondistended, + BS MS: no deformity or atrophy  Skin: warm and dry, no rash Neuro:  Strength and sensation are intact Psych: euthymic mood, full affect    Recent Labs: 07/23/2015: B Natriuretic Peptide 76.0 07/27/2015: TSH 2.373 07/28/2015: Magnesium 2.1 08/16/2015: Hemoglobin 11.6; Platelets 333.0 10/18/2015: BUN 26; Creatinine, Ser 1.18; Potassium 4.2; Sodium 141 11/22/2015: ALT 55    Lipid Panel Lab Results  Component Value Date   CHOL 213 (H) 11/22/2015   HDL 46 11/22/2015   LDLCALC 146 (H) 11/22/2015   TRIG 105 11/22/2015      Wt Readings from Last 3 Encounters:  05/12/16 176 lb 12 oz (80.2 kg)  10/18/15 164 lb (74.4 kg)  10/07/15 166 lb 1.9 oz (75.4 kg)       ASSESSMENT AND PLAN:  NSTEMI (non-ST elevated myocardial infarction) ([facility]) - Plan: EKG 12-Lead, Hepatic function panel, Lipid Profile  Acute coronary syndrome ([facility]) - Plan: EKG 12-Lead, Hepatic function panel, Lipid Profile  Unstable angina pectoris ([place]) - Plan: EKG 12-Lead, Hepatic function panel, Lipid Profile Denies any symptoms concerning for angina. 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 No further workup at this time. Continue current medication regimen.  Pure hypercholesterolemia Unclear why he is taking Lipitor and Crestor Crestor is more expensive for him, we will hold this, continue Lipitor We will check liver and lipids today Repeat lab work in 6 months time on Lipitor alone  TOBACCO ABUSE Ports that he quit smoking after his surgery   Total encounter time more than 15 minutes  Greater than 50% was spent in counseling and coordination of care with the patient   Disposition:   F/U  6 months   Orders Placed This Encounter  Procedures  . Hepatic function panel  . Lipid Profile  . EKG 12-Lead     Signed, Esmond Plants, M.D., Ph.D. 05/12/2016  [place], [place]

## 2016-05-15 ENCOUNTER — Other Ambulatory Visit: Payer: Self-pay | Admitting: Thoracic Surgery (Cardiothoracic Vascular Surgery)

## 2016-05-28 ENCOUNTER — Telehealth: Payer: Self-pay | Admitting: Gastroenterology

## 2016-05-28 NOTE — Telephone Encounter (Signed)
Pt returning PV call. Asks to be called during the day before 3pm

## 2016-05-28 NOTE — Telephone Encounter (Signed)
Attempted pt at 307 pm, no answer. LM to return call and ask for PV  [facility]

## 2016-05-29 NOTE — Telephone Encounter (Signed)
Spoke with patient today -- he is on plavix after his NSTEMI 07-2015--I cancelled his PV 12-13 an dscheduled an OV with j. lemmon PA 12-15 Friday . Explained to pt needs OV to get okay to hold plavix prior to colonoscopy.  Pt verbalized understanding of this  Marijean Niemann

## 2016-06-05 ENCOUNTER — Encounter: Payer: Self-pay | Admitting: Physician Assistant

## 2016-06-05 ENCOUNTER — Ambulatory Visit (INDEPENDENT_AMBULATORY_CARE_PROVIDER_SITE_OTHER): Payer: 59 | Admitting: Physician Assistant

## 2016-06-05 ENCOUNTER — Encounter: Payer: Self-pay | Admitting: Gastroenterology

## 2016-06-05 VITALS — BP 124/80 | HR 72 | Ht 67.32 in | Wt 181.1 lb

## 2016-06-05 DIAGNOSIS — Z01818 Encounter for other preprocedural examination: Secondary | ICD-10-CM | POA: Diagnosis not present

## 2016-06-05 DIAGNOSIS — Z7901 Long term (current) use of anticoagulants: Secondary | ICD-10-CM

## 2016-06-05 DIAGNOSIS — Z86010 Personal history of colonic polyps: Secondary | ICD-10-CM

## 2016-06-05 MED ORDER — NA SULFATE-K SULFATE-MG SULF 17.5-3.13-1.6 GM/177ML PO SOLN: 1.0000 | ORAL | 0 refills | Status: DC

## 2016-06-05 NOTE — Progress Notes (Signed)
Reviewed and agree with management plan.  Nephtali Docken T. Korinna Tat, MD FACG 

## 2016-06-05 NOTE — Patient Instructions (Signed)
You have been scheduled for a colonoscopy. Please follow written instructions given to you at your visit today.  Please pick up your prep supplies at the pharmacy within the next 1-3 days. If you use inhalers (even only as needed), please bring them with you on the day of your procedure. Your physician has requested that you go to www.startemmi.com and enter the access code given to you at your visit today. This web site gives a general overview about your procedure. However, you should still follow specific instructions given to you by our office regarding your preparation for the procedure.  We have given you a high fiber diet handout. Please strive to have 25-30 mg of fiber daily.   Please purchase the following medications over the counter and take as directed: Align Probiotic once daily.  You should be drinking at least 6-8 8 oz glasses of water daily.

## 2016-06-05 NOTE — Progress Notes (Signed)
Chief Complaint: Pre-procedural exam prior to screening colo, hx polyps, chronic anticoagulation  HPI:  [name] is a 55 year old male with a past medical history of CAD, status post and STEMI in January 2017 and cardiac cath was severe 3 vessel disease, last echo 07/25/15 with an LVEF of 40-45%, hyperlipidemia, maintained on Plavix. who presents to clinic today for consult of a screening colonoscopy in a patient with history of colon polyps on chronic anticoagulation .     Patient is generally followed by Dr. Fuller Plan with his last colonoscopy performed 04/10/15. At that time patient had removal of 7 sessile polyps in the rectum, sigmoid and transverse colon as well as 10 sessile polyps in the rectum, descending and transverse colon as well as grade 1 internal hemorrhoids. Pathology revealed tubular adenomas. Repeat colonoscopy was recommended in 1 year after path received.   Today, the patient presents to clinic accompanied by his wife and tells me that he is due for repeat colonoscopy. He was placed on Plavix earlier this year after an NSTEMI and 3 stents. He has never had to come off of this medication before and it is prescribed by Dr. Rockey Situ in [place]. Patient and his wife also describe today that over the past couple of months the patient has been experiencing increased abdominal bloating, change in bowel habits towards constipation and abdominal tenseness/gain of belly fat. His wife describes that he recently changed work schedules going from night shift to now 5 days a week 8 hour shifts, apparently over this time his bowel habits have decreased. Patient tells me now has a bowel movement every other day and does notice an increase in bloating. He has not tried anything for this.   Patient denies fever, chills, blood in his stool, melena, weight loss, nausea, vomiting, heartburn, reflux or symptoms that awaken him at night.  Past Medical History:  Diagnosis Date  . Arthritis   . CAD (coronary  artery disease)    a. nstemi 1.2017; b. cardiac cath 06/2015 with LM and severe 3 veseel dz (full report pending)  . HLD (hyperlipidemia)   . NSTEMI (non-ST elevated myocardial infarction) ([place]) 06/2015  . Tobacco abuse     Past Surgical History:  Procedure Laterality Date  . CARDIAC CATHETERIZATION Bilateral 07/23/2015   Procedure: Left Heart Cath and Coronary Angiography;  Surgeon: Minna Merritts, MD;  Location: [facility];  Service: Cardiovascular;  Laterality: Bilateral;  . CORONARY ARTERY BYPASS GRAFT N/A 07/25/2015   Procedure: CORONARY ARTERY BYPASS GRAFTING X 3 UTILIZING BILATERAL IMA AND LEFT RADIAL ARTERY;  Surgeon: Melrose Nakayama, MD;  Location: [facility];  Service: Open Heart Surgery;  Laterality: N/A;  . fractured toe Right 2013   great toe/with pin  . RADIAL ARTERY HARVEST Left 07/25/2015   Procedure: RADIAL ARTERY HARVEST;  Surgeon: Melrose Nakayama, MD;  Location: [facility];  Service: Open Heart Surgery;  Laterality: Left;  . TEE WITHOUT CARDIOVERSION N/A 07/25/2015   Procedure: TRANSESOPHAGEAL ECHOCARDIOGRAM (TEE);  Surgeon: Melrose Nakayama, MD;  Location: [facility];  Service: Open Heart Surgery;  Laterality: N/A;    Current Outpatient Prescriptions  Medication Sig Dispense Refill  . aspirin 81 MG tablet Take 1 tablet (81 mg total) by mouth daily.    . clopidogrel (PLAVIX) 75 MG tablet Take 1 tablet (75 mg total) by mouth daily. 90 tablet 3  . Loratadine (CLARITIN PO) Take 1 tablet by mouth as needed.     . metoprolol tartrate (LOPRESSOR) 25  MG tablet TAKE 1 TABLET BY MOUTH TWICE DAILY (Patient taking differently: TAKE 1/2 TABLET BY MOUTH TWICE DAILY) 60 tablet 0   No current facility-administered medications for this visit.     Allergies as of 06/05/2016  . (No Known Allergies)    Family History  Problem Relation Age of Onset  . Heart attack Father 58  . Cancer Father   . Arthritis Mother     ra  . Cancer Paternal Uncle     leukemia  . Colon cancer  Neg Hx     Social History   Social History  . Marital status: Married    Spouse name: N/A  . Number of children: 7  . Years of education: N/A   Occupational History  .  [employer]   Social History Main Topics  . Smoking status: Former Smoker    Packs/day: 1.00    Years: 30.00    Types: Cigarettes    Quit date: 07/17/2015  . Smokeless tobacco: Never Used  . Alcohol use 0.0 oz/week     Comment: rare  . Drug use: No  . Sexual activity: Not on file   Other Topics Concern  . Not on file   Social History Narrative   Regular exercise--yes    Review of Systems:    Constitutional: No weight loss, fever, chills, weakness or fatigue Cardiovascular: No chest pain Respiratory: No SOB  Gastrointestinal: See HPI and otherwise negative   Physical Exam:  Vital signs: Ht 5' 7.32" (1.71 m) Comment: height measured without shoes  Wt 181 lb 2 oz (82.2 kg)   BMI 28.10 kg/m  BP 124/80 (BP Location: Left Arm, Patient Position: Sitting, Cuff Size: Normal)   Pulse 72   Ht 5' 7.32" (1.71 m) Comment: height measured without shoes  Wt 181 lb 2 oz (82.2 kg)   BMI 28.10 kg/m    Constitutional:   Pleasant Caucasian male appears to be in NAD, Well developed, Well nourished, alert and cooperative Respiratory: Respirations even and unlabored. Lungs clear to auscultation bilaterally.   No wheezes, crackles, or rhonchi.  Cardiovascular: Normal S1, S2. No MRG. Regular rate and rhythm. No peripheral edema, cyanosis or pallor.  Gastrointestinal:  Tight, mild distention, nontender No rebound or guarding. Normal bowel sounds. No appreciable masses or hepatomegaly. Psychiatric: Demonstrates good judgement and reason without abnormal affect or behaviors.  No recent labs or imaging.  Assessment: 1. Preprocedural exam due to patient being on chronic anticoagulation: Patient on Plavix daily after NSTEMI in January with stent placement, doing well 2. History of multiple adenomatous polyps:  Patient had history of multiple adenomatous polyps on past 2 colonoscopies, the last one was a year ago with recommendations for repeat in a year  Plan: 1. Patient is already scheduled for colonoscopy with Dr. Fuller Plan on December 27 in the [facility]. Reminded him of risks, benefits, limitations and alternatives of the patient agrees to proceed. 2. Recommend the patient start a probiotic after time of his colonoscopy. Patient was provided with Align coupons. Recommend he continue this for at least 1-2 months. 3. Recommend the patient increase fiber to at least 25-35 g per day through a supplement and/or through his diet. Also he should increase water intake to at least 6-8 8 ounce glasses of water per day 4. Patient was advised to hold his Plavix for 5 days prior to the procedure. We will communicate with his cardiologist Dr. Rockey Situ in [place] to ensure that holding his Plavix is safe for this  patient. 5. Patient to follow in clinic per Dr.Stark's recommendations after time procedure.  Ellouise Newer, PA-C [facility] 06/05/2016, 10:40 AM  Cc: Jeffrey Passy, MD

## 2016-06-11 ENCOUNTER — Telehealth: Payer: Self-pay | Admitting: Physician Assistant

## 2016-06-11 ENCOUNTER — Telehealth: Payer: Self-pay | Admitting: Emergency Medicine

## 2016-06-11 NOTE — Telephone Encounter (Signed)
06/11/2016   RE: Jeffrey Spence DOB: 1960-10-22 MRN: [id number]   Dear Dr. Rockey Situ,    We have scheduled the above patient for an endoscopic procedure. Our records show that he is on anticoagulation therapy.   Please advise as to how long the patient may come off his therapy of plavix prior to the procedure, which is scheduled for 06/17/16.  Please fax back/ or route the completed form to Tinnie Gens, [place].   Sincerely,    Tinnie Gens, [place]

## 2016-06-12 ENCOUNTER — Telehealth: Payer: Self-pay | Admitting: Cardiovascular Disease

## 2016-06-12 NOTE — Telephone Encounter (Signed)
Pt states he is having a colonoscopy on Tuesday. He asks when should he stop his Plavix. Please call.

## 2016-06-12 NOTE — Telephone Encounter (Signed)
Per GI notes, pt scheduled for 12/27 colonoscopy.  "4. Patient was advised to hold his Plavix for 5 days prior to the procedure. We will communicate with his cardiologist Dr. Rockey Situ in [place] to ensure that holding his Plavix is safe for this patient."  Pt had CABG 07/25/15. Reviewed w/Chris Sharolyn Douglas, NP, who agrees to hold plavix 5 days prior to procedure. He should continue aspirin 81mg . Reviewed instructions w/pt who verbalized understanding and repeated back to me.

## 2016-06-17 ENCOUNTER — Encounter: Payer: Self-pay | Admitting: Gastroenterology

## 2016-06-17 ENCOUNTER — Ambulatory Visit (AMBULATORY_SURGERY_CENTER): Payer: 59 | Admitting: Gastroenterology

## 2016-06-17 VITALS — BP 113/72 | HR 61 | Temp 98.6°F | Resp 15 | Ht 67.0 in | Wt 181.0 lb

## 2016-06-17 DIAGNOSIS — K635 Polyp of colon: Secondary | ICD-10-CM

## 2016-06-17 DIAGNOSIS — D120 Benign neoplasm of cecum: Secondary | ICD-10-CM

## 2016-06-17 DIAGNOSIS — D122 Benign neoplasm of ascending colon: Secondary | ICD-10-CM

## 2016-06-17 DIAGNOSIS — D123 Benign neoplasm of transverse colon: Secondary | ICD-10-CM | POA: Diagnosis not present

## 2016-06-17 DIAGNOSIS — Z860101 Personal history of adenomatous and serrated colon polyps: Secondary | ICD-10-CM

## 2016-06-17 DIAGNOSIS — Z86010 Personal history of colonic polyps: Secondary | ICD-10-CM | POA: Diagnosis present

## 2016-06-17 DIAGNOSIS — D125 Benign neoplasm of sigmoid colon: Secondary | ICD-10-CM | POA: Diagnosis not present

## 2016-06-17 MED ORDER — SODIUM CHLORIDE 0.9 % IV SOLN: 500.0000 mL | INTRAVENOUS | Status: DC

## 2016-06-17 NOTE — Op Note (Signed)
[facility] Patient Name: Jeffrey Spence Procedure Date: 06/17/2016 11:24 AM MRN: [id number] Endoscopist: Ladene Artist , MD Age: 55 Referring MD:  Date of Birth: 1961-06-09 Gender: Male Account #: [id number] Procedure:                Colonoscopy Indications:              Surveillance: History of numerous (> 10) adenomas                            on last colonoscopy (< 3 yrs) Medicines:                Monitored Anesthesia Care Procedure:                Pre-Anesthesia Assessment:                           - Prior to the procedure, a History and Physical                            was performed, and patient medications and                            allergies were reviewed. The patient's tolerance of                            previous anesthesia was also reviewed. The risks                            and benefits of the procedure and the sedation                            options and risks were discussed with the patient.                            All questions were answered, and informed consent                            was obtained. Prior Anticoagulants: The patient has                            taken no previous anticoagulant or antiplatelet                            agents. ASA Grade Assessment: III - A patient with                            severe systemic disease. After reviewing the risks                            and benefits, the patient was deemed in                            satisfactory condition to undergo the procedure.  After obtaining informed consent, the colonoscope                            was passed under direct vision. Throughout the                            procedure, the patient's blood pressure, pulse, and                            oxygen saturations were monitored continuously. The                            Model PCF-H190L [id number]) scope was introduced                            through the anus and  advanced to the the cecum,                            identified by appendiceal orifice and ileocecal                            valve. The ileocecal valve, appendiceal orifice,                            and rectum were photographed. The quality of the                            bowel preparation was good. The colonoscopy was                            performed without difficulty. The patient tolerated                            the procedure well. Scope In: 11:34:32 AM Scope Out: 11:53:14 AM Scope Withdrawal Time: 0 hours 16 minutes 34 seconds  Total Procedure Duration: 0 hours 18 minutes 42 seconds  Findings:                 The perianal and digital rectal examinations were                            normal.                           A 4 mm polyp was found in the cecum. The polyp was                            sessile. The polyp was removed with a cold biopsy                            forceps. Resection and retrieval were complete.                           Five sessile polyps were found in the sigmoid colon                            (  1), transverse colon (3) and ascending colon (1).                            The polyps were 6 to 8 mm in size. These polyps                            were removed with a cold snare. Resection and                            retrieval were complete.                           Internal hemorrhoids were found during                            retroflexion. The hemorrhoids were small and Grade                            I (internal hemorrhoids that do not prolapse).                           One small localized angiodysplastic lesion without                            bleeding was found in the cecum.                           The entire examined colon otherwise appeared normal                            on direct and retroflexion views. Complications:            No immediate complications. Estimated blood loss:                            None. Estimated  Blood Loss:     Estimated blood loss: none. Impression:               - One 4 mm polyp in the cecum, removed with a cold                            biopsy forceps. Resected and retrieved.                           - Five 6 to 8 mm polyps in the sigmoid colon (1),                            in the transverse colon (3) and in the ascending                            colon (1), removed with a cold snare. Resected and                            retrieved.                           -  Internal hemorrhoids.                           - One small non-bleeding colonic angiodysplastic                            lesion.                           - The entire examined colon is normal on direct and                            retroflexion views. Recommendation:           - Repeat colonoscopy in 2 years for surveillance                            unless genetics evaluation indicates a sooner                            interval                           - Resume Plavix (clopidogrel) tomorrow at prior                            dose. Refer to managing physician for further                            adjustment of therapy.                           - Patient has a contact number available for                            emergencies. The signs and symptoms of potential                            delayed complications were discussed with the                            patient. Return to normal activities tomorrow.                            Written discharge instructions were provided to the                            patient.                           - Resume previous diet.                           - Continue present medications.                           - Await pathology results. Ladene Artist, MD 06/17/2016 11:59:20 AM This report has  been signed electronically.

## 2016-06-17 NOTE — Progress Notes (Signed)
To recovery, report to [place], RN, VSS 

## 2016-06-17 NOTE — Progress Notes (Signed)
Called to room to assist during endoscopic procedure.  Patient ID and intended procedure confirmed with present staff. Received instructions for my participation in the procedure from the performing physician.  

## 2016-06-17 NOTE — Patient Instructions (Signed)
  RESUME PLAVIX TOMORROW   INFORMATION ON POLYPS & HEMORRHOIDS GIVEN TO YOU TODAY  RESUME USUAL DIET AND MEDICATIONS    REPEAT COLONOSCOPY IN 2 YEARS  GENETICS EVALUATION - DR Silvio Pate NURSE WILL CALL YOU AND SET THIS UP    YOU HAD AN ENDOSCOPIC PROCEDURE TODAY AT [facility]:   Refer to the procedure report that was given to you for any specific questions about what was found during the examination.  If the procedure report does not answer your questions, please call your gastroenterologist to clarify.  If you requested that your care partner not be given the details of your procedure findings, then the procedure report has been included in a sealed envelope for you to review at your convenience later.  YOU SHOULD EXPECT: Some feelings of bloating in the abdomen. Passage of more gas than usual.  Walking can help get rid of the air that was put into your GI tract during the procedure and reduce the bloating. If you had a lower endoscopy (such as a colonoscopy or flexible sigmoidoscopy) you may notice spotting of blood in your stool or on the toilet paper. If you underwent a bowel prep for your procedure, you may not have a normal bowel movement for a few days.  Please Note:  You might notice some irritation and congestion in your nose or some drainage.  This is from the oxygen used during your procedure.  There is no need for concern and it should clear up in a day or so.  SYMPTOMS TO REPORT IMMEDIATELY:   Following lower endoscopy (colonoscopy or flexible sigmoidoscopy):  Excessive amounts of blood in the stool  Significant tenderness or worsening of abdominal pains  Swelling of the abdomen that is new, acute  Fever of 100F or higher    For urgent or emergent issues, a gastroenterologist can be reached at any hour by calling [phone].   DIET:  We do recommend a small meal at first, but then you may proceed to your regular diet.  Drink plenty of fluids but you  should avoid alcoholic beverages for 24 hours.  ACTIVITY:  You should plan to take it easy for the rest of today and you should NOT DRIVE or use heavy machinery until tomorrow (because of the sedation medicines used during the test).    FOLLOW UP: Our staff will call the number listed on your records the next business day following your procedure to check on you and address any questions or concerns that you may have regarding the information given to you following your procedure. If we do not reach you, we will leave a message.  However, if you are feeling well and you are not experiencing any problems, there is no need to return our call.  We will assume that you have returned to your regular daily activities without incident.  If any biopsies were taken you will be contacted by phone or by letter within the next 1-3 weeks.  Please call us at [phone] if you have not heard about the biopsies in 3 weeks.    SIGNATURES/CONFIDENTIALITY: You and/or your care partner have signed paperwork which will be entered into your electronic medical record.  These signatures attest to the fact that that the information above on your After Visit Summary has been reviewed and is understood.  Full responsibility of the confidentiality of this discharge information lies with you and/or your care-partner.

## 2016-06-18 ENCOUNTER — Telehealth: Payer: Self-pay | Admitting: *Deleted

## 2016-06-18 NOTE — Telephone Encounter (Signed)
  Follow up Call-  Call back number 06/17/2016 04/10/2015  Post procedure Call Back phone  # [phone]  Permission to leave phone message Yes Yes  Some recent data might be hidden     Patient questions:  Do you have a fever, pain , or abdominal swelling? No. Pain Score  0 *  Have you tolerated food without any problems? Yes.    Have you been able to return to your normal activities? Yes.    Do you have any questions about your discharge instructions: Diet   No. Medications  No. Follow up visit  No.  Do you have questions or concerns about your Care? No.  Actions: * If pain score is 4 or above: No action needed, pain <4.

## 2016-06-24 ENCOUNTER — Other Ambulatory Visit: Payer: Self-pay | Admitting: Thoracic Surgery (Cardiothoracic Vascular Surgery)

## 2016-06-29 ENCOUNTER — Encounter: Payer: Self-pay | Admitting: Gastroenterology

## 2016-08-26 ENCOUNTER — Ambulatory Visit (INDEPENDENT_AMBULATORY_CARE_PROVIDER_SITE_OTHER): Payer: 59 | Admitting: Family Medicine

## 2016-08-26 ENCOUNTER — Encounter: Payer: Self-pay | Admitting: Family Medicine

## 2016-08-26 DIAGNOSIS — R140 Abdominal distension (gaseous): Secondary | ICD-10-CM | POA: Diagnosis not present

## 2016-08-26 LAB — COMPREHENSIVE METABOLIC PANEL
ALT: 16 U/L (ref 0–53)
AST: 13 U/L (ref 0–37)
Albumin: 4.2 g/dL (ref 3.5–5.2)
Alkaline Phosphatase: 128 U/L — ABNORMAL HIGH (ref 39–117)
BUN: 16 mg/dL (ref 6–23)
CO2: 25 mEq/L (ref 19–32)
Calcium: 9.6 mg/dL (ref 8.4–10.5)
Chloride: 106 mEq/L (ref 96–112)
Creatinine, Ser: 1.04 mg/dL (ref 0.40–1.50)
GFR: 78.69 mL/min (ref >60.00)
Glucose, Bld: 99 mg/dL (ref 70–99)
Potassium: 3.7 mEq/L (ref 3.5–5.1)
Sodium: 140 mEq/L (ref 135–145)
Total Bilirubin: 0.6 mg/dL (ref 0.2–1.2)
Total Protein: 6.7 g/dL (ref 6.0–8.3)

## 2016-08-26 LAB — H. PYLORI ANTIBODY, IGG: H Pylori IgG: NEGATIVE

## 2016-08-26 LAB — LIPASE: Lipase: 32 U/L (ref 11.0–59.0)

## 2016-08-26 NOTE — Assessment & Plan Note (Signed)
Bloating without pain or other red flag symptoms. Agree that probiotic would likely be helpful.  He will try an OTC probiotic like Align or Culturelle. Will rule out H Pylori today. Also discussed trying Gas X. Call or return to clinic prn if these symptoms worsen or fail to improve as anticipated. The patient indicates understanding of these issues and agrees with the plan.

## 2016-08-26 NOTE — Progress Notes (Signed)
Pre visit review using our clinic review tool, if applicable. No additional management support is needed unless otherwise documented below in the visit note. 

## 2016-08-26 NOTE — Progress Notes (Signed)
Subjective:   Patient ID: Jeffrey Spence, male    DOB: 01-15-1961, 56 y.o.   MRN: [id number]  Jeffrey Spence is a pleasant 56 y.o. year old male who presents to clinic today with Abdominal Pain (Epigastric bloating without pain  for "awhile". He has not tried anything for it.)  on 08/26/2016  HPI:  Abdominal bloating for past several months. Colonoscopy 06/17/16 - Dr. Fuller Plan, 2 year recall.  Per pt, told Dr. Fuller Plan about his bloating in 05/2016 and was told to try probiotics. He tried [place] but has not tried any other probiotics.  Denies abdominal pain, just feels "tight" and distended.  No nausea or vomiting.  He is not sure if certain foods make it worse.  No changes in his diet. Current Outpatient Prescriptions on File Prior to Visit  Medication Sig Dispense Refill  . aspirin 81 MG tablet Take 1 tablet (81 mg total) by mouth daily.    . clopidogrel (PLAVIX) 75 MG tablet Take 1 tablet (75 mg total) by mouth daily. 90 tablet 3  . Loratadine (CLARITIN PO) Take 1 tablet by mouth as needed.     . metoprolol tartrate (LOPRESSOR) 25 MG tablet TAKE 1 TABLET BY MOUTH TWICE DAILY (Patient taking differently: TAKE 1/2 TABLET BY MOUTH TWICE DAILY) 60 tablet 0   Current Facility-Administered Medications on File Prior to Visit  Medication Dose Route Frequency Provider Last Rate Last Dose  . 0.9 %  sodium chloride infusion  500 mL Intravenous Continuous Ladene Artist, MD        No Known Allergies  Past Medical History:  Diagnosis Date  . Arthritis   . CAD (coronary artery disease)    a. nstemi 1.2017; b. cardiac cath 06/2015 with LM and severe 3 veseel dz (full report pending)  . HLD (hyperlipidemia)   . NSTEMI (non-ST elevated myocardial infarction) ([facility]) 06/2015  . Tobacco abuse     Past Surgical History:  Procedure Laterality Date  . CARDIAC CATHETERIZATION Bilateral 07/23/2015   Procedure: Left Heart Cath and Coronary Angiography;  Surgeon: Minna Merritts, MD;  Location: [facility];  Service: Cardiovascular;  Laterality: Bilateral;  . CORONARY ARTERY BYPASS GRAFT N/A 07/25/2015   Procedure: CORONARY ARTERY BYPASS GRAFTING X 3 UTILIZING BILATERAL IMA AND LEFT RADIAL ARTERY;  Surgeon: Melrose Nakayama, MD;  Location: [facility];  Service: Open Heart Surgery;  Laterality: N/A;  . fractured toe Right 2013   great toe/with pin  . RADIAL ARTERY HARVEST Left 07/25/2015   Procedure: RADIAL ARTERY HARVEST;  Surgeon: Melrose Nakayama, MD;  Location: [facility];  Service: Open Heart Surgery;  Laterality: Left;  . TEE WITHOUT CARDIOVERSION N/A 07/25/2015   Procedure: TRANSESOPHAGEAL ECHOCARDIOGRAM (TEE);  Surgeon: Melrose Nakayama, MD;  Location: [facility];  Service: Open Heart Surgery;  Laterality: N/A;    Family History  Problem Relation Age of Onset  . Heart attack Father 43  . Cancer Father     blood  . Rheum arthritis Mother   . Leukemia Paternal Uncle   . Colon cancer Neg Hx     Social History   Social History  . Marital status: Married    Spouse name: N/A  . Number of children: 7  . Years of education: N/A   Occupational History  .  [employer]   Social History Main Topics  . Smoking status: Former Smoker    Packs/day: 1.00    Years: 30.00    Types:  Cigarettes    Quit date: 07/17/2015  . Smokeless tobacco: Never Used  . Alcohol use 0.0 oz/week     Comment: rare  . Drug use: No  . Sexual activity: Not on file   Other Topics Concern  . Not on file   Social History Narrative   Regular exercise--yes   The PMH, PSH, Social History, Family History, Medications, and allergies have been reviewed in [facility], and have been updated if relevant.   Review of Systems  Constitutional: Negative for activity change, appetite change and fever.  Gastrointestinal: Positive for abdominal distention. Negative for abdominal pain, anal bleeding, blood in stool, constipation, diarrhea, nausea, rectal pain and vomiting.  Neurological: Negative.   All  other systems reviewed and are negative.      Objective:    BP 112/74 (BP Location: Left Arm, Patient Position: Sitting, Cuff Size: Large)   Pulse 91   Temp 97.8 F (36.6 C) (Oral)   Wt 179 lb (81.2 kg)   SpO2 97%   BMI 28.04 kg/m    Physical Exam  Constitutional: He is oriented to person, place, and time. He appears well-developed and well-nourished. No distress.  HENT:  Head: Normocephalic and atraumatic.  Eyes: Conjunctivae are normal.  Cardiovascular: Normal rate.   Pulmonary/Chest: Effort normal.  Abdominal: Soft. He exhibits distension. He exhibits no mass. There is no tenderness. There is no rebound and no guarding.  Musculoskeletal: Normal range of motion.  Neurological: He is alert and oriented to person, place, and time. No cranial nerve deficit.  Skin: Skin is warm and dry. He is not diaphoretic.  Psychiatric: He has a normal mood and affect. His behavior is normal. Judgment and thought content normal.  Nursing note and vitals reviewed.         Assessment & Plan:   Abdominal bloating - Plan: H. pylori antibody, IgG, Lipase, Comprehensive metabolic panel No Follow-up on file.

## 2016-08-26 NOTE — Patient Instructions (Signed)
Great to see you.  Try Gas X and Align if you can afford it.  I'm sorry that I could not find any samples.  I will call you with your results from today.

## 2016-09-16 ENCOUNTER — Other Ambulatory Visit: Payer: Self-pay | Admitting: Thoracic Surgery (Cardiothoracic Vascular Surgery)

## 2016-09-20 ENCOUNTER — Other Ambulatory Visit: Payer: Self-pay | Admitting: Cardiovascular Disease

## 2016-10-19 ENCOUNTER — Other Ambulatory Visit: Payer: Self-pay | Admitting: Cardiovascular Disease

## 2016-10-20 ENCOUNTER — Other Ambulatory Visit: Payer: Self-pay | Admitting: Cardiovascular Disease

## 2016-10-20 NOTE — Telephone Encounter (Signed)
Pt needs f/u appt with Gollan. Thanks 

## 2016-10-20 NOTE — Telephone Encounter (Signed)
Pt needs f/u appt with Dr.Gollan. Thanks!

## 2016-10-21 NOTE — Telephone Encounter (Signed)
Lmov for patient to call back He is due for appointment with Dr Rockey Situ for refills

## 2016-10-21 NOTE — Telephone Encounter (Signed)
Lmov for patient to call back needs a f/u appointment with Dr Rockey Situ for refills

## 2016-10-27 NOTE — Telephone Encounter (Signed)
Lmov for patient to call back needs a f/u appointment with Dr Rockey Situ for refills

## 2016-10-29 NOTE — Telephone Encounter (Signed)
Number is not taking calls at the moment  Needed to schedule appointment with Dr Rockey Situ

## 2016-11-03 ENCOUNTER — Encounter: Payer: Self-pay | Admitting: Cardiovascular Disease

## 2016-11-03 NOTE — Telephone Encounter (Signed)
Unable to contact Sent letter to patient

## 2016-11-20 ENCOUNTER — Other Ambulatory Visit: Payer: Self-pay | Admitting: Cardiovascular Disease

## 2016-11-20 ENCOUNTER — Other Ambulatory Visit: Payer: Self-pay | Admitting: *Deleted

## 2016-11-20 ENCOUNTER — Telehealth: Payer: Self-pay | Admitting: Cardiovascular Disease

## 2016-11-20 MED ORDER — CLOPIDOGREL BISULFATE 75 MG PO TABS: 75.0000 mg | ORAL_TABLET | Freq: Every day | ORAL | 0 refills | Status: DC

## 2016-11-20 NOTE — Telephone Encounter (Signed)
°*  STAT* If patient is at the pharmacy, call can be transferred to refill team.   1. Which medications need to be refilled? (please list name of each medication and dose if known) Plavix 75 po daily  2. Which pharmacy/location (including street and city if local pharmacy) is medication to be sent to?*[facility] [address] [place]    3. Do they need a 30 day or 90 day supply? [place]

## 2016-11-20 NOTE — Telephone Encounter (Signed)
Plavix sent to local pharmacy 30 day supply. Pt needs to schedule appointment.

## 2016-11-24 NOTE — Telephone Encounter (Signed)
Pt scheduled for 12/29/16 to see Dr Rockey Situ

## 2016-11-25 NOTE — Telephone Encounter (Signed)
Thank you :)

## 2016-12-04 IMAGING — DX DG CHEST 2V
2 series · 2 of 2 positions shown · non-contrast
Comparison: 07/29/2015

CLINICAL DATA: Chest pain

EXAM:
CHEST  2 VIEW

[w chest pa]
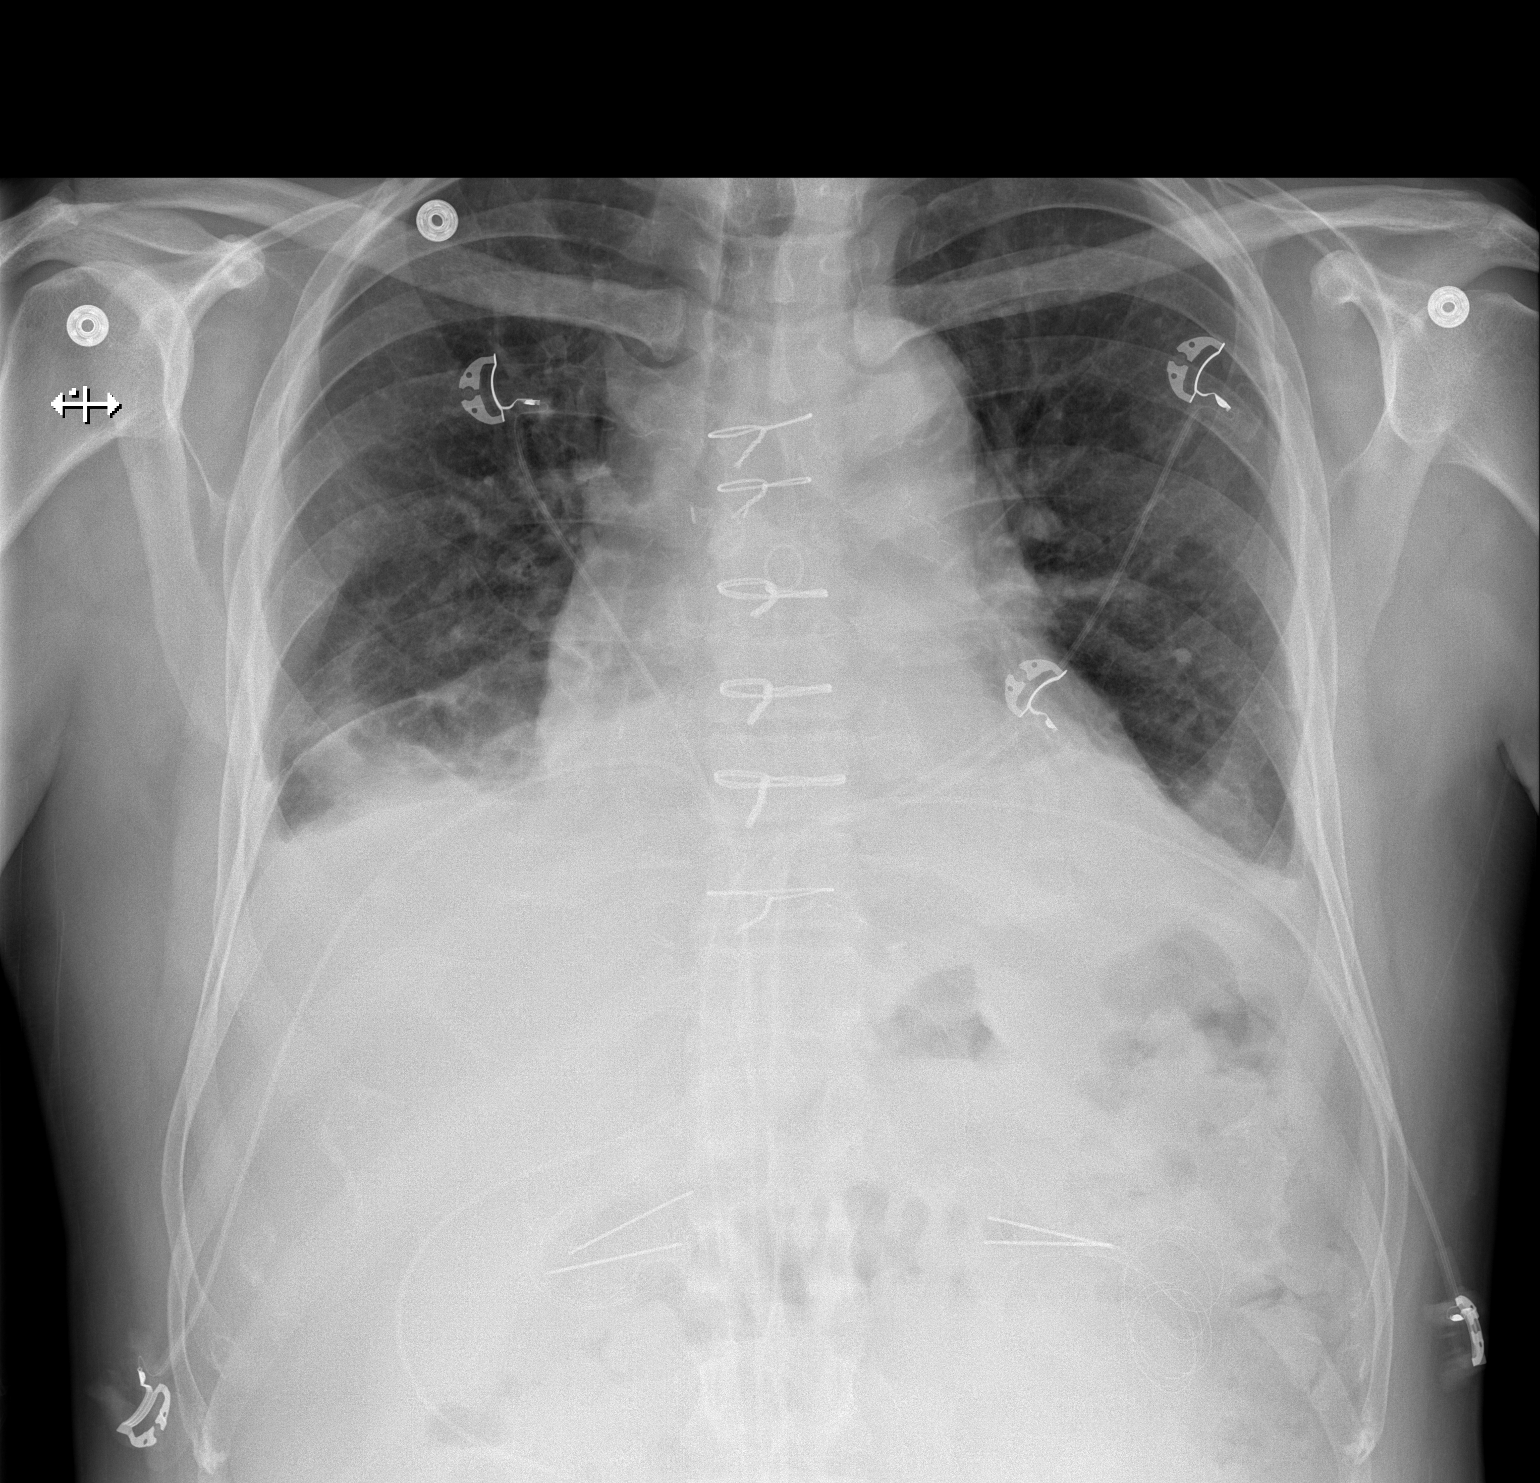

[w chest lat]
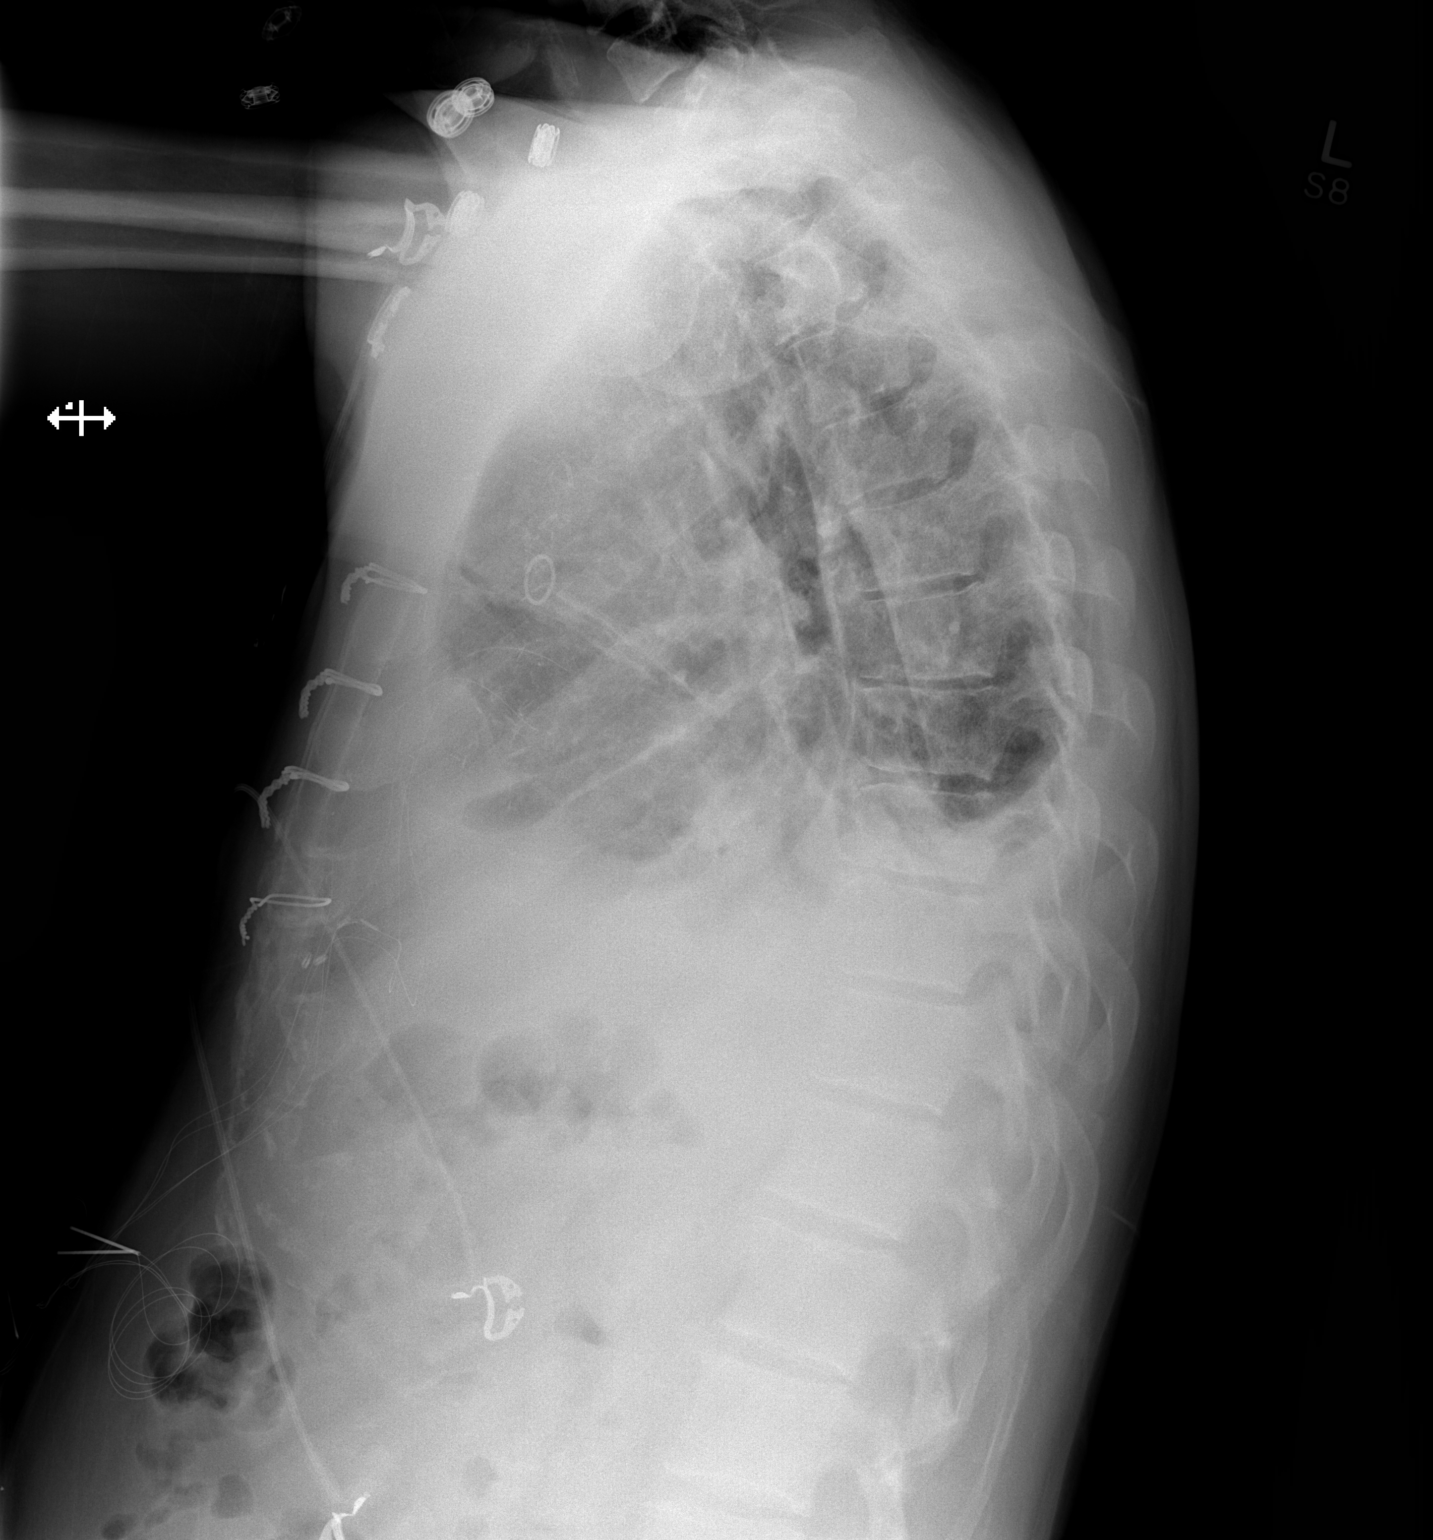

[2 of 2 positions shown; findings below may reference images not displayed]

FINDINGS: Cardiac shadow is again enlarged but stable. Postsurgical changes
are again noted. Bibasilar atelectatic changes with small effusions
are noted. The overall appearance is stable from the prior study. No
new bony abnormality is noted.
IMPRESSION: Stable bibasilar changes.  No new focal abnormality is noted.

## 2016-12-27 NOTE — Progress Notes (Signed)
Cardiology Office Note  Date:  12/29/2016   ID:  Jeffrey Spence, DOB 02-27-1961, MRN [id number]  PCP:  Lucille Passy, MD   Chief Complaint  Patient presents with  . other    6 month f/u no complaints today. Meds reviewed verbally with pt.    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  underwent coronary bypass grafting 3 with bilateral mammaries and a left radial artery on 07/25/2015. Postoperatively he had atrial fibrillation. He converted to sinus rhythm with amiodarone. He did not require anticoagulation.   Cardiac catheterization report 80-90% ostial left main disease that did not improve with nitroglycerin IC 80% proximal LAD disease, long region with moderate calcification 80-90% proximal RCA disease, calcified, ulcerative plaque 70% proximal left circumflex disease Essentially normal LV gram, ejection fraction greater than 55 %   PMH:   has a past medical history of Arthritis; CAD (coronary artery disease); HLD (hyperlipidemia); NSTEMI (non-ST elevated myocardial infarction) ([place]) (06/2015); and Tobacco  abuse.  PSH:    Past Surgical History:  Procedure Laterality Date  . CARDIAC CATHETERIZATION Bilateral 07/23/2015   Procedure: Left Heart Cath and Coronary Angiography;  Surgeon: Minna Merritts, MD;  Location: [facility];  Service: Cardiovascular;  Laterality: Bilateral;  . CORONARY ARTERY BYPASS GRAFT N/A 07/25/2015   Procedure: CORONARY ARTERY BYPASS GRAFTING X 3 UTILIZING BILATERAL IMA AND LEFT RADIAL ARTERY;  Surgeon: Melrose Nakayama, MD;  Location: [place];  Service: Open Heart Surgery;  Laterality: N/A;  . fractured toe Right 2013   great toe/with pin  . RADIAL ARTERY HARVEST Left 07/25/2015   Procedure: RADIAL ARTERY HARVEST;  Surgeon: Melrose Nakayama, MD;  Location: [place] [facility];  Service: Open Heart Surgery;  Laterality: Left;  . TEE WITHOUT CARDIOVERSION N/A 07/25/2015   Procedure: TRANSESOPHAGEAL ECHOCARDIOGRAM (TEE);  Surgeon: Melrose Nakayama, MD;  Location: [place];  Service: Open Heart Surgery;  Laterality: N/A;    Current Outpatient Prescriptions  Medication Sig Dispense Refill  . aspirin 81 MG tablet Take 1 tablet (81 mg total) by mouth daily.    . clopidogrel (PLAVIX) 75 MG tablet TAKE 1 TABLET BY MOUTH EVERY DAY 90 tablet 0  . Loratadine (CLARITIN PO) Take 1 tablet by mouth as needed.     . metoprolol tartrate (LOPRESSOR) 25 MG tablet TAKE 1 TABLET BY MOUTH TWICE DAILY 60 tablet 3   Current Facility-Administered Medications  Medication Dose Route Frequency Provider Last Rate Last Dose  . 0.9 %  sodium chloride infusion  500 mL Intravenous Continuous Ladene Artist, MD         Allergies:  Patient has no known allergies.   Social History:  The patient  reports that he quit smoking about 17 months ago. His smoking use included Cigarettes. He has a 30.00 pack-year smoking history. He has never used smokeless tobacco. He reports that he drinks alcohol. He reports that he does not use drugs.   Family History:   family history includes Cancer in his father;  Heart attack (age of onset: 62) in his father; Leukemia in his paternal uncle; Rheum arthritis in his mother.    Review of Systems: Review of Systems  Constitutional: Negative.   Respiratory: Negative.   Cardiovascular: Negative.   Gastrointestinal: Negative.   Musculoskeletal: Negative.   Neurological: Negative.   Psychiatric/Behavioral: Negative.   All other systems reviewed and are negative.    PHYSICAL EXAM: VS:  BP 106/80 (BP Location: Left Arm, Patient Position: Sitting, Cuff Size: Normal)   Pulse 84   Ht 5\' 7"  (1.702 m)   Wt 171 lb 4 oz (77.7 kg)   BMI 26.82 kg/m  , BMI Body mass index is 26.82 kg/m. GEN: Well nourished, well developed, in no acute distress  HEENT: normal  Neck: no JVD, carotid bruits, or masses Cardiac: RRR; no murmurs, rubs, or gallops,no edema  Respiratory:  clear to auscultation bilaterally, normal work of breathing GI: soft, nontender, nondistended, + BS MS: no deformity or atrophy  Skin: warm and dry, no rash Neuro:  Strength and sensation are intact Psych: euthymic mood, full affect    Recent Labs: 08/26/2016: ALT 16; BUN 16; Creatinine, Ser 1.04; Potassium 3.7; Sodium 140    Lipid Panel Lab Results  Component Value Date   CHOL 213 (H) 11/22/2015   HDL 46 11/22/2015   LDLCALC 146 (H) 11/22/2015   TRIG 105 11/22/2015      Wt Readings from Last 3 Encounters:  12/29/16 171 lb 4 oz (77.7 kg)  08/26/16 179 lb (81.2 kg)  06/17/16 181 lb (82.1 kg)       ASSESSMENT AND PLAN:  NSTEMI (non-ST elevated myocardial infarction) ([place]) -  Denies any symptoms concerning for angina Continue aspirin, Plavix, beta blocker, Will restart Crestor 20 mg daily  Coronary artery disease involving left main coronary artery -  Currently with no symptoms of angina. No further workup at this time. Continue current medication regimen.   hypercholesterolemia We have renewed his Crestor 20 mg daily Liver and lipid in 3 months time  TOBACCO  ABUSE Quit smoking 2 years ago   Total encounter time more than 25 minutes  Greater than 50% was spent in counseling and coordination of care with the patient   Disposition:   F/U  12 months   No orders of the defined types were placed in this encounter.    Signed, Esmond Plants, M.D., Ph.D. 12/29/2016  [place], [place]

## 2016-12-29 ENCOUNTER — Ambulatory Visit (INDEPENDENT_AMBULATORY_CARE_PROVIDER_SITE_OTHER): Payer: BLUE CROSS/BLUE SHIELD | Admitting: Cardiovascular Disease

## 2016-12-29 ENCOUNTER — Encounter: Payer: Self-pay | Admitting: Cardiovascular Disease

## 2016-12-29 VITALS — BP 106/80 | HR 84 | Ht 67.0 in | Wt 171.2 lb

## 2016-12-29 DIAGNOSIS — I2510 Atherosclerotic heart disease of native coronary artery without angina pectoris: Secondary | ICD-10-CM | POA: Diagnosis not present

## 2016-12-29 DIAGNOSIS — E782 Mixed hyperlipidemia: Secondary | ICD-10-CM | POA: Diagnosis not present

## 2016-12-29 DIAGNOSIS — F17200 Nicotine dependence, unspecified, uncomplicated: Secondary | ICD-10-CM | POA: Diagnosis not present

## 2016-12-29 DIAGNOSIS — Z79899 Other long term (current) drug therapy: Secondary | ICD-10-CM

## 2016-12-29 DIAGNOSIS — I208 Other forms of angina pectoris: Secondary | ICD-10-CM

## 2016-12-29 DIAGNOSIS — Z951 Presence of aortocoronary bypass graft: Secondary | ICD-10-CM

## 2016-12-29 MED ORDER — CLOPIDOGREL BISULFATE 75 MG PO TABS: 75.0000 mg | ORAL_TABLET | Freq: Every day | ORAL | 4 refills | Status: DC

## 2016-12-29 MED ORDER — ROSUVASTATIN CALCIUM 20 MG PO TABS: 20.0000 mg | ORAL_TABLET | Freq: Every day | ORAL | 3 refills | Status: DC

## 2016-12-29 MED ORDER — METOPROLOL TARTRATE 25 MG PO TABS: 25.0000 mg | ORAL_TABLET | Freq: Two times a day (BID) | ORAL | 3 refills | Status: DC

## 2016-12-29 NOTE — Patient Instructions (Addendum)
Medication Instructions:   Please start crestor one a day  Labwork:  Liver and lipids in 3 months, mid October fasting  Testing/Procedures:  No further testing at this time   Follow-Up: It was a pleasure seeing you in the office today. Please call us if you have new issues that need to be addressed before your next appt.  [phone]  Your physician wants you to follow-up in: 12 months.  You will receive a reminder letter in the mail two months in advance. If you don't receive a letter, please call our office to schedule the follow-up appointment.  If you need a refill on your cardiac medications before your next appointment, please call your pharmacy.

## 2017-01-06 ENCOUNTER — Telehealth: Payer: Self-pay | Admitting: Cardiovascular Disease

## 2017-01-06 MED ORDER — METOPROLOL TARTRATE 25 MG PO TABS: 12.5000 mg | ORAL_TABLET | Freq: Two times a day (BID) | ORAL | 3 refills | Status: DC

## 2017-01-06 NOTE — Telephone Encounter (Signed)
Pt calling wanting to make sure how he is to take his metoprolol,   He Thinks he is to take half of 25 mg twice a day When he went to pick it up it said on bottle 25 mg twice a day  Please advise.

## 2017-01-06 NOTE — Telephone Encounter (Signed)
Spoke w/ pt.  He reports that he has been on metoprolol 12.5 mg bid for some time now. At ov 12/29/16, refill was sent in for 25 mg BID. He reports BP 106/80 at that ov, this is his baseline.  BP will probably not support a whole pill BID, so recommended he continue on 1/2 pill BID and I will update his med list.  Asked him to continue to monitor and call back w/ any further questions or concerns.

## 2017-02-05 IMAGING — MR MR SHOULDER*L* W/O CM
5 series · 40 of 40 positions shown · non-contrast
Comparison: Chest radiographs 08/27/2015. Left shoulder radiographs
07/19/2015

CLINICAL DATA: Left shoulder limited range of motion with abduction
for 1 year. No acute injury, pain or prior relevant surgery.

EXAM:
MRI OF THE LEFT SHOULDER WITHOUT CONTRAST
TECHNIQUE: Multiplanar, multisequence MR imaging of the shoulder was performed.
No intravenous contrast was administered.

[Series 3: T2 fat-sat · axial · 4.0mm · 0.47mm/px · z∈[-32,+74]mm · 8 of 25 slices shown (1 of 3)]
[im 1/25]
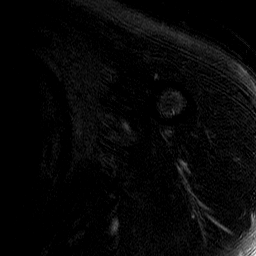
[im 4/25]
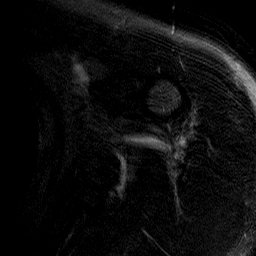
[im 7/25]
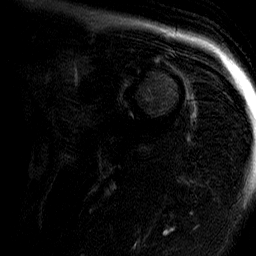
[im 11/25]
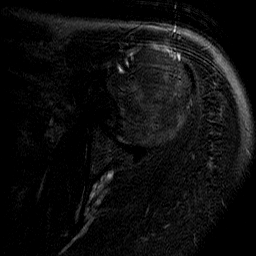
[im 14/25]
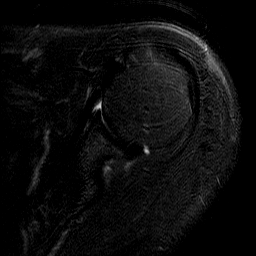
[im 18/25]
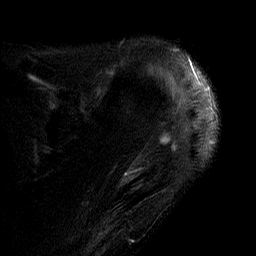
[im 21/25]
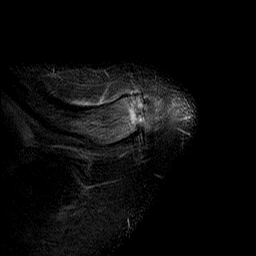
[im 25/25]
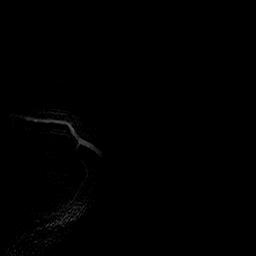

[Series 4: T2 fat-sat · oblique · 4.0mm · 0.62mm/px · 8 of 23 slices shown (2 of 3)]
[im 1/23]
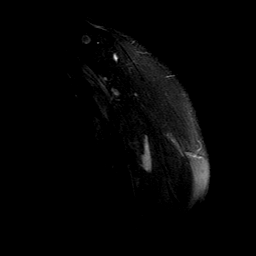
[im 4/23]
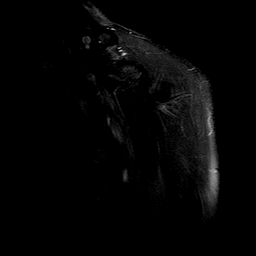
[im 7/23]
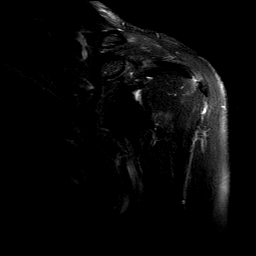
[im 10/23]
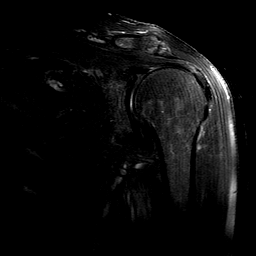
[im 13/23]
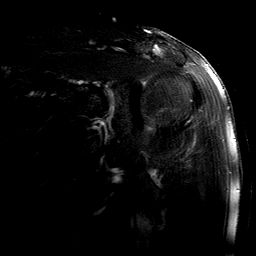
[im 16/23]
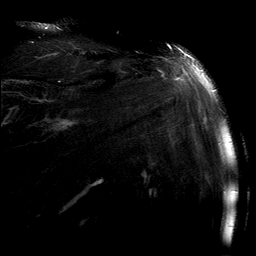
[im 19/23]
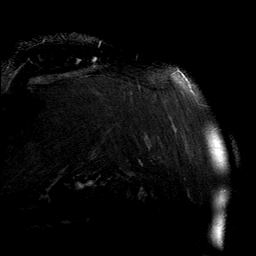
[im 23/23]
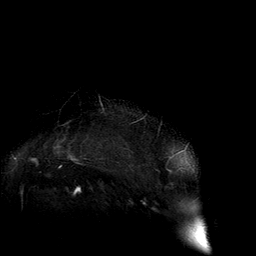

[Series 6: T1 · oblique · 4.0mm · 0.62mm/px · 8 of 23 slices shown]
[im 1/23]
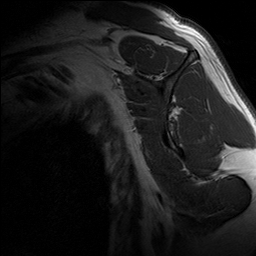
[im 4/23]
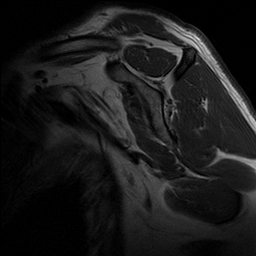
[im 7/23]
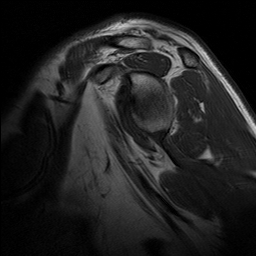
[im 10/23]
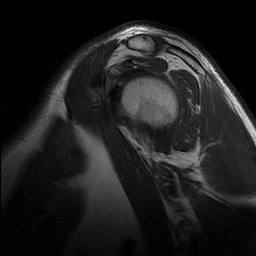
[im 13/23]
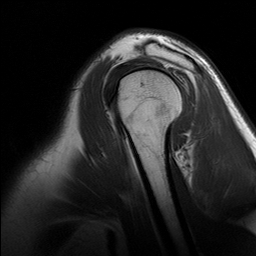
[im 16/23]
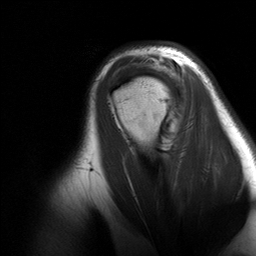
[im 19/23]
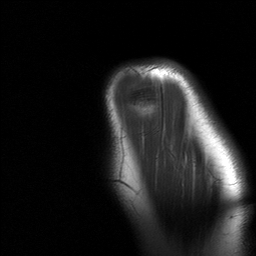
[im 23/23]
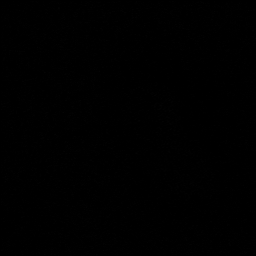

[Series 7: T2 fat-sat · oblique · 4.0mm · 0.62mm/px · 8 of 23 slices shown (3 of 3)]
[im 1/23]
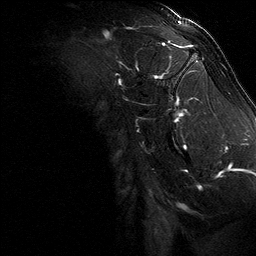
[im 4/23]
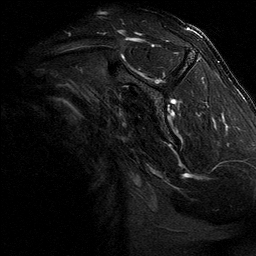
[im 7/23]
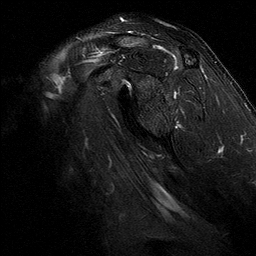
[im 10/23]
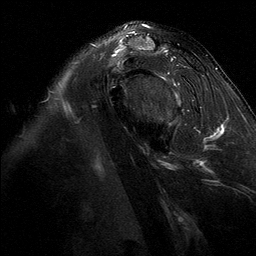
[im 13/23]
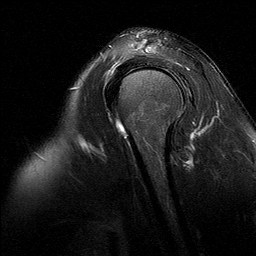
[im 16/23]
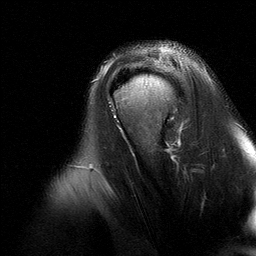
[im 19/23]
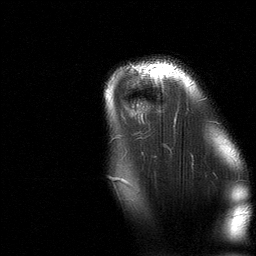
[im 23/23]
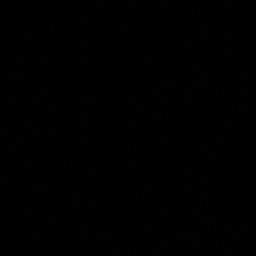

[Series 8: PD · oblique · 4.0mm · 0.62mm/px · 8 of 23 slices shown]
[im 1/23]
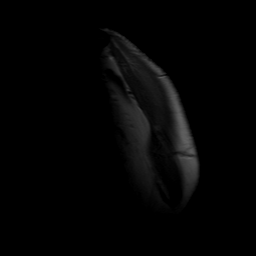
[im 4/23]
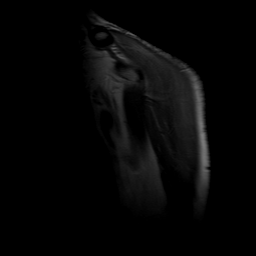
[im 7/23]
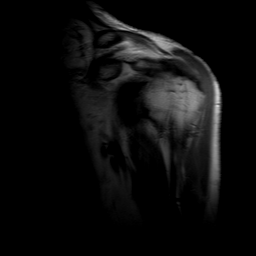
[im 10/23]
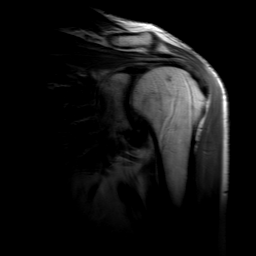
[im 13/23]
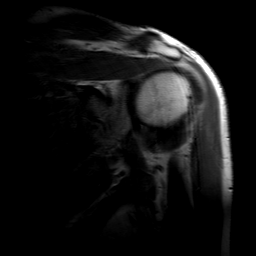
[im 16/23]
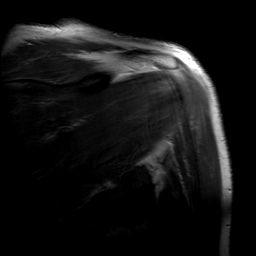
[im 19/23]
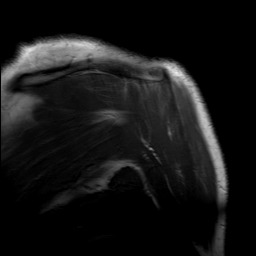
[im 23/23]
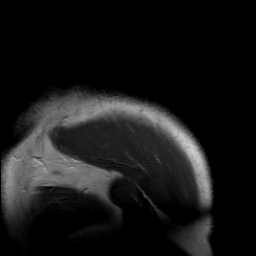

[40 of 40 positions shown; findings below may reference images not displayed]

FINDINGS: Despite efforts by the technologist and patient, motion artifact is
present on today's exam and could not be eliminated. This reduces
exam sensitivity and specificity.

Rotator cuff: Rotator cuff assessment is limited by the motion.
There is supraspinatus tendinosis with at least high-grade partial
tearing, best seen on coronal images 16 and 17 of series 4. A small
full-thickness tear cannot be excluded, although there is no
significant tendon retraction. The subscapularis, infraspinatus and
teres minor tendons demonstrate no significant findings.

Muscles:  No focal muscular atrophy or edema.

Biceps long head:  Intact and normally positioned.

Acromioclavicular Joint: The acromion is type 2. There are moderate
acromioclavicular degenerative changes. No significant fluid is
present in the subacromial - subdeltoid bursa.

Glenohumeral Joint: No significant shoulder joint effusion or
glenohumeral arthropathy.The joint capsule appears thickened in the
axillary recess, best seen on coronal images. There is no
significant capsular edema.

Labrum: Evaluation limited by motion and lack of joint fluid. No
evidence of labral tear or paralabral cyst.

Bones: No acute or significant extra-articular osseous findings.
IMPRESSION: 1. Moderate motion artifact limits intra-articular detail.
2. Supraspinatus tendinosis with at least high-grade partial
insertional tearing. No tendon retraction or focal muscular atrophy.
3. The biceps tendon and labrum appear intact.
4. Moderate acromioclavicular degenerative changes.

## 2017-03-14 ENCOUNTER — Emergency Department: Payer: Worker's Compensation

## 2017-03-14 ENCOUNTER — Emergency Department
Admission: EM | Admit: 2017-03-14 | Discharge: 2017-03-14 | Disposition: A | Discharge status: Home / Self Care | Payer: Worker's Compensation | Attending: Emergency Medicine | Admitting: Emergency Medicine

## 2017-03-14 DIAGNOSIS — Z951 Presence of aortocoronary bypass graft: Secondary | ICD-10-CM | POA: Insufficient documentation

## 2017-03-14 DIAGNOSIS — S81812A Laceration without foreign body, left lower leg, initial encounter: Secondary | ICD-10-CM | POA: Diagnosis not present

## 2017-03-14 DIAGNOSIS — Z7982 Long term (current) use of aspirin: Secondary | ICD-10-CM | POA: Insufficient documentation

## 2017-03-14 DIAGNOSIS — Y9289 Other specified places as the place of occurrence of the external cause: Secondary | ICD-10-CM | POA: Diagnosis not present

## 2017-03-14 DIAGNOSIS — I252 Old myocardial infarction: Secondary | ICD-10-CM | POA: Insufficient documentation

## 2017-03-14 DIAGNOSIS — Z87891 Personal history of nicotine dependence: Secondary | ICD-10-CM | POA: Diagnosis not present

## 2017-03-14 DIAGNOSIS — I2510 Atherosclerotic heart disease of native coronary artery without angina pectoris: Secondary | ICD-10-CM | POA: Insufficient documentation

## 2017-03-14 DIAGNOSIS — Z79899 Other long term (current) drug therapy: Secondary | ICD-10-CM | POA: Diagnosis not present

## 2017-03-14 DIAGNOSIS — W01198A Fall on same level from slipping, tripping and stumbling with subsequent striking against other object, initial encounter: Secondary | ICD-10-CM | POA: Diagnosis not present

## 2017-03-14 DIAGNOSIS — Z7902 Long term (current) use of antithrombotics/antiplatelets: Secondary | ICD-10-CM | POA: Diagnosis not present

## 2017-03-14 DIAGNOSIS — Y990 Civilian activity done for income or pay: Secondary | ICD-10-CM | POA: Diagnosis not present

## 2017-03-14 DIAGNOSIS — Z23 Encounter for immunization: Secondary | ICD-10-CM | POA: Diagnosis not present

## 2017-03-14 DIAGNOSIS — R52 Pain, unspecified: Secondary | ICD-10-CM

## 2017-03-14 DIAGNOSIS — Y9389 Activity, other specified: Secondary | ICD-10-CM | POA: Insufficient documentation

## 2017-03-14 MED ORDER — LIDOCAINE HCL (PF) 1 % IJ SOLN
5.0000 mL | Freq: Once | INTRAMUSCULAR | Status: AC
  Administered 2017-03-14: 5 mL
  Filled 2017-03-14: qty 5

## 2017-03-14 MED ORDER — TETANUS-DIPHTH-ACELL PERTUSSIS 5-2.5-18.5 LF-MCG/0.5 IM SUSP
0.5000 mL | Freq: Once | INTRAMUSCULAR | Status: AC
  Administered 2017-03-14: 0.5 mL via INTRAMUSCULAR
  Filled 2017-03-14: qty 0.5

## 2017-03-14 NOTE — ED Provider Notes (Signed)
[facility] Emergency Department Provider Note  ____________________________________________   First MD Initiated Contact with Patient 03/14/17 [phone]     (approximate)  I have reviewed the triage vital signs and the nursing notes.   HISTORY  Chief Complaint Laceration   HPI Jeffrey Spence is a 56 y.o. male is here with an abrasion and laceration to his left leg. Patient fell at work on a metal grate. He denies any head injury or loss of consciousness. He does complain of some left knee pain. He is unaware of the last time he had a tetanus booster. Currently he rates his pain as a 2/10.  Past Medical History:  Diagnosis Date  . Arthritis   . CAD (coronary artery disease)    a. nstemi 1.2017; b. cardiac cath 06/2015 with LM and severe 3 veseel dz (full report pending)  . HLD (hyperlipidemia)   . NSTEMI (non-ST elevated myocardial infarction) ([place]) 06/2015  . Tobacco abuse     Patient Active Problem List   Diagnosis Date Noted  . Abdominal bloating 08/26/2016  . Hyperlipidemia 10/07/2015  . Coronary artery disease involving native coronary artery of native heart with unstable angina pectoris ([place])   . S/P CABG x 3 07/25/2015  . NSTEMI (non-ST elevated myocardial infarction) ([place])   . Acute coronary syndrome ([place])   . Unstable angina pectoris ([place])   . Smoker   . Coronary artery disease involving left main coronary artery   . Chest pain 07/22/2015  . Swelling of arm 01/20/2013  . Routine general medical examination at a health care facility 01/12/2013  . OA (osteoarthritis) of knee 01/12/2013  . TESTICULAR HYPOFUNCTION 08/14/2010  . TOBACCO ABUSE 08/12/2010    Past Surgical History:  Procedure Laterality Date  . CARDIAC CATHETERIZATION Bilateral 07/23/2015   Procedure: Left Heart Cath and Coronary Angiography;  Surgeon: Minna Merritts, MD;  Location: [facility];  Service: Cardiovascular;  Laterality: Bilateral;  . CORONARY ARTERY  BYPASS GRAFT N/A 07/25/2015   Procedure: CORONARY ARTERY BYPASS GRAFTING X 3 UTILIZING BILATERAL IMA AND LEFT RADIAL ARTERY;  Surgeon: Melrose Nakayama, MD;  Location: [facility];  Service: Open Heart Surgery;  Laterality: N/A;  . fractured toe Right 2013   great toe/with pin  . RADIAL ARTERY HARVEST Left 07/25/2015   Procedure: RADIAL ARTERY HARVEST;  Surgeon: Melrose Nakayama, MD;  Location: [facility];  Service: Open Heart Surgery;  Laterality: Left;  . TEE WITHOUT CARDIOVERSION N/A 07/25/2015   Procedure: TRANSESOPHAGEAL ECHOCARDIOGRAM (TEE);  Surgeon: Melrose Nakayama, MD;  Location: [facility];  Service: Open Heart Surgery;  Laterality: N/A;    Prior to Admission medications   Medication Sig Start Date End Date Taking? Authorizing Provider  aspirin 81 MG tablet Take 1 tablet (81 mg total) by mouth daily. 10/07/15   Minna Merritts, MD  clopidogrel (PLAVIX) 75 MG tablet Take 1 tablet (75 mg total) by mouth daily. 12/29/16   Minna Merritts, MD  Loratadine (CLARITIN PO) Take 1 tablet by mouth as needed.     [provider]  metoprolol tartrate (LOPRESSOR) 25 MG tablet Take 0.5 tablets (12.5 mg total) by mouth 2 (two) times daily. 01/06/17   Minna Merritts, MD  rosuvastatin (CRESTOR) 20 MG tablet Take 1 tablet (20 mg total) by mouth daily. 12/29/16   Minna Merritts, MD    Allergies Patient has no known allergies.  Family History  Problem Relation Age of Onset  . Heart attack  Father 66  . Cancer Father        blood  . Rheum arthritis Mother   . Leukemia Paternal Uncle   . Colon cancer Neg Hx     Social History Social History  Substance Use Topics  . Smoking status: Former Smoker    Packs/day: 1.00    Years: 30.00    Types: Cigarettes    Quit date: 07/17/2015  . Smokeless tobacco: Never Used  . Alcohol use 0.0 oz/week     Comment: rare    Review of Systems Constitutional: No fever/chills Cardiovascular: Denies chest pain. Respiratory: Denies shortness of  breath. Gastrointestinal:   No nausea, no vomiting.  Musculoskeletal: Positive for left leg pain. Skin: Positive for laceration and abrasion left leg. Neurological: Negative for headaches, focal weakness or numbness. ___________________________________________   PHYSICAL EXAM:  VITAL SIGNS: ED Triage Vitals  Enc Vitals Group     BP 03/14/17 0138 (!) 146/86     Pulse Rate 03/14/17 0138 95     Resp 03/14/17 0138 18     Temp 03/14/17 0138 98.1 F (36.7 C)     Temp Source 03/14/17 0138 Oral     SpO2 03/14/17 0138 98 %     Weight 03/14/17 0138 170 lb (77.1 kg)     Height 03/14/17 0138 5\' 7"  (1.702 m)     Head Circumference --      Peak Flow --      Pain Score 03/14/17 0139 2     Pain Loc --      Pain Edu? --      Excl. in [place]? --    Constitutional: Alert and oriented. Well appearing and in no acute distress. Eyes: Conjunctivae are normal.  Head: Atraumatic. Nose: No trauma. Neck: No stridor.   Cardiovascular: Normal rate, regular rhythm. Grossly normal heart sounds.  Good peripheral circulation. Respiratory: Normal respiratory effort.  No retractions. Lungs CTAB. Musculoskeletal: Examination of the left leg there is moderate tenderness on palpation of the anterior left knee without effusion. Range of motion is slightly restricted due to discomfort and laceration. There is tenderness on palpation in the area of the abrasion and laceration. Bleeding is controlled with pressure. Motor sensory function intact distal to the injury. Neurologic:  Normal speech and language. No gross focal neurologic deficits are appreciated. No gait instability. Skin:  Skin is warm, dry.  Abrasion noted anterior left lower leg. 4 cm laceration to the anterior portion of the lower left leg. No active bleeding and no foreign body was seen. Psychiatric: Mood and affect are normal. Speech and behavior are normal.  ____________________________________________   LABS (all labs ordered are listed, but only  abnormal results are displayed)  Labs Reviewed - No data to display ____________________________________________  RADIOLOGY  Dg Tibia/fibula Left  Result Date: 03/14/2017 CLINICAL DATA:  Fall onto knee EXAM: LEFT TIBIA AND FIBULA - 2 VIEW COMPARISON:  None. FINDINGS: There is no evidence of fracture or other focal bone lesions. There is marked prepatellar soft tissue swelling. Pretibial laceration. IMPRESSION: Marked prepatellar soft tissue swelling without acute osseous abnormality. Electronically Signed   By: Ulyses Jarred M.D.   On: 03/14/2017 03:06    ____________________________________________   PROCEDURES  Procedure(s) performed: LACERATION REPAIR Performed by: Johnn Hai Authorized by: Johnn Hai Consent: Verbal consent obtained. Risks and benefits: risks, benefits and alternatives were discussed Consent given by: patient Patient identity confirmed: provided demographic data Prepped and Draped in normal sterile fashion Wound explored  Laceration Location: Left anterior lower leg  Laceration Length: 4 cm  No Foreign Bodies seen or palpated  Anesthesia: local infiltration  Local anesthetic: lidocaine 1 % without epinephrine  Anesthetic total: 3.5 ml  Irrigation method: syringe Amount of cleaning: standard  Skin closure: 4-0 Ethilon   Number of sutures: 5   Technique: Simple interrupted   Patient tolerance: Patient tolerated the procedure well with no immediate complications.  Procedures  Critical Care performed: No  ____________________________________________   INITIAL IMPRESSION / ASSESSMENT AND PLAN / ED COURSE  Pertinent labs & imaging results that were available during my care of the patient were reviewed by me and considered in my medical decision making (see chart for details).  Patient tolerated procedure well. The patient is to watch area for any signs of infection and was instructed to have sutures removed in 10-14 days. He is  to clean with mild soap and water daily and watch for any signs of infection. Tylenol as needed for pain. Out of date for the remainder of the day.    ____________________________________________   FINAL CLINICAL IMPRESSION(S) / ED DIAGNOSES  Final diagnoses:  Laceration of left leg excluding thigh, initial encounter      NEW MEDICATIONS STARTED DURING THIS VISIT:  Discharge Medication List as of 03/14/2017  8:24 AM       Note:  This document was prepared using Dragon voice recognition software and may include unintentional dictation errors.    Johnn Hai, PA-C 03/14/17 1111    Lisa Roca, MD 03/14/17 [phone]

## 2017-03-14 NOTE — ED Notes (Signed)
Supervisor Leida Lauth gave authorization to use our chain of custody form. Pt/ Pt's supervisor did not bring their own [place] form

## 2017-03-14 NOTE — ED Notes (Signed)
Dry pressure dressing applied with ace wrap

## 2017-03-14 NOTE — ED Notes (Signed)
Patient reports he was at work and slipped and fell on grating.  Patient with laceration noted to left lower leg, bleeding controlled with dressing.

## 2017-03-14 NOTE — Discharge Instructions (Signed)
Follow-up with your primary care doctor, urgent care, or Doctor  of your company's choice for suture removal. Suture removal in 10-14 days. Keep area clean and dry. Clean daily with mild soap and water and watch for signs of infection. Keep covered while at work. Elevate leg when possible to prevent swelling. Tylenol as needed for pain.

## 2017-03-14 NOTE — ED Notes (Signed)
Patient transported to X-ray 

## 2017-03-14 NOTE — ED Triage Notes (Signed)
Patient fell today at work onto left knee. Patient has approx 2" laceration to left lower leg, swelling of left knee.

## 2017-03-31 ENCOUNTER — Ambulatory Visit (INDEPENDENT_AMBULATORY_CARE_PROVIDER_SITE_OTHER): Payer: BLUE CROSS/BLUE SHIELD | Admitting: Family Medicine

## 2017-03-31 ENCOUNTER — Encounter: Payer: Self-pay | Admitting: Family Medicine

## 2017-03-31 VITALS — BP 114/72 | HR 79 | Temp 97.7°F | Wt 179.2 lb

## 2017-03-31 DIAGNOSIS — Z4802 Encounter for removal of sutures: Secondary | ICD-10-CM

## 2017-03-31 DIAGNOSIS — Z23 Encounter for immunization: Secondary | ICD-10-CM | POA: Diagnosis not present

## 2017-03-31 DIAGNOSIS — M25462 Effusion, left knee: Secondary | ICD-10-CM

## 2017-03-31 NOTE — Progress Notes (Signed)
Subjective:    Patient ID: Jeffrey Spence, male    DOB: March 27, 1961, 56 y.o.   MRN: [id number]  HPI This is a 56 yo male who presents today with left knee pain and swelling. He was seen 03/14/17 at ED with laceration and abrasion following a fall at work. XRay showed soft tissue swelling without osseous abnormality. Five sutures were placed for laceration. Leg feels swollen and tight. Continues ot have swelling over kneecap. No pain with activity or rest. Swelling has decreased, but still there over knee cap. He works 12 hour shifts, standing/walking on concrete floor, climbs stairs and drives fork lift. Has been taking acetaminophen 2x/day with good relief.   Past Medical History:  Diagnosis Date  . Arthritis   . CAD (coronary artery disease)    a. nstemi 1.2017; b. cardiac cath 06/2015 with LM and severe 3 veseel dz (full report pending)  . HLD (hyperlipidemia)   . NSTEMI (non-ST elevated myocardial infarction) ([place]) 06/2015  . Tobacco abuse    Past Surgical History:  Procedure Laterality Date  . CARDIAC CATHETERIZATION Bilateral 07/23/2015   Procedure: Left Heart Cath and Coronary Angiography;  Surgeon: Minna Merritts, MD;  Location: [facility];  Service: Cardiovascular;  Laterality: Bilateral;  . CORONARY ARTERY BYPASS GRAFT N/A 07/25/2015   Procedure: CORONARY ARTERY BYPASS GRAFTING X 3 UTILIZING BILATERAL IMA AND LEFT RADIAL ARTERY;  Surgeon: Melrose Nakayama, MD;  Location: [facility];  Service: Open Heart Surgery;  Laterality: N/A;  . fractured toe Right 2013   great toe/with pin  . RADIAL ARTERY HARVEST Left 07/25/2015   Procedure: RADIAL ARTERY HARVEST;  Surgeon: Melrose Nakayama, MD;  Location: [facility];  Service: Open Heart Surgery;  Laterality: Left;  . TEE WITHOUT CARDIOVERSION N/A 07/25/2015   Procedure: TRANSESOPHAGEAL ECHOCARDIOGRAM (TEE);  Surgeon: Melrose Nakayama, MD;  Location: [facility];  Service: Open Heart Surgery;  Laterality: N/A;   Family History  Problem  Relation Age of Onset  . Heart attack Father 65  . Cancer Father        blood  . Rheum arthritis Mother   . Leukemia Paternal Uncle   . Colon cancer Neg Hx    Social History  Substance Use Topics  . Smoking status: Former Smoker    Packs/day: 1.00    Years: 30.00    Types: Cigarettes    Quit date: 07/17/2015  . Smokeless tobacco: Never Used  . Alcohol use 0.0 oz/week     Comment: rare      Review of Systems Per HPI    Objective:   Physical Exam  Constitutional: He is oriented to person, place, and time. He appears well-developed and well-nourished.  Eyes: Conjunctivae are normal.  Cardiovascular: Normal rate.   Pulmonary/Chest: Effort normal.  Musculoskeletal:       Left knee: He exhibits swelling. He exhibits normal range of motion.       Legs: Neurological: He is alert and oriented to person, place, and time.  Vitals reviewed.       BP 114/72 (BP Location: Right Arm, Patient Position: Sitting, Cuff Size: Normal)   Pulse 79   Temp 97.7 F (36.5 C) (Oral)   Wt 179 lb 4 oz (81.3 kg)   SpO2 98%   BMI 28.07 kg/m  Wt Readings from Last 3 Encounters:  03/31/17 179 lb 4 oz (81.3 kg)  03/14/17 170 lb (77.1 kg)  12/29/16 171 lb 4 oz (77.7 kg)    Assessment &  Plan:  1. Swelling of joint of left knee - painless and getting better, continue to monitor, can use mild compression sleeve or ace wrap for comfort, elevate, heat - if not better in 5-7 days, I suggested he follow up with Dr. Lorelei Pont (sports medicine)  2. Encounter for staple removal - tolerated well, aftercare instructions provided  3. Need for influenza vaccination - Flu Vaccine QUAD 6+ mos PF IM (Fluarix Quad PF)   Clarene Reamer, FNP-BC  [facility]  04/01/2017 10:25 AM

## 2017-03-31 NOTE — Patient Instructions (Signed)
It was a pleasure to meet you today  I expect the knee swelling to continue to go down, if not better in 5-7 days, please make and appointment with Dr Lorelei Pont (sports medicine)  Can try ace wrap or compression while working  Suture Removal, Care After Refer to this sheet in the next few weeks. These instructions provide you with information on caring for yourself after your procedure. Your health care provider may also give you more specific instructions. Your treatment has been planned according to current medical practices, but problems sometimes occur. Call your health care provider if you have any problems or questions after your procedure. What can I expect after the procedure? After your stitches (sutures) are removed, it is typical to have the following:  Some discomfort and swelling in the wound area.  Slight redness in the area.  Follow these instructions at home:  If you have skin adhesive strips over the wound area, do not take the strips off. They will fall off on their own in a few days. If the strips remain in place after 14 days, you may remove them.  Change any bandages (dressings) at least once a day or as directed by your health care provider. If the bandage sticks, soak it off with warm, soapy water.  Apply cream or ointment only as directed by your health care provider. If using cream or ointment, wash the area with soap and water 2 times a day to remove all the cream or ointment. Rinse off the soap and pat the area dry with a clean towel.  Keep the wound area dry and clean. If the bandage becomes wet or dirty, or if it develops a bad smell, change it as soon as possible.  Continue to protect the wound from injury.  Use sunscreen when out in the sun. New scars become sunburned easily. Contact a health care provider if:  You have increasing redness, swelling, or pain in the wound.  You see pus coming from the wound.  You have a fever.  You notice a bad smell  coming from the wound or dressing.  Your wound breaks open (edges not staying together). This information is not intended to replace advice given to you by your health care provider. Make sure you discuss any questions you have with your health care provider. Document Released: 03/03/2001 Document Revised: 11/14/2015 Document Reviewed: 01/18/2013 Elsevier Interactive Patient Education  2017 [employer].

## 2017-04-08 ENCOUNTER — Encounter: Payer: Self-pay | Admitting: Family Medicine

## 2017-05-17 DIAGNOSIS — L820 Inflamed seborrheic keratosis: Secondary | ICD-10-CM | POA: Diagnosis not present

## 2017-05-17 DIAGNOSIS — L218 Other seborrheic dermatitis: Secondary | ICD-10-CM | POA: Diagnosis not present

## 2017-05-31 ENCOUNTER — Ambulatory Visit: Payer: Self-pay | Admitting: Family Medicine

## 2017-07-01 ENCOUNTER — Encounter: Payer: Self-pay | Admitting: Cardiovascular Disease

## 2017-07-01 ENCOUNTER — Ambulatory Visit: Payer: Self-pay | Admitting: Cardiovascular Disease

## 2017-07-01 DIAGNOSIS — I9789 Other postprocedural complications and disorders of the circulatory system, not elsewhere classified: Secondary | ICD-10-CM

## 2017-07-01 DIAGNOSIS — I4891 Unspecified atrial fibrillation: Secondary | ICD-10-CM

## 2017-07-01 HISTORY — DX: Unspecified atrial fibrillation: I48.91

## 2017-07-01 NOTE — Progress Notes (Deleted)
Cardiology Office Note  Date:  07/01/2017   ID:  Jeffrey Spence, DOB Mar 16, 1961, MRN [id number]  PCP:  Lucille Passy, MD   No chief complaint on file.   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  underwent coronary bypass grafting 3 with bilateral mammaries and a left radial artery on 07/25/2015. Postoperatively he had atrial fibrillation. He converted to sinus rhythm with amiodarone. He did not require anticoagulation.   Cardiac catheterization report 80-90% ostial left main disease that did not improve with nitroglycerin IC 80% proximal LAD disease, long region with moderate calcification 80-90% proximal RCA disease, calcified, ulcerative plaque 70% proximal left circumflex disease Essentially normal LV gram, ejection fraction greater than 55 %   PMH:   has a past medical history of Arthritis, CAD (coronary artery disease), HLD (hyperlipidemia), NSTEMI (non-ST elevated myocardial infarction) ([place]) (06/2015), and Tobacco abuse.  PSH:    Past Surgical History:  Procedure Laterality Date  . CARDIAC  CATHETERIZATION Bilateral 07/23/2015   Procedure: Left Heart Cath and Coronary Angiography;  Surgeon: Minna Merritts, MD;  Location: [facility];  Service: Cardiovascular;  Laterality: Bilateral;  . CORONARY ARTERY BYPASS GRAFT N/A 07/25/2015   Procedure: CORONARY ARTERY BYPASS GRAFTING X 3 UTILIZING BILATERAL IMA AND LEFT RADIAL ARTERY;  Surgeon: Melrose Nakayama, MD;  Location: [facility];  Service: Open Heart Surgery;  Laterality: N/A;  . fractured toe Right 2013   great toe/with pin  . RADIAL ARTERY HARVEST Left 07/25/2015   Procedure: RADIAL ARTERY HARVEST;  Surgeon: Melrose Nakayama, MD;  Location: [employer];  Service: Open Heart Surgery;  Laterality: Left;  . TEE WITHOUT CARDIOVERSION N/A 07/25/2015   Procedure: TRANSESOPHAGEAL ECHOCARDIOGRAM (TEE);  Surgeon: Melrose Nakayama, MD;  Location: [facility];  Service: Open Heart Surgery;  Laterality: N/A;    Current Outpatient Medications  Medication Sig Dispense Refill  . aspirin 81 MG tablet Take 1 tablet (81 mg total) by mouth daily.    . clopidogrel (PLAVIX) 75 MG tablet Take 1 tablet (75 mg total) by mouth daily. 90 tablet 4  . Loratadine (CLARITIN PO) Take 1 tablet by mouth as needed.     . metoprolol tartrate (LOPRESSOR) 25 MG tablet Take 0.5 tablets (12.5 mg total) by mouth 2 (two) times daily. 90 tablet 3  . rosuvastatin (CRESTOR) 20 MG tablet Take 1 tablet (20 mg total) by mouth daily. 90 tablet 3   No current facility-administered medications for this visit.      Allergies:   Patient has no known allergies.   Social History:  The patient  reports that he quit smoking about  1 years ago. His smoking use included cigarettes. He has a 30.00 pack-year smoking history. he has never used smokeless tobacco. He reports that he drinks alcohol. He reports that he does not use drugs.   Family History:   family history includes Cancer in his father; Heart attack (age of onset: 2) in his father; Leukemia in his paternal uncle; Rheum  arthritis in his mother.    Review of Systems: Review of Systems  Constitutional: Negative.   Respiratory: Negative.   Cardiovascular: Negative.   Gastrointestinal: Negative.   Musculoskeletal: Negative.   Neurological: Negative.   Psychiatric/Behavioral: Negative.   All other systems reviewed and are negative.    PHYSICAL EXAM: VS:  There were no vitals taken for this visit. , BMI There is no height or weight on file to calculate BMI. GEN: Well nourished, well developed, in no acute distress  HEENT: normal  Neck: no JVD, carotid bruits, or masses Cardiac: RRR; no murmurs, rubs, or gallops,no edema  Respiratory:  clear to auscultation bilaterally, normal work of breathing GI: soft, nontender, nondistended, + BS MS: no deformity or atrophy  Skin: warm and dry, no rash Neuro:  Strength and sensation are intact Psych: euthymic mood, full affect    Recent Labs: 08/26/2016: ALT 16; BUN 16; Creatinine, Ser 1.04; Potassium 3.7; Sodium 140    Lipid Panel Lab Results  Component Value Date   CHOL 213 (H) 11/22/2015   HDL 46 11/22/2015   LDLCALC 146 (H) 11/22/2015   TRIG 105 11/22/2015      Wt Readings from Last 3 Encounters:  03/31/17 179 lb 4 oz (81.3 kg)  03/14/17 170 lb (77.1 kg)  12/29/16 171 lb 4 oz (77.7 kg)       ASSESSMENT AND PLAN:  NSTEMI (non-ST elevated myocardial infarction) ([place]) -  Denies any symptoms concerning for angina Continue aspirin, Plavix, beta blocker, Will restart Crestor 20 mg daily  Coronary artery disease involving left main coronary artery -  Currently with no symptoms of angina. No further workup at this time. Continue current medication regimen.   hypercholesterolemia We have renewed his Crestor 20 mg daily Liver and lipid in 3 months time  TOBACCO ABUSE Quit smoking 2 years ago   Total encounter time more than 25 minutes  Greater than 50% was spent in counseling and coordination of care with the patient   Disposition:    F/U  12 months   No orders of the defined types were placed in this encounter.    Signed, Esmond Plants, M.D., Ph.D. 07/01/2017  [place], [place]

## 2017-07-02 ENCOUNTER — Ambulatory Visit: Payer: BLUE CROSS/BLUE SHIELD | Admitting: Cardiovascular Disease

## 2017-07-07 ENCOUNTER — Encounter: Payer: Self-pay | Admitting: Cardiovascular Disease

## 2017-07-20 ENCOUNTER — Ambulatory Visit: Payer: Self-pay | Admitting: *Deleted

## 2017-07-20 NOTE — Telephone Encounter (Signed)
Pt  Has  Congestion and  Pain  l   Side face    With  A  Funny  Taste    Pt  Reports  Drainage  Down  Back of throat   Denies  Any  Fever     Pt  Reports  The  Symptoms  X  3  Days  Pt  Was  A  Patient of  Dr Deborra Medina in past  He has  Not  Established   Yet  With a  PCP  But he  Wants  To  Stay  With  [facility] . Spoke  With Jeani Hawking at [employer]  Who  Stated   Pt  Could  Be  Seen for  An acute  Visit  But  Pt would  Need  To  Establish   With  A  PCP  At the practice  For continuity  Of  Care . Pt  States he understands  And  Will  Make an  Appoint to  Establish  With one  PCP    Reason for Disposition . [1] Using nasal washes and pain medicine > 24 hours AND [2] sinus pain (around cheekbone or eye) persists  Answer Assessment - Initial Assessment Questions 1. LOCATION: "Where does it hurt?"       Underneath  l  Eye   Sinus   [place]     2. ONSET: "When did the sinus pain start?"  (e.g., hours, days)         3  Days  ago 3. SEVERITY: "How bad is the pain?"   (Scale 1-10; mild, moderate or severe)   - MILD (1-3): doesn't interfere with normal activities    - MODERATE (4-7): interferes with normal activities (e.g., work or school) or awakens from sleep   - SEVERE (8-10): excruciating pain and patient unable to do any normal activities          5    4. RECURRENT SYMPTOM: "Have you ever had sinus problems before?" If so, ask: "When was the last time?" and "What happened that time?"       Yes   Allergies  In past    No  Recurrent  Sinus   Problems   5. NASAL CONGESTION: "Is the nose blocked?" If so, ask, "Can you open it or must you breathe through the mouth?"       At  Times  Mainly  l  Side    6. NASAL DISCHARGE: "Do you have discharge from your nose?" If so ask, "What color?"      No   7. FEVER: "Do you have a fever?" If so, ask: "What is it, how was it measured, and when did it start?"      no 8. OTHER SYMPTOMS: "Do you have any other symptoms?" (e.g., sore throat, cough, earache, difficulty  breathing)    No 9. PREGNANCY: "Is there any chance you are pregnant?" "When was your last menstrual period?"    n/a  Protocols used: SINUS PAIN OR CONGESTION-A-AH

## 2017-07-21 ENCOUNTER — Encounter: Payer: Self-pay | Admitting: Internal Medicine

## 2017-07-21 ENCOUNTER — Ambulatory Visit: Payer: BLUE CROSS/BLUE SHIELD | Admitting: Internal Medicine

## 2017-07-21 ENCOUNTER — Encounter: Payer: Self-pay | Admitting: *Deleted

## 2017-07-21 VITALS — BP 132/72 | HR 87 | Temp 97.8°F | Wt 175.5 lb

## 2017-07-21 DIAGNOSIS — J0100 Acute maxillary sinusitis, unspecified: Secondary | ICD-10-CM

## 2017-07-21 MED ORDER — FLUTICASONE PROPIONATE 50 MCG/ACT NA SUSP: 2.0000 | Freq: Every day | NASAL | 12 refills | Status: DC

## 2017-07-21 MED ORDER — AMOXICILLIN 500 MG PO TABS: 1000.0000 mg | ORAL_TABLET | Freq: Two times a day (BID) | ORAL | 0 refills | Status: AC

## 2017-07-21 NOTE — Patient Instructions (Signed)
Try the fluticasone nasal spray---even twice a day --for the next week or 2. If your symptoms worsen in the next few days, start the amoxicillin antibiotic.

## 2017-07-21 NOTE — Progress Notes (Signed)
Subjective:    Patient ID: Jeffrey Spence, male    DOB: [birth year], 57 y.o.   MRN: [id number]  HPI Here due to headache and sinus symptoms  Has been sick for 3-4 days Chronic sinus drainage---OTC usually helps Now having left maxillary pain/cough/pain behind eye also Overly tired--increased sleep  Cough is dry No fever, chills, sweats Clear post nasal drip---purulent No SOB No ear pain or sore throat  Tried tylenol cold/flu--- only once due to heart meds Now just tylenol  Current Outpatient Medications on File Prior to Visit  Medication Sig Dispense Refill  . aspirin 81 MG tablet Take 1 tablet (81 mg total) by mouth daily.    . clopidogrel (PLAVIX) 75 MG tablet Take 1 tablet (75 mg total) by mouth daily. 90 tablet 4  . Loratadine (CLARITIN PO) Take 1 tablet by mouth as needed.     . metoprolol tartrate (LOPRESSOR) 25 MG tablet Take 0.5 tablets (12.5 mg total) by mouth 2 (two) times daily. 90 tablet 3  . rosuvastatin (CRESTOR) 20 MG tablet Take 1 tablet (20 mg total) by mouth daily. 90 tablet 3   No current facility-administered medications on file prior to visit.     No Known Allergies  Past Medical History:  Diagnosis Date  . Arthritis   . CAD (coronary artery disease)    a. nstemi 1.2017; b. cardiac cath 06/2015 with LM and severe 3 veseel dz (full report pending)  . HLD (hyperlipidemia)   . NSTEMI (non-ST elevated myocardial infarction) ([place]) 06/2015  . Postoperative atrial fibrillation ([place]) 07/01/2017  . Tobacco abuse     Past Surgical History:  Procedure Laterality Date  . CARDIAC CATHETERIZATION Bilateral 07/23/2015   Procedure: Left Heart Cath and Coronary Angiography;  Surgeon: Minna Merritts, MD;  Location: [facility];  Service: Cardiovascular;  Laterality: Bilateral;  . CORONARY ARTERY BYPASS GRAFT N/A 07/25/2015   Procedure: CORONARY ARTERY BYPASS GRAFTING X 3 UTILIZING BILATERAL IMA AND LEFT RADIAL ARTERY;  Surgeon: Melrose Nakayama, MD;   Location: [place];  Service: Open Heart Surgery;  Laterality: N/A;  . fractured toe Right 2013   great toe/with pin  . RADIAL ARTERY HARVEST Left 07/25/2015   Procedure: RADIAL ARTERY HARVEST;  Surgeon: Melrose Nakayama, MD;  Location: [facility];  Service: Open Heart Surgery;  Laterality: Left;  . TEE WITHOUT CARDIOVERSION N/A 07/25/2015   Procedure: TRANSESOPHAGEAL ECHOCARDIOGRAM (TEE);  Surgeon: Melrose Nakayama, MD;  Location: [place];  Service: Open Heart Surgery;  Laterality: N/A;    Family History  Problem Relation Age of Onset  . Heart attack Father 63  . Cancer Father        blood  . Rheum arthritis Mother   . Leukemia Paternal Uncle   . Colon cancer Neg Hx     Social History   Socioeconomic History  . Marital status: Married    Spouse name: Not on file  . Number of children: 7  . Years of education: Not on file  . Highest education level: Not on file  Social Needs  . Financial resource strain: Not on file  . Food insecurity - worry: Not on file  . Food insecurity - inability: Not on file  . Transportation needs - medical: Not on file  . Transportation needs - non-medical: Not on file  Occupational History    Employer: [employer]  Tobacco Use  . Smoking status: Former Smoker    Packs/day: 1.00  Years: 30.00    Pack years: 30.00    Types: Cigarettes    Last attempt to quit: 07/17/2015    Years since quitting: 2.0  . Smokeless tobacco: Never Used  Substance and Sexual Activity  . Alcohol use: Yes    Alcohol/week: 0.0 oz    Comment: rare  . Drug use: No  . Sexual activity: Not on file  Other Topics Concern  . Not on file  Social History Narrative   Regular exercise--yes   Review of Systems  No rash No vomiting or diarrhea Appetite is okay---but limited Hasn't missed work     Objective:   Physical Exam  Constitutional: He appears well-developed. No distress.  HENT:  Mouth/Throat: Oropharynx is clear and moist. No oropharyngeal exudate.    No sinus tenderness TMs normal Moderate nasal inflammation  Neck: No thyromegaly present.  Pulmonary/Chest: Effort normal and breath sounds normal. No respiratory distress. He has no wheezes. He has no rales.  Lymphadenopathy:    He has no cervical adenopathy.          Assessment & Plan:

## 2017-07-21 NOTE — Assessment & Plan Note (Signed)
Still seems to be viral Discussed supportive care Try fluticasone If worsens, start the amoxil

## 2017-08-03 ENCOUNTER — Telehealth: Payer: Self-pay | Admitting: Cardiovascular Disease

## 2017-08-03 DIAGNOSIS — E782 Mixed hyperlipidemia: Secondary | ICD-10-CM

## 2017-08-03 DIAGNOSIS — Z79899 Other long term (current) drug therapy: Secondary | ICD-10-CM

## 2017-08-03 NOTE — Telephone Encounter (Signed)
Patient has upcoming appt with gollan on 2 21 and wants to know if he needs any bloodwork   Please call

## 2017-08-03 NOTE — Telephone Encounter (Signed)
S/w patient. He last saw Dr Rockey Situ in July 2018 and was to get repeat Lipid and Liver in October which was not done. Advised patient he could go to [facility] sometime before upcoming appointment and to be fasting for the lab work. Re-submitted orders to make sure they were in correctly.

## 2017-08-09 ENCOUNTER — Other Ambulatory Visit
Admission: RE | Admit: 2017-08-09 | Discharge: 2017-08-09 | Disposition: A | Discharge status: Home / Self Care | Payer: BLUE CROSS/BLUE SHIELD | Source: Ambulatory Visit | Attending: Cardiovascular Disease | Admitting: Cardiovascular Disease

## 2017-08-09 ENCOUNTER — Telehealth: Payer: Self-pay | Admitting: Cardiovascular Disease

## 2017-08-09 DIAGNOSIS — Z79899 Other long term (current) drug therapy: Secondary | ICD-10-CM | POA: Diagnosis not present

## 2017-08-09 DIAGNOSIS — E782 Mixed hyperlipidemia: Secondary | ICD-10-CM | POA: Diagnosis not present

## 2017-08-09 LAB — HEPATIC FUNCTION PANEL
ALT: 17 U/L (ref 17–63)
AST: 19 U/L (ref 15–41)
Albumin: 4 g/dL (ref 3.5–5.0)
Alkaline Phosphatase: 125 U/L (ref 38–126)
Bilirubin, Direct: <0.1 mg/dL — ABNORMAL LOW (ref 0.1–0.5)
Total Bilirubin: 0.5 mg/dL (ref 0.3–1.2)
Total Protein: 7.1 g/dL (ref 6.5–8.1)

## 2017-08-09 LAB — LIPID PANEL
Cholesterol: 143 mg/dL (ref 0–200)
HDL: 41 mg/dL (ref >40)
LDL Cholesterol: 77 mg/dL (ref 0–99)
Total CHOL/HDL Ratio: 3.5 RATIO
Triglycerides: 123 mg/dL (ref <150)
VLDL: 25 mg/dL (ref 0–40)

## 2017-08-09 NOTE — Telephone Encounter (Signed)
S/w patient. Lab result from 07/24/2015 showed patient was AB positive. Patient verbalized understanding.

## 2017-08-09 NOTE — Telephone Encounter (Signed)
Patient wants to know what his blood type is   Please call

## 2017-08-10 NOTE — Progress Notes (Signed)
Cardiology Office Note  Date:  08/12/2017   ID:  Jeffrey Spence, DOB [birth year], MRN [id number]  PCP:  Lucille Passy, MD   Chief Complaint  Patient presents with  . OTHER    6 month f/u no complaints today. Meds reviewed verbally with pt.    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  underwent coronary bypass grafting 3 with bilateral mammaries and a left radial artery on 07/25/2015. Postoperatively he had atrial fibrillation. He converted to sinus rhythm with amiodarone. He did not require anticoagulation.   Cardiac catheterization report 80-90% ostial left main disease that did not improve with nitroglycerin IC 80% proximal LAD disease, long region with moderate calcification 80-90% proximal RCA disease, calcified, ulcerative plaque 70% proximal left circumflex disease Essentially normal LV gram, ejection fraction greater than 55 %   PMH:   has a past medical history of Arthritis, CAD (coronary artery disease), HLD (hyperlipidemia), NSTEMI (non-ST elevated myocardial infarction) ([place]) (06/2015), Postoperative atrial fibrillation ([place]) (07/01/2017), and Tobacco abuse.  PSH:    Past Surgical  History:  Procedure Laterality Date  . CARDIAC CATHETERIZATION Bilateral 07/23/2015   Procedure: Left Heart Cath and Coronary Angiography;  Surgeon: Minna Merritts, MD;  Location: [facility];  Service: Cardiovascular;  Laterality: Bilateral;  . CORONARY ARTERY BYPASS GRAFT N/A 07/25/2015   Procedure: CORONARY ARTERY BYPASS GRAFTING X 3 UTILIZING BILATERAL IMA AND LEFT RADIAL ARTERY;  Surgeon: Melrose Nakayama, MD;  Location: [facility];  Service: Open Heart Surgery;  Laterality: N/A;  . fractured toe Right 2013   great toe/with pin  . RADIAL ARTERY HARVEST Left 07/25/2015   Procedure: RADIAL ARTERY HARVEST;  Surgeon: Melrose Nakayama, MD;  Location: [facility];  Service: Open Heart Surgery;  Laterality: Left;  . TEE WITHOUT CARDIOVERSION N/A 07/25/2015   Procedure: TRANSESOPHAGEAL ECHOCARDIOGRAM (TEE);  Surgeon: Melrose Nakayama, MD;  Location: [facility];  Service: Open Heart Surgery;  Laterality: N/A;    Current Outpatient Medications  Medication Sig Dispense Refill  . aspirin 81 MG tablet Take 1 tablet (81 mg total) by mouth daily.    . fluticasone (FLONASE) 50 MCG/ACT nasal spray Place 2 sprays into both nostrils daily. In each nostril 16 g 12  . Loratadine (CLARITIN PO) Take 1 tablet by mouth as needed.     . metoprolol tartrate (LOPRESSOR) 25 MG tablet Take 0.5 tablets (12.5 mg total) by mouth 2 (two) times daily. 90 tablet 3  . rosuvastatin (CRESTOR) 20 MG tablet Take 1 tablet (20 mg total) by mouth daily. 90 tablet 3  . rivaroxaban (XARELTO) 2.5 MG TABS tablet Take 1 tablet (2.5 mg total) by mouth 2 (two) times daily. 180 tablet 3   No current facility-administered  medications for this visit.      Allergies:   Patient has no known allergies.   Social History:  The patient  reports that he quit smoking about 2 years ago. His smoking use included cigarettes. He has a 30.00 pack-year smoking history. he has never used smokeless tobacco. He reports that he drinks alcohol. He  reports that he does not use drugs.   Family History:   family history includes Cancer in his father; Heart attack (age of onset: 35) in his father; Leukemia in his paternal uncle; Rheum arthritis in his mother.    Review of Systems: Review of Systems  Constitutional: Negative.   Respiratory: Negative.   Cardiovascular: Negative.   Gastrointestinal: Negative.   Musculoskeletal: Negative.   Neurological: Negative.   Psychiatric/Behavioral: Negative.   All other systems reviewed and are negative.    PHYSICAL EXAM: VS:  BP 102/70 (BP Location: Left Arm, Patient Position: Sitting, Cuff Size: Normal)   Pulse 75   Ht 5\' 7"  (1.702 m)   Wt 175 lb 8 oz (79.6 kg)   BMI 27.49 kg/m  , BMI Body mass index is 27.49 kg/m. GEN: Well nourished, well developed, in no acute distress  HEENT: normal  Neck: no JVD, carotid bruits, or masses Cardiac: RRR; no murmurs, rubs, or gallops,no edema  Respiratory:  clear to auscultation bilaterally, normal work of breathing GI: soft, nontender, nondistended, + BS MS: no deformity or atrophy  Skin: warm and dry, no rash Neuro:  Strength and sensation are intact Psych: euthymic mood, full affect    Recent Labs: 08/26/2016: BUN 16; Creatinine, Ser 1.04; Potassium 3.7; Sodium 140 08/09/2017: ALT 17    Lipid Panel Lab Results  Component Value Date   CHOL 143 08/09/2017   HDL 41 08/09/2017   LDLCALC 77 08/09/2017   TRIG 123 08/09/2017      Wt Readings from Last 3 Encounters:  08/12/17 175 lb 8 oz (79.6 kg)  07/21/17 175 lb 8 oz (79.6 kg)  03/31/17 179 lb 4 oz (81.3 kg)       ASSESSMENT AND PLAN:  NSTEMI (non-ST elevated myocardial infarction) (HCC) -  Denies any symptoms concerning for angina Continue aspirin,beta blocker,  Crestor 20 mg daily Recommend he stop his Plavix and we will start Xarelto 2.5 twice daily with low-dose aspirin.  Long discussion concerning benefit of changing to the Xarelto  Coronary artery disease involving left  main coronary artery -  Currently with no symptoms of angina. No further workup at this time. Continue current medication regimen with changes as detailed above   hypercholesterolemia  Crestor 20 mg daily Cholesterol is at goal on the current lipid regimen. No changes to the medications were made.  TOBACCO ABUSE Quit smoking 2 years ago   Total encounter time more than 25 minutes  Greater than 50% was spent in counseling and coordination of care with the patient   Disposition:   F/U  12 months   Orders Placed This Encounter  Procedures  . EKG 12-Lead     Signed, Esmond Plants, M.D., Ph.D. 08/12/2017  [place], [place]

## 2017-08-12 ENCOUNTER — Ambulatory Visit: Payer: BLUE CROSS/BLUE SHIELD | Admitting: Cardiovascular Disease

## 2017-08-12 ENCOUNTER — Encounter: Payer: Self-pay | Admitting: Cardiovascular Disease

## 2017-08-12 VITALS — BP 102/70 | HR 75 | Ht 67.0 in | Wt 175.5 lb

## 2017-08-12 DIAGNOSIS — F17200 Nicotine dependence, unspecified, uncomplicated: Secondary | ICD-10-CM

## 2017-08-12 DIAGNOSIS — I25118 Atherosclerotic heart disease of native coronary artery with other forms of angina pectoris: Secondary | ICD-10-CM | POA: Diagnosis not present

## 2017-08-12 DIAGNOSIS — E7800 Pure hypercholesterolemia, unspecified: Secondary | ICD-10-CM | POA: Diagnosis not present

## 2017-08-12 DIAGNOSIS — Z951 Presence of aortocoronary bypass graft: Secondary | ICD-10-CM

## 2017-08-12 MED ORDER — RIVAROXABAN 2.5 MG PO TABS: 2.5000 mg | ORAL_TABLET | Freq: Two times a day (BID) | ORAL | 3 refills | Status: DC

## 2017-08-12 NOTE — Patient Instructions (Signed)
Medication Instructions:   Please hold the plavix Start xarelto 2.5 mg twice a day with asa 81 mg daily  Labwork:  No new labs needed  Testing/Procedures:  No further testing at this time   Follow-Up: It was a pleasure seeing you in the office today. Please call us if you have new issues that need to be addressed before your next appt.  [phone]  Your physician wants you to follow-up in: 12 months.  You will receive a reminder letter in the mail two months in advance. If you don't receive a letter, please call our office to schedule the follow-up appointment.  If you need a refill on your cardiac medications before your next appointment, please call your pharmacy.  For educational health videos Log in to : [email or web address] Or : [email or web address], password : [email or web address]

## 2017-08-23 ENCOUNTER — Telehealth: Payer: Self-pay | Admitting: Cardiovascular Disease

## 2017-08-23 NOTE — Telephone Encounter (Signed)
Patient had additional blood work done for job recently  B12 was severely low at 250 Patient would like to know if this is a cause for concern for Dr Rockey Situ or should he go through his primary care  Please call to discuss

## 2017-08-23 NOTE — Telephone Encounter (Signed)
Spoke with patient and he wanted to see if we could compare his B12 levels from the past. Reviewed results and I do not see any previous B12 readings and advised that he check with his primary care provider. He verbalized understanding with no further questions at this time.

## 2017-08-23 NOTE — Telephone Encounter (Signed)
Left voicemail message to call back  

## 2018-02-12 ENCOUNTER — Other Ambulatory Visit: Payer: Self-pay | Admitting: Cardiovascular Disease

## 2018-02-26 ENCOUNTER — Emergency Department: Payer: BLUE CROSS/BLUE SHIELD

## 2018-02-26 ENCOUNTER — Emergency Department
Admission: EM | Admit: 2018-02-26 | Discharge: 2018-02-26 | Disposition: A | Discharge status: Home / Self Care | Payer: BLUE CROSS/BLUE SHIELD | Attending: Emergency Medicine | Admitting: Emergency Medicine

## 2018-02-26 ENCOUNTER — Other Ambulatory Visit: Payer: Self-pay

## 2018-02-26 DIAGNOSIS — I252 Old myocardial infarction: Secondary | ICD-10-CM | POA: Diagnosis not present

## 2018-02-26 DIAGNOSIS — R51 Headache: Secondary | ICD-10-CM | POA: Diagnosis not present

## 2018-02-26 DIAGNOSIS — J0101 Acute recurrent maxillary sinusitis: Secondary | ICD-10-CM

## 2018-02-26 DIAGNOSIS — R112 Nausea with vomiting, unspecified: Secondary | ICD-10-CM | POA: Diagnosis not present

## 2018-02-26 DIAGNOSIS — Z7982 Long term (current) use of aspirin: Secondary | ICD-10-CM | POA: Diagnosis not present

## 2018-02-26 DIAGNOSIS — Z79899 Other long term (current) drug therapy: Secondary | ICD-10-CM | POA: Diagnosis not present

## 2018-02-26 DIAGNOSIS — J329 Chronic sinusitis, unspecified: Secondary | ICD-10-CM | POA: Diagnosis not present

## 2018-02-26 DIAGNOSIS — Z951 Presence of aortocoronary bypass graft: Secondary | ICD-10-CM | POA: Diagnosis not present

## 2018-02-26 DIAGNOSIS — Z87891 Personal history of nicotine dependence: Secondary | ICD-10-CM | POA: Insufficient documentation

## 2018-02-26 DIAGNOSIS — I2510 Atherosclerotic heart disease of native coronary artery without angina pectoris: Secondary | ICD-10-CM | POA: Diagnosis not present

## 2018-02-26 LAB — CBC
HCT: 41.8 % (ref 40.0–52.0)
Hemoglobin: 14.3 g/dL (ref 13.0–18.0)
MCH: 31.6 pg (ref 26.0–34.0)
MCHC: 34.3 g/dL (ref 32.0–36.0)
MCV: 92 fL (ref 80.0–100.0)
Platelets: 321 10*3/uL (ref 150–440)
RBC: 4.54 MIL/uL (ref 4.40–5.90)
RDW: 13.7 % (ref 11.5–14.5)
WBC: 12.2 10*3/uL — ABNORMAL HIGH (ref 3.8–10.6)

## 2018-02-26 LAB — COMPREHENSIVE METABOLIC PANEL
ALT: 52 U/L — ABNORMAL HIGH (ref 0–44)
AST: 28 U/L (ref 15–41)
Albumin: 4.1 g/dL (ref 3.5–5.0)
Alkaline Phosphatase: 178 U/L — ABNORMAL HIGH (ref 38–126)
Anion gap: 8 (ref 5–15)
BUN: 18 mg/dL (ref 6–20)
CO2: 23 mmol/L (ref 22–32)
Calcium: 8.9 mg/dL (ref 8.9–10.3)
Chloride: 109 mmol/L (ref 98–111)
Creatinine, Ser: 0.77 mg/dL (ref 0.61–1.24)
GFR calc Af Amer: >60 mL/min (ref >60)
GFR calc non Af Amer: >60 mL/min (ref >60)
Glucose, Bld: 126 mg/dL — ABNORMAL HIGH (ref 70–99)
Potassium: 3.7 mmol/L (ref 3.5–5.1)
Sodium: 140 mmol/L (ref 135–145)
Total Bilirubin: 0.8 mg/dL (ref 0.3–1.2)
Total Protein: 7.8 g/dL (ref 6.5–8.1)

## 2018-02-26 LAB — DIFFERENTIAL
Basophils Absolute: 0 10*3/uL (ref 0–0.1)
Basophils Relative: 0 %
Eosinophils Absolute: 0.1 10*3/uL (ref 0–0.7)
Eosinophils Relative: 1 %
Lymphocytes Relative: 12 %
Lymphs Abs: 1.4 10*3/uL (ref 1.0–3.6)
Monocytes Absolute: 0.7 10*3/uL (ref 0.2–1.0)
Monocytes Relative: 6 %
Neutro Abs: 9.9 10*3/uL — ABNORMAL HIGH (ref 1.4–6.5)
Neutrophils Relative %: 81 %

## 2018-02-26 LAB — GLUCOSE, CAPILLARY: Glucose-Capillary: 119 mg/dL — ABNORMAL HIGH (ref 70–99)

## 2018-02-26 LAB — PROTIME-INR
INR: 0.96
Prothrombin Time: 12.7 seconds (ref 11.4–15.2)

## 2018-02-26 LAB — APTT: aPTT: 30 seconds (ref 24–36)

## 2018-02-26 MED ORDER — FENTANYL CITRATE (PF) 100 MCG/2ML IJ SOLN
50.0000 ug | Freq: Once | INTRAMUSCULAR | Status: DC
  Filled 2018-02-26: qty 2

## 2018-02-26 MED ORDER — AMOXICILLIN-POT CLAVULANATE 875-125 MG PO TABS: 1.0000 | ORAL_TABLET | Freq: Two times a day (BID) | ORAL | 0 refills | Status: DC

## 2018-02-26 MED ORDER — AMOXICILLIN-POT CLAVULANATE 875-125 MG PO TABS: 1.0000 | ORAL_TABLET | Freq: Two times a day (BID) | ORAL | 0 refills | Status: AC

## 2018-02-26 MED ORDER — PREDNISONE 20 MG PO TABS: 60.0000 mg | ORAL_TABLET | Freq: Every day | ORAL | 0 refills | Status: DC

## 2018-02-26 MED ORDER — OXYMETAZOLINE HCL 0.05 % NA SOLN
1.0000 | Freq: Once | NASAL | Status: AC
  Administered 2018-02-26: 1 via NASAL
  Filled 2018-02-26: qty 15

## 2018-02-26 MED ORDER — OXYCODONE-ACETAMINOPHEN 5-325 MG PO TABS: 1.0000 | ORAL_TABLET | ORAL | 0 refills | Status: DC | PRN

## 2018-02-26 NOTE — ED Triage Notes (Signed)
FIRST NURSE NOTE-pt having worst HA of life, sent for r/o SAH.  Left lateral gaze nystagmus per PA sending pt from urgent care.  Having pain behind left eye. Pulled next for triage.

## 2018-02-26 NOTE — ED Notes (Signed)
MD at bedside with patient and patient's family. 

## 2018-02-26 NOTE — ED Triage Notes (Signed)
Pt c/o of headache behind L eye for last couple days (Thursday per wife). Neuro exam clear in triage. No drift noted, no droop noted. Speech clear. Alert, oriented. Sent from UC. N&V in parking lot. Diarrhea as well per wife. Takes clopidogrel.

## 2018-02-26 NOTE — ED Provider Notes (Addendum)
[facility] Emergency Department Provider Note  ____________________________________________   I have reviewed the triage vital signs and the nursing notes. Where available I have reviewed prior notes and, if possible and indicated, outside hospital notes.    HISTORY  Chief Complaint My sinuses are acting up   HPI Jeffrey Spence is a 57 y.o. male with a history of ACS arthritis, and "sinus problems" presents today complaining of sinus pressure.  Patient states he had a skin stuffy nose with congestion and rhinorrhea for the last week and now is complaining of gradual onset pain in the sinuses in the maxillary and in the frontal region.  He does not have any ocular pain no change in vision no change in his visual acuity, no diplopia, he has no pain with ranging his eyes.  The pain is a constant pressure.  He states he goes away with Tylenol and then comes back.  He has not had any high fevers.  He denies any chills.  He denies any focal numbness or weakness, he denies any change in his vision or inability to speak.  States that this was a gradual onset headache that started after his congestion a few days ago.  It is worse when he bends over and better when he stands up.  No stiff neck, no shortness of breath no airway swelling     Past Medical History:  Diagnosis Date  . Arthritis   . CAD (coronary artery disease)    a. nstemi 1.2017; b. cardiac cath 06/2015 with LM and severe 3 veseel dz (full report pending)  . HLD (hyperlipidemia)   . NSTEMI (non-ST elevated myocardial infarction) ([place]) 06/2015  . Postoperative atrial fibrillation ([place]) 07/01/2017  . Tobacco abuse     Patient Active Problem List   Diagnosis Date Noted  . Acute non-recurrent maxillary sinusitis 07/21/2017  . Postoperative atrial fibrillation ([place]) 07/01/2017  . Abdominal bloating 08/26/2016  . Hyperlipidemia 10/07/2015  . Coronary artery disease involving native coronary artery of native  heart with unstable angina pectoris ([place])   . S/P CABG x 3 07/25/2015  . NSTEMI (non-ST elevated myocardial infarction) ([place])   . Acute coronary syndrome ([place])   . Unstable angina pectoris ([place])   . Smoker   . Coronary artery disease involving left main coronary artery   . Chest pain 07/22/2015  . Swelling of arm 01/20/2013  . Routine general medical examination at a health care facility 01/12/2013  . OA (osteoarthritis) of knee 01/12/2013  . TESTICULAR HYPOFUNCTION 08/14/2010  . TOBACCO ABUSE 08/12/2010    Past Surgical History:  Procedure Laterality Date  . CARDIAC CATHETERIZATION Bilateral 07/23/2015   Procedure: Left Heart Cath and Coronary Angiography;  Surgeon: Minna Merritts, MD;  Location: [facility];  Service: Cardiovascular;  Laterality: Bilateral;  . CORONARY ARTERY BYPASS GRAFT N/A 07/25/2015   Procedure: CORONARY ARTERY BYPASS GRAFTING X 3 UTILIZING BILATERAL IMA AND LEFT RADIAL ARTERY;  Surgeon: Melrose Nakayama, MD;  Location: [facility];  Service: Open Heart Surgery;  Laterality: N/A;  . fractured toe Right 2013   great toe/with pin  . RADIAL ARTERY HARVEST Left 07/25/2015   Procedure: RADIAL ARTERY HARVEST;  Surgeon: Melrose Nakayama, MD;  Location: [facility];  Service: Open Heart Surgery;  Laterality: Left;  . TEE WITHOUT CARDIOVERSION N/A 07/25/2015   Procedure: TRANSESOPHAGEAL ECHOCARDIOGRAM (TEE);  Surgeon: Melrose Nakayama, MD;  Location: [facility];  Service: Open Heart Surgery;  Laterality: N/A;  Prior to Admission medications   Medication Sig Start Date End Date Taking? Authorizing Provider  aspirin 81 MG tablet Take 1 tablet (81 mg total) by mouth daily. 10/07/15   Minna Merritts, MD  fluticasone (FLONASE) 50 MCG/ACT nasal spray Place 2 sprays into both nostrils daily. In each nostril 07/21/17   Venia Carbon, MD  Loratadine (CLARITIN PO) Take 1 tablet by mouth as needed.     [provider]  metoprolol tartrate (LOPRESSOR) 25 MG tablet  Take 0.5 tablets (12.5 mg total) by mouth 2 (two) times daily. 01/06/17   Minna Merritts, MD  rivaroxaban (XARELTO) 2.5 MG TABS tablet Take 1 tablet (2.5 mg total) by mouth 2 (two) times daily. 08/12/17   Minna Merritts, MD  rosuvastatin (CRESTOR) 20 MG tablet TAKE 1 TABLET(20 MG) BY MOUTH DAILY 02/14/18   Minna Merritts, MD    Allergies Patient has no known allergies.  Family History  Problem Relation Age of Onset  . Heart attack Father 25  . Cancer Father        blood  . Rheum arthritis Mother   . Leukemia Paternal Uncle   . Colon cancer Neg Hx     Social History Social History   Tobacco Use  . Smoking status: Former Smoker    Packs/day: 1.00    Years: 30.00    Pack years: 30.00    Types: Cigarettes    Last attempt to quit: 07/17/2015    Years since quitting: 2.6  . Smokeless tobacco: Never Used  Substance Use Topics  . Alcohol use: Yes    Alcohol/week: 0.0 standard drinks    Comment: rare  . Drug use: No    Review of Systems Constitutional: No fever/chills Eyes: No visual changes. ENT: No sore throat. No stiff neck no neck pain Cardiovascular: Denies chest pain. Respiratory: Denies shortness of breath. Gastrointestinal:   no vomiting.  No diarrhea.  No constipation. Genitourinary: Negative for dysuria. Musculoskeletal: Negative lower extremity swelling Skin: Negative for rash. Neurological: Negative for severe headaches, focal weakness or numbness.   ____________________________________________   PHYSICAL EXAM:  VITAL SIGNS: ED Triage Vitals  Enc Vitals Group     BP 02/26/18 1651 (!) 146/81     Pulse Rate 02/26/18 1651 72     Resp 02/26/18 1651 18     Temp 02/26/18 1651 (!) 97.4 F (36.3 C)     Temp Source 02/26/18 1651 Oral     SpO2 02/26/18 1651 98 %     Weight 02/26/18 1654 180 lb (81.6 kg)     Height 02/26/18 1654 5\' 7"  (1.702 m)     Head Circumference --      Peak Flow --      Pain Score 02/26/18 1654 8     Pain Loc --      Pain Edu?  --      Excl. in [place]? --     Constitutional: Alert and oriented. Well appearing and in no acute distress. Eyes: Conjunctivae are normal extraocular muscles are intact, there is no tenderness to the eye itself, are equally round and reactive light, pupillary reflexes are normal, no injection of the conjunctiva, there is no with ranging the eye.  No discharge.  No erythema.  No tenderness around the temporal arteries. Head: Atraumatic HEENT: + positive left congestion/rhinorrhea more than the right.  Mucous membranes are moist.  Oropharynx non-erythematous, there is tenderness to palpation of the left maxillary sinus which reproduces his headache  pain. Neck:   Nontender with no meningismus, no masses, no stridor Cardiovascular: Normal rate, regular rhythm. Grossly normal heart sounds.  Good peripheral circulation. Respiratory: Normal respiratory effort.  No retractions. Lungs CTAB. Abdominal: Soft and nontender. No distention. No guarding no rebound Back:  There is no focal tenderness or step off.  there is no midline tenderness there are no lesions noted. there is no CVA tenderness Musculoskeletal: No lower extremity tenderness, no upper extremity tenderness. No joint effusions, no DVT signs strong distal pulses no edema Neurologic:  Normal speech and language. No gross focal neurologic deficits are appreciated.  Skin:  Skin is warm, dry and intact. No rash noted. Psychiatric: Mood and affect are normal. Speech and behavior are normal.  ____________________________________________   LABS (all labs ordered are listed, but only abnormal results are displayed)  Labs Reviewed  CBC - Abnormal; Notable for the following components:      Result Value   WBC 12.2 (*)    All other components within normal limits  DIFFERENTIAL - Abnormal; Notable for the following components:   Neutro Abs 9.9 (*)    All other components within normal limits  COMPREHENSIVE METABOLIC PANEL - Abnormal; Notable for the  following components:   Glucose, Bld 126 (*)    ALT 52 (*)    Alkaline Phosphatase 178 (*)    All other components within normal limits  GLUCOSE, CAPILLARY - Abnormal; Notable for the following components:   Glucose-Capillary 119 (*)    All other components within normal limits  PROTIME-INR  APTT  CBG MONITORING, ED    Pertinent labs  results that were available during my care of the patient were reviewed by me and considered in my medical decision making (see chart for details). ____________________________________________  EKG  I personally interpreted any EKGs ordered by me or triage  ____________________________________________  RADIOLOGY  Pertinent labs & imaging results that were available during my care of the patient were reviewed by me and considered in my medical decision making (see chart for details). If possible, patient and/or family made aware of any abnormal findings.  Ct Head Wo Contrast  Result Date: 02/26/2018 CLINICAL DATA:  57 year old male with acute LEFT headache for 2 days. EXAM: CT HEAD WITHOUT CONTRAST TECHNIQUE: Contiguous axial images were obtained from the base of the skull through the vertex without intravenous contrast. COMPARISON:  None. FINDINGS: Brain: No evidence of acute infarction, hemorrhage, hydrocephalus, extra-axial collection or mass lesion/mass effect. Vascular: Mild carotid atherosclerotic calcifications noted. Skull: Normal. Negative for fracture or focal lesion. Sinuses/Orbits: No acute finding. Other: None. IMPRESSION: No evidence of acute intracranial abnormality. Electronically Signed   By: Margarette Canada M.D.   On: 02/26/2018 18:20   ____________________________________________    PROCEDURES  Procedure(s) performed: None  Procedures  Critical Care performed: None  ____________________________________________   INITIAL IMPRESSION / ASSESSMENT AND PLAN / ED COURSE  Pertinent labs & imaging results that were available during my  care of the patient were reviewed by me and considered in my medical decision making (see chart for details).  Patient here with sinus pain and pressure, this is not "worst headache of life" although it is 1 of the worst sinus headaches he has had.  It was gradual in onset.  CT scan is negative I do not think lumbar puncture is indicated.  I did however do a CT scan of the sinuses to validate the clinical diagnosis.  We are waiting that read.  I discussed with Dr. Freda Munro  Tami Ribas, who feels that steroids and antibiotics for this patient would probably be the best thing.  Much appreciate the consult.  No evidence of glaucoma or primary ocular pathology, he has very reproducible left maxillofacial tenderness in the context of sinusitis and nasal drainage.  No evidence of airway issues.  Nothing to suggest bleed or meningitis.  ----------------------------------------- 8:55 PM on 02/26/2018 -----------------------------------------  I did also order fentanyl for this patient but we did not needed as after Afrin he had no pain.  Nor did spike his blood pressure.  I have advised him he can use it for a day or 2 carefully with close eye on his blood pressure.  Patient has had no anginal discomfort.  Given that his symptoms all cleared with Afrin, CT verifies diagnosis we will send him home.  Return precautions follow-up given and understood    ____________________________________________   FINAL CLINICAL IMPRESSION(S) / ED DIAGNOSES  Final diagnoses:  None      This chart was dictated using voice recognition software.  Despite best efforts to proofread,  errors can occur which can change meaning.      Schuyler Amor, MD 02/26/18 2029    Schuyler Amor, MD 02/26/18 [phone]

## 2018-02-26 NOTE — ED Notes (Signed)
Patient transported to CT. Significant other remains in treatment room at this time.

## 2018-02-26 NOTE — Discharge Instructions (Addendum)
Return to the emergency room for any new or worsening symptoms including increased pain, fever, vomiting stiff neck or numbness or weakness change in vision or you feel worse in any way.  Take the antibiotics until they are gone and follow closely with Dr. Tami Ribas.

## 2018-03-01 ENCOUNTER — Telehealth: Payer: Self-pay | Admitting: Family Medicine

## 2018-03-01 MED ORDER — ONDANSETRON HCL 4 MG PO TABS: 4.0000 mg | ORAL_TABLET | Freq: Three times a day (TID) | ORAL | 0 refills | Status: DC | PRN

## 2018-03-01 NOTE — Telephone Encounter (Signed)
Can you schedule him on 9/12 at 11 am for hospital follow up? I will send in some zofran now.

## 2018-03-01 NOTE — Telephone Encounter (Signed)
TA-Pt is now scheduled for Thursday for a Hospital F/U/thx dmf

## 2018-03-01 NOTE — Telephone Encounter (Signed)
Pt had ER f/u set up with Jeffrey Spence. I called to get scheduled at [facility] for PCP but spouse [name] is very concerned about his current state. She said he has an ongoing headache that has not subsided, vomiting and diarrhea. She said he has not been able to keep anything down since Sat after ER visit. Please call pt to set up ER f/u and also she is requesting something for the nausea.

## 2018-03-02 ENCOUNTER — Ambulatory Visit: Payer: BLUE CROSS/BLUE SHIELD | Admitting: Family Medicine

## 2018-03-03 ENCOUNTER — Ambulatory Visit: Payer: BLUE CROSS/BLUE SHIELD | Admitting: Family Medicine

## 2018-03-03 ENCOUNTER — Encounter: Payer: Self-pay | Admitting: Family Medicine

## 2018-03-03 VITALS — BP 114/74 | HR 80 | Temp 97.7°F | Ht 65.0 in | Wt 178.0 lb

## 2018-03-03 DIAGNOSIS — J309 Allergic rhinitis, unspecified: Secondary | ICD-10-CM | POA: Diagnosis not present

## 2018-03-03 DIAGNOSIS — J0100 Acute maxillary sinusitis, unspecified: Secondary | ICD-10-CM

## 2018-03-03 MED ORDER — LEVOCETIRIZINE DIHYDROCHLORIDE 5 MG PO TABS: 5.0000 mg | ORAL_TABLET | Freq: Every evening | ORAL | 3 refills | Status: DC

## 2018-03-03 MED ORDER — FLUTICASONE PROPIONATE 50 MCG/ACT NA SUSP: 2.0000 | Freq: Every day | NASAL | 12 refills | Status: DC

## 2018-03-03 NOTE — Assessment & Plan Note (Signed)
Resolving. Finish course of prednisone. Discussed tx for allergic rhinitis to hopefully help prevent future sinus infections. See above.

## 2018-03-03 NOTE — Progress Notes (Signed)
Subjective:   Patient ID: Jeffrey Spence, male    DOB: 1960-12-15, 57 y.o.   MRN: [id number]  Jeffrey Spence is a pleasant 57 y.o. year old male who presents to clinic today with Hospitalization Follow-up (Patient is here today for a hospital F/U.  He was seen at [facility] on 9.7.19 presenting with eye pain and headache.  He was diagnosed with acute recurrent maxillary sinusitis and Tx with Augmentin bid x 10d, Oxy-APAP #4 and Prednison 3qam #12.  Advised to schedule with PCP as well as Otolaryngology office.  He has completed Prednisone, stopped Oxy-APAP, is still taking Abx as well as Afrin.  Pain in OS socket is gone but the nausea persists.  Drainage is "driving me nuts.")  on 03/03/2018  HPI:  ER follow up-  Went to [facility] on 02/26/18- note reviewed, acute onset of of left eye pain and severe headache.  The week prior, he complained of a stuffy nose and then had gradual onset of pain in maxillary and frontal sinuses.  No occular pain or changes in vision.  No high fevers or chills.  No focal numbness or or weakness.  NO slurred speech.  No shortness of breath.  Takes benadryl daily and was using Afrin.  Was told to stop taking Afrin in the ER.  Head CT showed left para sinusitis, otherwise unremarkable. Ct Head Wo Contrast  Addendum Date: 02/27/2018   ADDENDUM REPORT: 02/27/2018 16:09 ADDENDUM: Opacified LEFT maxillary, ethmoid and frontal sinuses noted compatible with sinusitis. Electronically Signed   By: Margarette Canada M.D.   On: 02/27/2018 16:09   Result Date: 02/27/2018 CLINICAL DATA:  57 year old male with acute LEFT headache for 2 days. EXAM: CT HEAD WITHOUT CONTRAST TECHNIQUE: Contiguous axial images were obtained from the base of the skull through the vertex without intravenous contrast. COMPARISON:  None. FINDINGS: Brain: No evidence of acute infarction, hemorrhage, hydrocephalus, extra-axial collection or mass lesion/mass effect. Vascular: Mild carotid atherosclerotic calcifications noted.  Skull: Normal. Negative for fracture or focal lesion. Sinuses/Orbits: No acute finding. Other: None. IMPRESSION: No evidence of acute intracranial abnormality. Electronically Signed: By: Margarette Canada M.D. On: 02/26/2018 18:20   Ct Maxillofacial Wo Contrast  Result Date: 02/26/2018 CLINICAL DATA:  57 year old male with acute headache, sinus pain and pressure. EXAM: CT MAXILLOFACIAL WITHOUT CONTRAST TECHNIQUE: Multidetector CT imaging of the maxillofacial structures was performed. Multiplanar CT image reconstructions were also generated. COMPARISON:  None FINDINGS: Osseous: No fracture or mandibular dislocation. No destructive process. Orbits: Negative. No traumatic or inflammatory finding. Sinuses: Opacified LEFT maxillary, ethmoid and frontal sinuses noted. Mucosal thickening within scattered RIGHT ethmoid air cells noted. The mastoid air cells and middle ears are clear. Soft tissues: Negative. Limited intracranial: No significant or unexpected finding. IMPRESSION: LEFT paranasal sinusitis. Electronically Signed   By: Margarette Canada M.D.   On: 02/26/2018 20:33   ENT consulted by phone, Dr. Tami Ribas who felt steroids and abx where best course.   Treated with Augmentin twice daily x 10 days, finished prednisone yesterday.  Was also given narcotic but only took two doses and not currently taking.  Pain stopped two days ago.  Still feels stuffy. He does have a history of allergic rhinitis and has intermittently used flonase. Typically oral benadryl is effective but it has not been over the last several weeks. Current Outpatient Medications on File Prior to Visit  Medication Sig Dispense Refill  . amoxicillin-clavulanate (AUGMENTIN) 875-125 MG tablet Take 1 tablet by mouth 2 (two) times daily for  10 days. 20 tablet 0  . aspirin 81 MG tablet Take 1 tablet (81 mg total) by mouth daily.    . Cyanocobalamin (B-12 PO) Take by mouth.    . metoprolol tartrate (LOPRESSOR) 25 MG tablet Take 0.5 tablets (12.5 mg total)  by mouth 2 (two) times daily. 90 tablet 3  . Multiple Vitamins-Minerals (MULTIVITAMIN WITH MINERALS) tablet Take 1 tablet by mouth daily.    . ondansetron (ZOFRAN) 4 MG tablet Take 1 tablet (4 mg total) by mouth every 8 (eight) hours as needed for nausea or vomiting. 20 tablet 0  . rivaroxaban (XARELTO) 2.5 MG TABS tablet Take 1 tablet (2.5 mg total) by mouth 2 (two) times daily. 180 tablet 3  . rosuvastatin (CRESTOR) 20 MG tablet TAKE 1 TABLET(20 MG) BY MOUTH DAILY 90 tablet 3   No current facility-administered medications on file prior to visit.     No Known Allergies  Past Medical History:  Diagnosis Date  . Arthritis   . CAD (coronary artery disease)    a. nstemi 1.2017; b. cardiac cath 06/2015 with LM and severe 3 veseel dz (full report pending)  . HLD (hyperlipidemia)   . NSTEMI (non-ST elevated myocardial infarction) ([place]) 06/2015  . Postoperative atrial fibrillation ([place]) 07/01/2017  . Tobacco abuse     Past Surgical History:  Procedure Laterality Date  . CARDIAC CATHETERIZATION Bilateral 07/23/2015   Procedure: Left Heart Cath and Coronary Angiography;  Surgeon: Minna Merritts, MD;  Location: [place] [facility];  Service: Cardiovascular;  Laterality: Bilateral;  . CORONARY ARTERY BYPASS GRAFT N/A 07/25/2015   Procedure: CORONARY ARTERY BYPASS GRAFTING X 3 UTILIZING BILATERAL IMA AND LEFT RADIAL ARTERY;  Surgeon: Melrose Nakayama, MD;  Location: [place];  Service: Open Heart Surgery;  Laterality: N/A;  . fractured toe Right 2013   great toe/with pin  . RADIAL ARTERY HARVEST Left 07/25/2015   Procedure: RADIAL ARTERY HARVEST;  Surgeon: Melrose Nakayama, MD;  Location: [place];  Service: Open Heart Surgery;  Laterality: Left;  . TEE WITHOUT CARDIOVERSION N/A 07/25/2015   Procedure: TRANSESOPHAGEAL ECHOCARDIOGRAM (TEE);  Surgeon: Melrose Nakayama, MD;  Location: [place];  Service: Open Heart Surgery;  Laterality: N/A;    Family History  Problem Relation Age of Onset  .  Heart attack Father 53  . Cancer Father        blood  . Rheum arthritis Mother   . Leukemia Paternal Uncle   . Colon cancer Neg Hx     Social History   Socioeconomic History  . Marital status: Married    Spouse name: Not on file  . Number of children: 7  . Years of education: Not on file  . 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 Financial resource strain: Not on file  . Food insecurity:    Worry: Not on file    Inability: Not on file  . Transportation needs:    Medical: Not on file    Non-medical: Not on file  Tobacco Use  . Smoking status: Former Smoker    Packs/day: 1.00    Years: 30.00    Pack years: 30.00    Types: Cigarettes    Last attempt to quit: 07/17/2015    Years since quitting: 2.6  . Smokeless tobacco: Never Used  Substance and Sexual Activity  . Alcohol use: Yes    Alcohol/week: 0.0 standard drinks  Comment: rare  . Drug use: No  . Sexual activity: Not on file  Lifestyle  . Physical activity:    Days per week: Not on file    Minutes per session: Not on file  . Stress: Not on file  Relationships  . Social connections:    Talks on phone: Not on file    Gets together: Not on file    Attends religious service: Not on file    Active member of club or organization: Not on file    Attends meetings of clubs or organizations: Not on file    Relationship status: Not on file  . Intimate partner violence:    Fear of current or ex partner: Not on file    Emotionally abused: Not on file    Physically abused: Not on file    Forced sexual activity: Not on file  Other Topics Concern  . Not on file  Social History Narrative   Regular exercise--yes   The PMH, PSH, Social History, Family History, Medications, and allergies have been reviewed in [facility], and have been updated if relevant.  Review of Systems  Constitutional: Negative.   HENT: Positive for congestion and postnasal drip.  Negative for nosebleeds, rhinorrhea, sinus pressure, sinus pain, sneezing, sore throat, tinnitus, trouble swallowing and voice change.   Respiratory: Negative.   Cardiovascular: Negative.   Musculoskeletal: Negative.   Neurological: Negative.   Hematological: Negative.   Psychiatric/Behavioral: Negative.   All other systems reviewed and are negative.      Objective:    BP 114/74 (BP Location: Left Arm, Patient Position: Sitting, Cuff Size: Normal)   Pulse 80   Temp 97.7 F (36.5 C) (Oral)   Ht 5\' 5"  (1.651 m)   Wt 178 lb (80.7 kg)   SpO2 97%   BMI 29.62 kg/m    Physical Exam  Constitutional: He is oriented to person, place, and time. He appears well-developed and well-nourished. No distress.  HENT:  Head: Normocephalic and atraumatic.  Eyes: EOM are normal.  Neck: Normal range of motion.  Cardiovascular: Normal rate and regular rhythm.  Pulmonary/Chest: Effort normal and breath sounds normal.  Musculoskeletal: Normal range of motion. He exhibits no edema.  Neurological: He is alert and oriented to person, place, and time. No cranial nerve deficit. Coordination normal.  Skin: Skin is warm and dry. He is not diaphoretic.  Psychiatric: He has a normal mood and affect. His behavior is normal. Judgment and thought content normal.  Nursing note and vitals reviewed.         Assessment & Plan:   Acute non-recurrent maxillary sinusitis  Allergic rhinitis, unspecified seasonality, unspecified trigger No follow-ups on file.

## 2018-03-03 NOTE — Assessment & Plan Note (Signed)
Deteriorated. D/c benadryl.  Start xyzal, restart flonase. Call or return to clinic prn if these symptoms worsen or fail to improve as anticipated. The patient indicates understanding of these issues and agrees with the plan.

## 2018-03-03 NOTE — Patient Instructions (Addendum)
Great to see you.  Start taking xyzal and flonase daily.  Keep me updated- call me in a couple of weeks, sooner if you have any questions or issues.

## 2018-05-02 ENCOUNTER — Encounter: Payer: Self-pay | Admitting: Family Medicine

## 2018-05-02 ENCOUNTER — Ambulatory Visit: Payer: BLUE CROSS/BLUE SHIELD | Admitting: Family Medicine

## 2018-05-02 VITALS — BP 104/68 | HR 70 | Temp 97.7°F | Ht 65.0 in | Wt 180.0 lb

## 2018-05-02 DIAGNOSIS — J309 Allergic rhinitis, unspecified: Secondary | ICD-10-CM

## 2018-05-02 NOTE — Assessment & Plan Note (Signed)
>  25 minutes spent in face to face time with patient, >50% spent in counselling or coordination of care Intermittent but progressing.  I do question if mold is the biggest contributor and I did advised they contact someone to come look their house to test it for mold. Continue current rxs, refer to ENT. Discussed also seeing an allergist- would need to stop beta blocker if they felt allergy shots were appropriate. The patient indicates understanding of these issues and agrees with the plan.
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
## 2018-05-02 NOTE — Patient Instructions (Signed)
Great to see you. We will call you with your ENT referral.

## 2018-06-02 ENCOUNTER — Ambulatory Visit: Payer: BLUE CROSS/BLUE SHIELD | Admitting: Physician Assistant

## 2018-06-02 ENCOUNTER — Encounter: Payer: Self-pay | Admitting: Physician Assistant

## 2018-06-02 VITALS — BP 148/74 | HR 85 | Ht 65.0 in | Wt 180.0 lb

## 2018-06-02 DIAGNOSIS — Z86010 Personal history of colonic polyps: Secondary | ICD-10-CM

## 2018-06-02 DIAGNOSIS — Z7901 Long term (current) use of anticoagulants: Secondary | ICD-10-CM | POA: Diagnosis not present

## 2018-06-02 DIAGNOSIS — H903 Sensorineural hearing loss, bilateral: Secondary | ICD-10-CM | POA: Diagnosis not present

## 2018-06-02 DIAGNOSIS — J343 Hypertrophy of nasal turbinates: Secondary | ICD-10-CM | POA: Diagnosis not present

## 2018-06-02 DIAGNOSIS — J324 Chronic pansinusitis: Secondary | ICD-10-CM | POA: Diagnosis not present

## 2018-06-02 DIAGNOSIS — Z01818 Encounter for other preprocedural examination: Secondary | ICD-10-CM | POA: Diagnosis not present

## 2018-06-02 DIAGNOSIS — H6121 Impacted cerumen, right ear: Secondary | ICD-10-CM | POA: Diagnosis not present

## 2018-06-02 MED ORDER — PEG-KCL-NACL-NASULF-NA ASC-C 140 G PO SOLR: 1.0000 | Freq: Once | ORAL | 0 refills | Status: AC

## 2018-06-02 NOTE — Progress Notes (Signed)
Reviewed and agree with management plan.  Jep Dyas T. Ava Tangney, MD FACG 

## 2018-06-02 NOTE — Patient Instructions (Addendum)
If you are age 57 or older, your body mass index should be between 23-30. Your Body mass index is 29.95 kg/m. If this is out of the aforementioned range listed, please consider follow up with your Primary Care Provider.  If you are age 70 or younger, your body mass index should be between 19-25. Your Body mass index is 29.95 kg/m. If this is out of the aformentioned range listed, please consider follow up with your Primary Care Provider.   You have been scheduled for a colonoscopy. Please follow written instructions given to you at your visit today.  Please pick up your prep supplies at the pharmacy within the next 1-3 days. If you use inhalers (even only as needed), please bring them with you on the day of your procedure. Your physician has requested that you go to www.startemmi.com and enter the access code given to you at your visit today. This web site gives a general overview about your procedure. However, you should still follow specific instructions given to you by our office regarding your preparation for the procedure.  We have sent the following medications to your pharmacy for you to pick up at your convenience: Plenvu  You will be contacted by our office prior to your procedure for directions on holding your Xarelto.  If you do not hear from our office 1 week prior to your scheduled procedure, please call [phone] to discuss.   Please call us back regarding a date for colonoscopy with Dr. Fuller Plan.  You are being referred a Dietitian.  They will contact you with an appointment.  Thank you for choosing me and [facility].   Ellouise Newer, PA-C

## 2018-06-02 NOTE — Progress Notes (Signed)
Chief Complaint: Preprocedural examination for a surveillance colonoscopy due to history of colon polyps in a patient on chronic anticoagulation  HPI:    Jeffrey Spence is a 57 year old male with a past medical history of CAD, status post stent placement and STEMI in January 2017 with a cardiac cath with severe 3 vessel disease, last echo 07/25/2015 with an LVEF 40-45% maintained on Plavix, known to Dr. Fuller Plan, who presents to clinic today for preprocedural visit for a surveillance colonoscopy.    06/03/2016 last office visit also for preprocedural visit.  At that time patient had a recent end STEMI earlier that year with 3 stents placed having been placed on Plavix.  Patient was scheduled for a colonoscopy December 27 in the [facility].  It was also recommended patient start a probiotic after time of his colonoscopy given some of his abdominal complaints that day and fiber.  He is advised to hold his Plavix for 5 days prior to time procedure.    06/17/2016 colonoscopy for history of greater than 10 adenomas, at that time finding of a one 4 mm polyp in the cecum, five 6-8 mm polyps in the sigmoid, transverse and ascending colon, internal hemorrhoids and one small nonbleeding colonic angiodysplastic lesion.  Pathology showed a mixture of tubular adenomas and sessile serrated polyps.  Repeat was recommended in 2 years for surveillance unless genetics evaluation indicated a sooner interval.    Today, the patient explains that he has no complaints.  Does tell me the last time that he had to do the prep it had an orange fizz flavor and he could hardly "get it down".  He asked if there is anything different to take.  Denies having genetic testing done for his history of multiple polyps.    Social history positive for going to the beach in the coming week and with his wife.    Denies fever, chills, weight loss, nausea, vomiting, heartburn, reflux, change in bowel habits or abdominal pain.  Past Medical History:    Diagnosis Date  . Arthritis   . CAD (coronary artery disease)    a. nstemi 1.2017; b. cardiac cath 06/2015 with LM and severe 3 veseel dz (full report pending)  . HLD (hyperlipidemia)   . NSTEMI (non-ST elevated myocardial infarction) ([place]) 06/2015  . Postoperative atrial fibrillation ([place]) 07/01/2017  . Tobacco abuse     Past Surgical History:  Procedure Laterality Date  . CARDIAC CATHETERIZATION Bilateral 07/23/2015   Procedure: Left Heart Cath and Coronary Angiography;  Surgeon: Minna Merritts, MD;  Location: [place] [facility];  Service: Cardiovascular;  Laterality: Bilateral;  . CORONARY ARTERY BYPASS GRAFT N/A 07/25/2015   Procedure: CORONARY ARTERY BYPASS GRAFTING X 3 UTILIZING BILATERAL IMA AND LEFT RADIAL ARTERY;  Surgeon: Melrose Nakayama, MD;  Location: [place];  Service: Open Heart Surgery;  Laterality: N/A;  . fractured toe Right 2013   great toe/with pin  . RADIAL ARTERY HARVEST Left 07/25/2015   Procedure: RADIAL ARTERY HARVEST;  Surgeon: Melrose Nakayama, MD;  Location: [place];  Service: Open Heart Surgery;  Laterality: Left;  . TEE WITHOUT CARDIOVERSION N/A 07/25/2015   Procedure: TRANSESOPHAGEAL ECHOCARDIOGRAM (TEE);  Surgeon: Melrose Nakayama, MD;  Location: [place];  Service: Open Heart Surgery;  Laterality: N/A;    Current Outpatient Medications  Medication Sig Dispense Refill  . aspirin 81 MG tablet Take 1 tablet (81 mg total) by mouth daily.    . Cyanocobalamin (B-12 PO) Take by mouth.    Marland Kitchen  fluticasone (FLONASE) 50 MCG/ACT nasal spray Place 2 sprays into both nostrils daily. In each nostril 16 g 12  . levocetirizine (XYZAL) 5 MG tablet Take 1 tablet (5 mg total) by mouth every evening. 30 tablet 3  . metoprolol tartrate (LOPRESSOR) 25 MG tablet Take 0.5 tablets (12.5 mg total) by mouth 2 (two) times daily. 90 tablet 3  . Multiple Vitamins-Minerals (MULTIVITAMIN WITH MINERALS) tablet Take 1 tablet by mouth daily.    . rivaroxaban (XARELTO) 2.5 MG TABS tablet  Take 1 tablet (2.5 mg total) by mouth 2 (two) times daily. 180 tablet 3  . rosuvastatin (CRESTOR) 20 MG tablet TAKE 1 TABLET(20 MG) BY MOUTH DAILY 90 tablet 3   No current facility-administered medications for this visit.     Allergies as of 06/02/2018  . (No Known Allergies)    Family History  Problem Relation Age of Onset  . Heart attack Father 60  . Cancer Father        blood  . Rheum arthritis Mother   . Leukemia Paternal Uncle   . Colon cancer Neg Hx     Social History   Socioeconomic History  . Marital status: Married    Spouse name: Not on file  . Number of children: 7  . Years of education: Not on file  . 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 Financial resource strain: Not on file  . Food insecurity:    Worry: Not on file    Inability: Not on file  . Transportation needs:    Medical: Not on file    Non-medical: Not on file  Tobacco Use  . Smoking status: Former Smoker    Packs/day: 1.00    Years: 30.00    Pack years: 30.00    Types: Cigarettes    Last attempt to quit: 07/17/2015    Years since quitting: 2.8  . Smokeless tobacco: Never Used  Substance and Sexual Activity  . Alcohol use: Yes    Alcohol/week: 0.0 standard drinks    Comment: rare  . Drug use: No  . Sexual activity: Not on file  Lifestyle  . Physical activity:    Days per week: Not on file    Minutes per session: Not on file  . Stress: Not on file  Relationships  . Social connections:    Talks on phone: Not on file    Gets together: Not on file    Attends religious service: Not on file    Active member of club or organization: Not on file    Attends meetings of clubs or organizations: Not on file    Relationship status: Not on file  . Intimate partner violence:    Fear of current or ex partner: Not on file    Emotionally abused: Not on file    Physically abused: Not on file    Forced sexual activity: Not  on file  Other Topics Concern  . Not on file  Social History Narrative   Regular exercise--yes    Review of Systems:    Constitutional: No weight loss, fever or chills Cardiovascular: No chest pain Respiratory: No SOB  Gastrointestinal: See HPI and otherwise negative   Physical Exam:  Vital signs: BP (!) 148/74   Pulse 85   Ht 5\' 5"  (1.651 m)   Wt 180 lb (81.6 kg)   BMI 29.95 kg/m   Constitutional:   Pleasant Caucasian male  appears to be in NAD, Well developed, Well nourished, alert and cooperative Respiratory: Respirations even and unlabored. Lungs clear to auscultation bilaterally.   No wheezes, crackles, or rhonchi.  Cardiovascular: Normal S1, S2. No MRG. Regular rate and rhythm. No peripheral edema, cyanosis or pallor.  Gastrointestinal:  Soft, nondistended, nontender. No rebound or guarding. Normal bowel sounds. No appreciable masses or hepatomegaly. Rectal:  Not performed.  Psychiatric: Demonstrates good judgement and reason without abnormal affect or behaviors.  MOST RECENT LABS AND IMAGING: CBC    Component Value Date/Time   WBC 12.2 (H) 02/26/2018 1652   RBC 4.54 02/26/2018 1652   HGB 14.3 02/26/2018 1652   HGB 10.7 (L) 08/08/2015 0931   HCT 41.8 02/26/2018 1652   HCT 33.1 (L) 08/08/2015 0931   PLT 321 02/26/2018 1652   PLT 851 (HH) 08/08/2015 0931   MCV 92.0 02/26/2018 1652   MCV 95 08/08/2015 0931   MCH 31.6 02/26/2018 1652   MCHC 34.3 02/26/2018 1652   RDW 13.7 02/26/2018 1652   RDW 14.5 08/08/2015 0931   LYMPHSABS 1.4 02/26/2018 1652   LYMPHSABS 1.7 08/08/2015 0931   MONOABS 0.7 02/26/2018 1652   EOSABS 0.1 02/26/2018 1652   EOSABS 0.1 08/08/2015 0931   BASOSABS 0.0 02/26/2018 1652   BASOSABS 0.0 08/08/2015 0931    CMP     Component Value Date/Time   NA 140 02/26/2018 1652   K 3.7 02/26/2018 1652   CL 109 02/26/2018 1652   CO2 23 02/26/2018 1652   GLUCOSE 126 (H) 02/26/2018 1652   BUN 18 02/26/2018 1652   CREATININE 0.77 02/26/2018 1652    CALCIUM 8.9 02/26/2018 1652   PROT 7.8 02/26/2018 1652   PROT 6.4 11/22/2015 0857   ALBUMIN 4.1 02/26/2018 1652   ALBUMIN 4.2 11/22/2015 0857   AST 28 02/26/2018 1652   ALT 52 (H) 02/26/2018 1652   ALKPHOS 178 (H) 02/26/2018 1652   BILITOT 0.8 02/26/2018 1652   BILITOT 0.2 11/22/2015 0857   GFRNONAA >60 02/26/2018 1652   GFRAA >60 02/26/2018 1652    Assessment: 1.  History of tubular adenomas: Multiple, greater than 10 adenomas and mixture of sessile serrated polyps, patient has colonoscopies every 2 years, last in 05/2016 2.  Chronic anticoagulation: Status post stent placement in 2017 on Xarelto  Plan: 1.  Dr. Fuller Plan discuss genetic testing given amount of polyps, this has not been done yet, will send referral to genetics counselor at the cancer center. 2.  Scheduled patient for a surveillance colonoscopy in the [place] with Dr. Fuller Plan.  Did discuss risks, benefits, limitations and alternatives the patient agrees to proceed. 3.  Patient was was told to hold Xarelto for 2 days, we will consult with his prescribing physician to ensure that holding his Xarelto is acceptable for him. 4.  Patient to follow in clinic per recommendations from Dr. Fuller Plan after time of procedure.  Ellouise Newer, PA-C [facility] 06/02/2018, 9:14 AM  Cc: Lucille Passy, MD

## 2018-06-16 DIAGNOSIS — H903 Sensorineural hearing loss, bilateral: Secondary | ICD-10-CM | POA: Diagnosis not present

## 2018-06-21 DIAGNOSIS — H903 Sensorineural hearing loss, bilateral: Secondary | ICD-10-CM | POA: Diagnosis not present

## 2018-07-06 ENCOUNTER — Telehealth: Payer: Self-pay

## 2018-07-06 ENCOUNTER — Other Ambulatory Visit: Payer: Self-pay

## 2018-07-06 DIAGNOSIS — Z7901 Long term (current) use of anticoagulants: Secondary | ICD-10-CM

## 2018-07-06 DIAGNOSIS — D126 Benign neoplasm of colon, unspecified: Secondary | ICD-10-CM

## 2018-07-06 NOTE — Telephone Encounter (Signed)
[facility] Pre-operative Risk Assessment     Request for surgical clearance:     Endoscopy Procedure  What type of surgery is being performed?     Colonoscopy  When is this surgery scheduled?     07/13/18  What type of clearance is required ?   Pharmacy  Are there any medications that need to be held prior to surgery and how long? HOLD XARELTO FOR 2 DAYS PRIOR  Practice name and name of physician performing surgery?      [facility]/Dr. Fuller Plan  What is your office phone and fax number?      Phone- [phone]  [phone]  Anesthesia type (None, local, MAC, general) ?       MAC

## 2018-07-07 ENCOUNTER — Telehealth: Payer: Self-pay | Admitting: Cardiovascular Disease

## 2018-07-07 ENCOUNTER — Telehealth: Payer: Self-pay | Admitting: Gastroenterology

## 2018-07-07 NOTE — Telephone Encounter (Signed)
° °  [facility] Pre-operative Risk Assessment    Request for surgical clearance:  1. What type of surgery is being performed? Endoscopy procedure - Colonoscopy    2. When is this surgery scheduled? 07/13/2018   3. What type of clearance is required (medical clearance vs. Pharmacy clearance to hold med vs. Both)? Pharmacy   4. Are there any medications that need to be held prior to surgery and how long? Hold xarelto for 2 days   5. Practice name and name of physician performing surgery? [facility], Dr Fuller Plan   6. What is your office phone number [phone]    7.   What is your office fax number [phone]  8.   Anesthesia type (None, local, MAC, general) ? MAC   Ace Gins 07/07/2018, 3:16 PM  _________________________________________________________________   (provider comments below)

## 2018-07-07 NOTE — Telephone Encounter (Signed)
Jeffrey Spence from [employer] counseling at [facility] returned your call and would like for you to gv her a call back.

## 2018-07-08 NOTE — Telephone Encounter (Signed)
Patient with diagnosis of CAD on Xarelto 2.5mg  BID. Will route to Dr. Rockey Situ to address anticoagulation as this is not in our protocol at this time.

## 2018-07-08 NOTE — Telephone Encounter (Signed)
   Dr. Rockey Situ, can you comment on whether patient can hold xarelto x2 days prior to upcoming colonoscopy scheduled for 07/13/18? Appears he is on low dose xarelto for CAD.   Please route response back to the pre-op pool.   Thank you!

## 2018-07-10 NOTE — Telephone Encounter (Signed)
Ok to stop xarelto 2 days prior  Would suggest he stay on asa thx TG

## 2018-07-11 NOTE — Telephone Encounter (Signed)
Spoke with patient this morning regarding holding Xarelto 2 days prior to procedure.  Per Dr. Rockey Situ okay to hold but stay on Aspirin.  Patient verbalized understanding.

## 2018-07-12 ENCOUNTER — Telehealth: Payer: Self-pay

## 2018-07-12 ENCOUNTER — Encounter: Payer: Self-pay | Admitting: Gastroenterology

## 2018-07-12 ENCOUNTER — Encounter: Payer: Self-pay | Admitting: Licensed Clinical Social Worker

## 2018-07-12 ENCOUNTER — Ambulatory Visit (AMBULATORY_SURGERY_CENTER): Payer: BLUE CROSS/BLUE SHIELD | Admitting: Gastroenterology

## 2018-07-12 VITALS — BP 116/65 | HR 60 | Temp 98.0°F | Resp 10 | Ht 65.0 in | Wt 180.0 lb

## 2018-07-12 DIAGNOSIS — Z86010 Personal history of colonic polyps: Secondary | ICD-10-CM

## 2018-07-12 DIAGNOSIS — Z860101 Personal history of adenomatous and serrated colon polyps: Secondary | ICD-10-CM

## 2018-07-12 DIAGNOSIS — D124 Benign neoplasm of descending colon: Secondary | ICD-10-CM

## 2018-07-12 DIAGNOSIS — D123 Benign neoplasm of transverse colon: Secondary | ICD-10-CM | POA: Diagnosis not present

## 2018-07-12 DIAGNOSIS — D122 Benign neoplasm of ascending colon: Secondary | ICD-10-CM | POA: Diagnosis not present

## 2018-07-12 MED ORDER — SODIUM CHLORIDE 0.9 % IV SOLN: 500.0000 mL | Freq: Once | INTRAVENOUS | Status: DC

## 2018-07-12 NOTE — Op Note (Signed)
[facility] Patient Name: Hercules Hasler Procedure Date: 07/12/2018 2:16 PM MRN: [id number] Endoscopist: Ladene Artist , MD Age: 58 Referring MD:  Date of Birth: Nov 19, 1960 Gender: Male Account #: [id number] Procedure:                Colonoscopy Indications:              Surveillance: Personal history of adenomatous                            polyps on last colonoscopy 3 years ago Medicines:                Monitored Anesthesia Care Procedure:                Pre-Anesthesia Assessment:                           - Prior to the procedure, a History and Physical                            was performed, and patient medications and                            allergies were reviewed. The patient's tolerance of                            previous anesthesia was also reviewed. The risks                            and benefits of the procedure and the sedation                            options and risks were discussed with the patient.                            All questions were answered, and informed consent                            was obtained. Prior Anticoagulants: The patient has                            taken Xarelto (rivaroxaban), last dose was 2 days                            prior to procedure. ASA Grade Assessment: III - A                            patient with severe systemic disease. After                            reviewing the risks and benefits, the patient was                            deemed in satisfactory condition to undergo the  procedure.                           After obtaining informed consent, the colonoscope                            was passed under direct vision. Throughout the                            procedure, the patient's blood pressure, pulse, and                            oxygen saturations were monitored continuously. The                            Colonoscope was introduced through the anus and                  advanced to the the cecum, identified by                            appendiceal orifice and ileocecal valve. The                            ileocecal valve, appendiceal orifice, and rectum                            were photographed. The quality of the bowel                            preparation was good. The colonoscopy was performed                            without difficulty. The patient tolerated the                            procedure well. Scope In: 2:25:29 PM Scope Out: 2:43:43 PM Scope Withdrawal Time: 0 hours 16 minutes 8 seconds  Total Procedure Duration: 0 hours 18 minutes 14 seconds  Findings:                 The perianal and digital rectal examinations were                            normal.                           A 4 mm polyp was found in the ascending colon. The                            polyp was sessile. The polyp was removed with a                            cold biopsy forceps. Resection and retrieval were  complete.                           Four sessile polyps were found in the descending                            colon (1) and transverse colon (3). The polyps were                            6 to 8 mm in size. These polyps were removed with a                            cold snare. Resection and retrieval were complete.                           Internal hemorrhoids were found during                            retroflexion. The hemorrhoids were small and Grade                            I (internal hemorrhoids that do not prolapse).                           The exam was otherwise without abnormality on                            direct and retroflexion views. Complications:            No immediate complications. Estimated blood loss:                            None. Estimated Blood Loss:     Estimated blood loss: none. Impression:               - One 4 mm polyp in the ascending colon, removed                             with a cold biopsy forceps. Resected and retrieved.                           - Four 6 to 8 mm polyps in the descending colon and                            in the transverse colon, removed with a cold snare.                            Resected and retrieved.                           - Internal hemorrhoids.                           - The examination was otherwise normal on direct  and retroflexion views. Recommendation:           - Repeat colonoscopy in 3 years for surveillance.                           - Resume Xarelto (rivaroxaban) tomorrow at prior                            dose. Refer to managing physician for further                            adjustment of therapy.                           - Patient has a contact number available for                            emergencies. The signs and symptoms of potential                            delayed complications were discussed with the                            patient. Return to normal activities tomorrow.                            Written discharge instructions were provided to the                            patient.                           - Resume previous diet.                           - Continue present medications.                           - Await pathology results. Ladene Artist, MD 07/12/2018 2:48:07 PM This report has been signed electronically.

## 2018-07-12 NOTE — Telephone Encounter (Signed)
Per Dr Rockey Situ- OK to hold Xarelto 48 hours pre colonoscopy but patient should continue on aspirin.  Kerin Ransom PA-C 07/12/2018 3:56 PM

## 2018-07-12 NOTE — Patient Instructions (Signed)
Handouts given for polyps and hemorrhoids.  Resume Xarelto tomorrow at prior dose.  YOU HAD AN ENDOSCOPIC PROCEDURE TODAY AT [facility]:   Refer to the procedure report that was given to you for any specific questions about what was found during the examination.  If the procedure report does not answer your questions, please call your gastroenterologist to clarify.  If you requested that your care partner not be given the details of your procedure findings, then the procedure report has been included in a sealed envelope for you to review at your convenience later.  YOU SHOULD EXPECT: Some feelings of bloating in the abdomen. Passage of more gas than usual.  Walking can help get rid of the air that was put into your GI tract during the procedure and reduce the bloating. If you had a lower endoscopy (such as a colonoscopy or flexible sigmoidoscopy) you may notice spotting of blood in your stool or on the toilet paper. If you underwent a bowel prep for your procedure, you may not have a normal bowel movement for a few days.  Please Note:  You might notice some irritation and congestion in your nose or some drainage.  This is from the oxygen used during your procedure.  There is no need for concern and it should clear up in a day or so.  SYMPTOMS TO REPORT IMMEDIATELY:   Following lower endoscopy (colonoscopy or flexible sigmoidoscopy):  Excessive amounts of blood in the stool  Significant tenderness or worsening of abdominal pains  Swelling of the abdomen that is new, acute  Fever of 100F or higher  For urgent or emergent issues, a gastroenterologist can be reached at any hour by calling [phone].   DIET:  We do recommend a small meal at first, but then you may proceed to your regular diet.  Drink plenty of fluids but you should avoid alcoholic beverages for 24 hours.  ACTIVITY:  You should plan to take it easy for the rest of today and you should NOT DRIVE or use heavy  machinery until tomorrow (because of the sedation medicines used during the test).    FOLLOW UP: Our staff will call the number listed on your records the next business day following your procedure to check on you and address any questions or concerns that you may have regarding the information given to you following your procedure. If we do not reach you, we will leave a message.  However, if you are feeling well and you are not experiencing any problems, there is no need to return our call.  We will assume that you have returned to your regular daily activities without incident.  If any biopsies were taken you will be contacted by phone or by letter within the next 1-3 weeks.  Please call us at [phone] if you have not heard about the biopsies in 3 weeks.    SIGNATURES/CONFIDENTIALITY: You and/or your care partner have signed paperwork which will be entered into your electronic medical record.  These signatures attest to the fact that that the information above on your After Visit Summary has been reviewed and is understood.  Full responsibility of the confidentiality of this discharge information lies with you and/or your care-partner.

## 2018-07-12 NOTE — Progress Notes (Signed)
A/ox3, pleased with MAC, report to RN 

## 2018-07-12 NOTE — Telephone Encounter (Signed)
Santiago Glad called this morning from [facility] (Genetic counseling) appointment made for 07/25/18 at 11 am at [facility].  I called patient with appointment and told him he would be receiving paperwork from [facility].  Patient verbalized understanding.

## 2018-07-12 NOTE — Progress Notes (Signed)
Called to room to assist during endoscopic procedure.  Patient ID and intended procedure confirmed with present staff. Received instructions for my participation in the procedure from the performing physician.  

## 2018-07-13 ENCOUNTER — Telehealth: Payer: Self-pay | Admitting: *Deleted

## 2018-07-13 NOTE — Telephone Encounter (Signed)
  Follow up Call-  Call back number 07/12/2018 06/17/2016  Post procedure Call Back phone  # [phone]  Permission to leave phone message Yes Yes  Some recent data might be hidden     Patient questions:  Do you have a fever, pain , or abdominal swelling? No. Pain Score  0 *  Have you tolerated food without any problems? Yes.    Have you been able to return to your normal activities? Yes.    Do you have any questions about your discharge instructions: Diet   No. Medications  No. Follow up visit  No.  Do you have questions or concerns about your Care? No.  Actions: * If pain score is 4 or above: No action needed, pain <4.

## 2018-07-18 ENCOUNTER — Encounter: Payer: Self-pay | Admitting: Gastroenterology

## 2018-07-25 ENCOUNTER — Inpatient Hospital Stay: Payer: BLUE CROSS/BLUE SHIELD

## 2018-07-25 ENCOUNTER — Inpatient Hospital Stay: Payer: BLUE CROSS/BLUE SHIELD | Attending: Oncology | Admitting: Licensed Clinical Social Worker

## 2018-07-25 ENCOUNTER — Inpatient Hospital Stay (HOSPITAL_BASED_OUTPATIENT_CLINIC_OR_DEPARTMENT_OTHER): Payer: BLUE CROSS/BLUE SHIELD | Admitting: Medical

## 2018-07-25 ENCOUNTER — Encounter: Payer: Self-pay | Admitting: Licensed Clinical Social Worker

## 2018-07-25 VITALS — BP 50/38 | HR 46 | Temp 97.7°F | Resp 22

## 2018-07-25 DIAGNOSIS — Z806 Family history of leukemia: Secondary | ICD-10-CM | POA: Diagnosis not present

## 2018-07-25 DIAGNOSIS — Z860100 Personal history of colon polyps, unspecified: Secondary | ICD-10-CM | POA: Insufficient documentation

## 2018-07-25 DIAGNOSIS — R55 Syncope and collapse: Secondary | ICD-10-CM

## 2018-07-25 DIAGNOSIS — Z7183 Encounter for nonprocreative genetic counseling: Secondary | ICD-10-CM

## 2018-07-25 NOTE — Progress Notes (Signed)
PA Lucianne Lei and RN Learta Codding called to phlebotomy area around 1200.  Pt reported feeling faint after peripheral stick for research labs.  Pt does not have an oncologist here.  Reported having no food or drink all day.  Denies hx DM or syncope.  VS taken and charted.  Pt diaphoretic but A&Ox4.  Afebrile.  Denies CP or SOB.  No unilat weakness or facial droop or slurred speech noted or changes in pupils.  PA Lucianne Lei assessed.  Pt given coke and chips.  Reports feeling better.  VS stabilized after resting for a few minutes.  Phlebotomy instructed to call back if pt has any other issues or questions/concerns.

## 2018-07-25 NOTE — Progress Notes (Signed)
REFERRING PROVIDER: Levin Erp, PA [address] [place], [place] [address]  PRIMARY PROVIDER:  Lucille Passy, MD  PRIMARY REASON FOR VISIT:  1. Personal history of colonic polyps   2. Family history of leukemia      HISTORY OF PRESENT ILLNESS:   Jeffrey Spence, a 58 y.o. male, was seen for a [facility] cancer genetics consultation  due to a personal history of colon polyps.  Jeffrey Spence presents to clinic today to discuss the possibility of a hereditary predisposition to cancer, genetic testing, and to further clarify his future cancer risks, as well as potential cancer risks for family members.    Jeffrey Spence is a 58 y.o. male with no personal history of cancer. In 2014 he had a colonoscopy that revealed at least 2 tubular adenomas. A 2016 colonoscopy revealed 17 polyps, the majority were adenomatous polyps with one hyperplastic polyps. A 2017 colonoscopy revealed 6 polyps, mostly tubular adenomas and a sessile serrated. His 2020 colonoscopy revealed 5 tubular adenomas. Cumulatively, Jeffrey Spence has had at least 30 colon polyps, with approximately 28 of those being tubular adenomas.  He reports that he has not had prostate cancer screening. He drinks occasionally and has quit smoking.   CANCER HISTORY:   No history exists.    Past Medical History:  Diagnosis Date  . Arthritis   . Blood transfusion without reported diagnosis 2017   triple by pass  . CAD (coronary artery disease)    a. nstemi 1.2017; b. cardiac cath 06/2015 with LM and severe 3 veseel dz (full report pending)  . Family history of leukemia   . HLD (hyperlipidemia)    pt. denies  . NSTEMI (non-ST elevated myocardial infarction) ([facility]) 06/2015  . Personal history of colonic polyps   . Postoperative atrial fibrillation ([facility]) 07/01/2017  . Tobacco abuse     Past Surgical History:  Procedure Laterality Date  . CARDIAC CATHETERIZATION Bilateral 07/23/2015   Procedure: Left Heart Cath and Coronary  Angiography;  Surgeon: Minna Merritts, MD;  Location: [facility];  Service: Cardiovascular;  Laterality: Bilateral;  . CORONARY ARTERY BYPASS GRAFT N/A 07/25/2015   Procedure: CORONARY ARTERY BYPASS GRAFTING X 3 UTILIZING BILATERAL IMA AND LEFT RADIAL ARTERY;  Surgeon: Melrose Nakayama, MD;  Location: [facility];  Service: Open Heart Surgery;  Laterality: N/A;  . fractured toe Right 2013   great toe/with pin  . RADIAL ARTERY HARVEST Left 07/25/2015   Procedure: RADIAL ARTERY HARVEST;  Surgeon: Melrose Nakayama, MD;  Location: [facility];  Service: Open Heart Surgery;  Laterality: Left;  . TEE WITHOUT CARDIOVERSION N/A 07/25/2015   Procedure: TRANSESOPHAGEAL ECHOCARDIOGRAM (TEE);  Surgeon: Melrose Nakayama, MD;  Location: [facility];  Service: Open Heart Surgery;  Laterality: N/A;    Social History   Socioeconomic History  . Marital status: Married    Spouse name: Not on file  . Number of children: 7  . Years of education: Not on file  . 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 Financial resource strain: Not on file  . Food insecurity:    Worry: Not on file    Inability: Not on file  . Transportation needs:    Medical: Not on file    Non-medical: Not on file  Tobacco Use  . Smoking status: Former Smoker    Packs/day: 1.00    Years: 30.00  Pack years: 30.00    Types: Cigarettes    Last attempt to quit: 07/17/2015    Years since quitting: 3.0  . Smokeless tobacco: Never Used  Substance and Sexual Activity  . Alcohol use: Yes    Alcohol/week: 0.0 standard drinks    Comment: rare  . Drug use: No  . Sexual activity: Not on file  Lifestyle  . Physical activity:    Days per week: Not on file    Minutes per session: Not on file  . Stress: Not on file  Relationships  . Social connections:    Talks on phone: Not on file    Gets together: Not on file    Attends religious service: Not on file    Active  member of club or organization: Not on file    Attends meetings of clubs or organizations: Not on file    Relationship status: Not on file  Other Topics Concern  . Not on file  Social History Narrative   Regular exercise--yes     FAMILY HISTORY:  We obtained a detailed, 4-generation family history.  Significant diagnoses are listed below: Family History  Problem Relation Age of Onset  . Heart attack Father 75  . Cancer Father        blood  . Rheum arthritis Mother   . Leukemia Paternal Uncle   . Cancer Daughter        Vulvar (d. 3)  . Colon cancer Neg Hx    Jeffrey Spence has 7 children, 3 daughters and 4 sons. One of his daughters passed away recently from vulvar cancer at the age of 64. He does not have brothers/sisters.  Jeffrey Spence mother is living at 28. She had two brothers and two sisters, none have had cancer. No cancer in the patient's maternal cousins. His maternal grandparents both passed away in their late 63s.  Jeffrey Spence father died at 67. He had MDS. He had a brother and a sister. Jeffrey Spence uncle died of leukemia in his 39s. No cancers in his paternal cousins. His paternal grandfather died at 70, grandmother died in her 93s.  Jeffrey Spence is unaware of previous family history of genetic testing for hereditary cancer risks. Patient's maternal ancestors are of American Indian/English descent, and paternal ancestors are of [place] descent. There is no reported Ashkenazi Jewish ancestry. There is no known consanguinity.  GENETIC COUNSELING ASSESSMENT: Jeffrey Spence is a 58 y.o. male with a personal history of colon polyps which is somewhat suggestive of a Hereditary Cancer Predisposition Syndrome. We, therefore, discussed and recommended the following at today's visit.   DISCUSSION: We discussed that polyps in general are common, however, most people have fewer than 5 lifetime polyps.  When an individual has 10 or more polyps we become concerned about an  underlying polyposis syndrome.  The most common hereditary polyposis syndromes are caused by problems in the APC and MUTYH genes, however, more recently, mutations in the [place] and MSH3 genes have been identified in some polyposis families.     We reviewed the characteristics, features and inheritance patterns of hereditary cancer syndromes. We also discussed genetic testing, including the appropriate family members to test, the process of testing, insurance coverage and turn-around-time for results. We discussed the implications of a negative, positive and/or variant of uncertain significant result. We recommended Jeffrey Spence pursue genetic testing for the [facility] Common Hereditary Cancers Panel + Colorectal Cancer Panel panel.   The Common Hereditary Cancers Panel offered by [facility]  includes sequencing and/or deletion duplication testing of the following 47 genes: APC, ATM, AXIN2, BARD1, BMPR1A, BRCA1, BRCA2, BRIP1, CDH1, CDKN2A (p14ARF), CDKN2A (p16INK4a), CKD4, CHEK2, CTNNA1, DICER1, EPCAM (Deletion/duplication testing only), GREM1 (promoter region deletion/duplication testing only), KIT, MEN1, MLH1, MSH2, MSH3, MSH6, MUTYH, NBN, NF1, NHTL1, PALB2, PDGFRA, PMS2, POLD1, POLE, PTEN, RAD50, RAD51C, RAD51D, SDHB, SDHC, SDHD, SMAD4, SMARCA4. STK11, TP53, TSC1, TSC2, and VHL.  The following genes were evaluated for sequence changes only: SDHA and HOXB13 c.251G>A variant only.  The Colorectal Cancer Panel offered by [facility] includes sequencing and/or deletion duplication testing of the following 30 genes: APC, AXIN2, BMPR1A, CDH1, CHEK2, EPCAM, GREM1, MLH1, MSH2, MSH3, MSH6, MUTYH, NTHL1, PMS2, POLD1, POLE, PTEN, SMAD4, STK11, TP53, ATM, BLM, BUB1B, CEP57, ENG, FLCN, GALNT12, MLH3, RNF43, RPS20.  We discussed that if he is found to have a mutation in one of these genes, it may impact future medical management recommendations such as increased cancer screenings and consideration of risk reducing surgeries.  A  positive result could also have implications for the patient's family members.   A Negative result would mean we were unable to identify a hereditary component to his development of adenomatous polyps, but does not rule out the possibility of a hereditary basis for his polyps. There could be mutations that are undetectable by current technology, or in genes not yet tested or identified to increase cancer risk.     We discussed the potential to find a Variant of Uncertain Significance or VUS.  These are variants that have not yet been identified as pathogenic or benign, and it is unknown if this variant is associated with increased cancer risk or if this is a normal finding.  Most VUS's are reclassified to benign or likely benign.   It should not be used to make medical management decisions. With time, we suspect the lab will determine the significance of any VUS's identified if any.   Based on Jeffrey Spence personal history of polyps he meets NCCN medical criteria for genetic testing. Despite that he meets criteria, he may still have an out of pocket cost. The lab will notify him of an OOP if any.  We discussed that some people do not want to undergo genetic testing due to fear of genetic discrimination.  A federal law called the Genetic Information Non-Discrimination Act (GINA) of 2008 helps protect individuals against genetic discrimination based on their genetic test results.  It impacts both health insurance and employment.  For health insurance, it protects against increased premiums, being kicked off insurance or being forced to take a test in order to be insured.  For employment it protects against hiring, firing and promoting decisions based on genetic test results.  Health status due to a cancer diagnosis is not protected under GINA.  This law does not protect life insurance, disability insurance, or other types of insurance.   PLAN: After considering the risks, benefits, and limitations, Jeffrey Spence.  Spence  provided informed consent to pursue genetic testing and the blood sample was sent to [facility] for analysis of the Common Hereditary Cancers Panel + Colorectal Cancer Panel. Results should be available within approximately 2-3 weeks' time, at which point they will be disclosed by telephone to Jeffrey Spence, as will any additional recommendations warranted by these results. Jeffrey Spence will receive a summary of his genetic counseling visit and a copy of his results once available. This information will also be available in Epic.   Lastly, we encouraged Jeffrey Spence to remain in  contact with cancer genetics annually so that we can continuously update the family history and inform him of any changes in cancer genetics and testing that may be of benefit for this family.   Jeffrey Spence.  Rami questions were answered to his satisfaction today. Our contact information was provided should additional questions or concerns arise. Thank you for the referral and allowing [place] to share in the care of your patient.   Faith Rogue, MS Genetic Counselor [email or web address] Phone: [phone]  The patient was seen for a total of 25 minutes in face-to-face genetic counseling.

## 2018-07-27 ENCOUNTER — Encounter: Payer: Self-pay | Admitting: Medical

## 2018-07-27 NOTE — Progress Notes (Signed)
The patient was seen in phlebotomy.  He was having his blood drawn.  He had not had anything to eat all day.  He became diaphoretic and hypotensive.  His blood pressure was 50/38 with a pulse of 99 initially.  He was given a soft drink and potato chips.  His symptoms abated.  He was able to leave.  Sandi Mealy, MHS, PA-C Physician Assistant

## 2018-08-15 ENCOUNTER — Telehealth: Payer: Self-pay | Admitting: Licensed Clinical Social Worker

## 2018-08-22 DIAGNOSIS — H903 Sensorineural hearing loss, bilateral: Secondary | ICD-10-CM | POA: Diagnosis not present

## 2018-08-25 ENCOUNTER — Telehealth: Payer: Self-pay

## 2018-08-25 DIAGNOSIS — J329 Chronic sinusitis, unspecified: Secondary | ICD-10-CM

## 2018-08-25 NOTE — Telephone Encounter (Signed)
Dr. Deborra Medina,  Please advise on previous message. Since pt wants to go to ENT in [place] unsure if you can still use old referral from Nov

## 2018-08-25 NOTE — Telephone Encounter (Signed)
New referral placed.   Thank you!

## 2018-08-25 NOTE — Telephone Encounter (Signed)
Copied from [facility] [phone]. Topic: Referral - Request for Referral >> Aug 25, 2018  9:04 AM Ivar Drape wrote: Has patient seen PCP for this complaint?   YES *If NO, is insurance requiring patient see PCP for this issue before PCP can refer them? Referral for which specialty:  ENT Preferred provider/office:  [facility] office Reason for referral:  Continuous Sinus Problems  Patient was referred to a ENT in [place] but he did not like what he had to say.

## 2018-08-25 NOTE — Addendum Note (Signed)
Addended by: Lucille Passy on: 08/25/2018 04:04 PM   Modules accepted: Orders

## 2018-08-30 ENCOUNTER — Telehealth: Payer: Self-pay | Admitting: Cardiovascular Disease

## 2018-08-30 DIAGNOSIS — Z806 Family history of leukemia: Secondary | ICD-10-CM

## 2018-08-30 DIAGNOSIS — I25110 Atherosclerotic heart disease of native coronary artery with unstable angina pectoris: Secondary | ICD-10-CM

## 2018-08-30 DIAGNOSIS — R5383 Other fatigue: Secondary | ICD-10-CM

## 2018-08-30 DIAGNOSIS — E538 Deficiency of other specified B group vitamins: Secondary | ICD-10-CM

## 2018-08-30 NOTE — Telephone Encounter (Signed)
Patient has upcoming lab appt and wants to add on a panel to check vitamin and mineral level .  Please advise scheduling if orders to be linked.

## 2018-08-30 NOTE — Telephone Encounter (Signed)
Last OV with Dr Rockey Situ 2/21.  Do not see any orders at this time.   Called patient. States he's been feeling out of sorts lately. He would like other lab work such as B12 and any others along with LIPID, LIVER that Dr Rockey Situ usually orders. Advised him that some lab work is typically drawn through PCP so they can treat accordingly. Advised I will ask Dr Rockey Situ if ok to have lab work prior to appt and what labs we can draw.

## 2018-09-01 NOTE — Telephone Encounter (Signed)
Will cc Dr. Deborra Medina Looks like he has an appt with me on 3/18 then Dr. Deborra Medina on 3/25 for same issue "feeling run down, fatigue" Will see if there are labs that she would like to order, perhaps we can do in one needle stick B12, tsh, free testosterone? thx TG

## 2018-09-02 NOTE — Telephone Encounter (Signed)
Selinda Eon, can you help coordinate?  We can definitely add B12, Vit D, TSH, testosterone, cbc, ferritin.

## 2018-09-02 NOTE — Telephone Encounter (Signed)
TG-I have created future order for Vit B12; Vit-Shae Hinnenkamp; TSH; Testosterone; CBC; Ferritin and have you CC'ed so they should come to your in-box as well as TA's/thx dmf

## 2018-09-05 ENCOUNTER — Ambulatory Visit: Payer: Self-pay | Admitting: Licensed Clinical Social Worker

## 2018-09-05 ENCOUNTER — Other Ambulatory Visit (INDEPENDENT_AMBULATORY_CARE_PROVIDER_SITE_OTHER): Payer: BLUE CROSS/BLUE SHIELD | Admitting: *Deleted

## 2018-09-05 ENCOUNTER — Other Ambulatory Visit: Payer: Self-pay

## 2018-09-05 ENCOUNTER — Encounter: Payer: Self-pay | Admitting: Licensed Clinical Social Worker

## 2018-09-05 ENCOUNTER — Other Ambulatory Visit: Payer: Self-pay | Admitting: *Deleted

## 2018-09-05 DIAGNOSIS — Z806 Family history of leukemia: Secondary | ICD-10-CM | POA: Diagnosis not present

## 2018-09-05 DIAGNOSIS — R5383 Other fatigue: Secondary | ICD-10-CM

## 2018-09-05 DIAGNOSIS — Z79899 Other long term (current) drug therapy: Secondary | ICD-10-CM

## 2018-09-05 DIAGNOSIS — E782 Mixed hyperlipidemia: Secondary | ICD-10-CM | POA: Diagnosis not present

## 2018-09-05 DIAGNOSIS — E538 Deficiency of other specified B group vitamins: Secondary | ICD-10-CM

## 2018-09-05 DIAGNOSIS — I25110 Atherosclerotic heart disease of native coronary artery with unstable angina pectoris: Secondary | ICD-10-CM | POA: Diagnosis not present

## 2018-09-05 DIAGNOSIS — Z1379 Encounter for other screening for genetic and chromosomal anomalies: Secondary | ICD-10-CM | POA: Insufficient documentation

## 2018-09-05 NOTE — Progress Notes (Signed)
Genetic Test Results  HPI:  Jeffrey Spence was previously seen in the [place] clinic due to a personal history of colon polyps and concerns regarding a hereditary predisposition to cancer. Please refer to our prior cancer genetics clinic note for more information regarding our discussion, assessment and recommendations, at the time. Jeffrey Spence recent genetic test results were disclosed to him, as were recommendations warranted by these results. These results and recommendations are discussed in more detail below.  Jeffrey Spence is a 58 y.o. male with no personal history of cancer. In 2014 he had a colonoscopy that revealed at least 2 tubular adenomas. A 2016 colonoscopy revealed 17 polyps, the majority were adenomatous polyps with one hyperplastic polyps. A 2017 colonoscopy revealed 6 polyps, mostly tubular adenomas and a sessile serrated. His 2020 colonoscopy revealed 5 tubular adenomas. Cumulatively, Jeffrey Spence has had at least 30 colon polyps, with approximately 28 of those being tubular adenomas.   FAMILY HISTORY:  We obtained a detailed, 4-generation family history.  Significant diagnoses are listed below: Family History  Problem Relation Age of Onset   Heart attack Father 70   Cancer Father        blood   Rheum arthritis Mother    Leukemia Paternal Uncle    Cancer Daughter        Vulvar (d. 68)   Colon cancer Neg Hx    Jeffrey Spence has 7 children, 3 daughters and 4 sons. One of his daughters passed away recently from vulvar cancer at the age of 88. He does not have brothers/sisters.  Jeffrey Spence mother is living at 4. She had two brothers and two sisters, none have had cancer. No cancer in the patient's maternal cousins. His maternal grandparents both passed away in their late 46s.  Jeffrey Spence father died at 69. He had MDS. He had a brother and a sister. Jeffrey Spence uncle died of leukemia in his 23s. No cancers in his paternal cousins. His paternal  grandfather died at 20, grandmother died in her 35s.  Jeffrey Spence is unaware of previous family history of genetic testing for hereditary cancer risks. Patient's maternal ancestors are of American Indian/English descent, and paternal ancestors are of [place] descent. There is no reported Ashkenazi Jewish ancestry. There is no known consanguinity.  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  The test report has been scanned into EPIC and is located under the Molecular Pathology section of the Results Review tab.  A portion of the result report is included below for reference.     Genetic testing did identify a variant of uncertain significance (VUS) in the BUB1B gene called c.1032G>A.  At this time, it is unknown if this variant is associated with increased cancer risk or if this is a normal finding, but most variants such as this get reclassified to being inconsequential. It should not be used to make medical management decisions. With time, we suspect the lab will determine the significance of this variant, if any. If we do learn more about it, we will try to contact Jeffrey Spence to discuss it further. However, it is important to stay in touch with [place] periodically and keep the address and phone number up to date.  CHEK2  CHEK2 mutations have been found to be associated with an increased risk of breast and other cancers. The estimated cancer  risks vary widely and may be influenced by family history. Women with a CHEK2 deleterious mutation have approximately a 24% (no family history of breast cancer) to 48% (strong family history of breast cancer) lifetime risk of breast cancer and up to a 25% risk of a second breast cancer. Men may have an increased risk for male breast cancer of about 1%. Men and women may have an increased  risk of colon cancer (~10% lifetime risk). There are no established management guidelines for individuals with disease causing mutations in CHEK2. However, management options based on other genes associated with high risks of breast cancer (such as the BRCA1 and BRCA2) may be appropriate to follow.   CHEK2 pathogenic variants are associated with an increased risk of breast, colon, and other cancers. The estimated cancer risks vary widely and may be influenced by family history.    Breast Cancer: Women with a CHEK2 deleterious mutation have approximately a 24% (no family history of breast cancer) to 48% (strong family history of breast cancer) lifetime risk of breast cancer and up to a 25% risk of a second breast cancer. Men may have a somewhat increased risk for male breast cancer.  This risk is not nearly as high for men as it is for women, but is not well defined.    According to the NCCN guidelines, women with CHEK2 mutations should consider breast MRI's as a part of regular breast cancer screening, and depending on family history could consider a risk-reducing mastectomy. For women, breast cancer screening should begin at age 3 or 29 years younger than the earliest age at breast cancer diagnosis in the family.  Additional publications underscore the appropriateness of breast MRIs and consideration of chemoprevention (tamoxifen) due to the greater than 20% lifetime risk of breast cancer associated with the 1100delC variant (PMID: 83382505, 39767341).    Because there are no specific recommendations regarding breast screening for men with CHEK2 mutations, we often refer to the BRCA screening recommendations as general guidelines on how to screen for this cancer risks.     1. Male Breast self-exam training and education starting at age 49.  2. Clinical breast exam, every 6-12 months, starting at age 67 years   Baseline mammogram can be considered, but there are no guidelines regarding this  screening and the benefits are not well studied.    Colon Cancer: Men and women with CHEK2 mutations have an increased risk of colon cancer (~10% lifetime risk).    Colonoscopy screening every 5 years beginning at age 65 (or more often based on polyp findings/ GI physician recommendations).   If an individual has a first-degree relative with colorectal cancer, screening should begin 10 years prior to the relative's age at diagnosis if before 27.   If an individual has a personal history of colorectal cancer, screening recommendations should be based on recommendations for post-colorectal cancer resection.   Prostate Cancer: The risk of prostate cancer in men with a CHEK2 mutation is estimated to be increased up to 30%.   However, the benefits of screening for prostate cancer among men with a pathogenic variant in CHEK2 are uncertain (PMID: 93790240).      Men may consider prostate cancer screening starting at age 71 years.  (BRCA prostate screening NCCN recommendations)  It has been suggested that men with a CHEK2 pathogenic variant and a first-degree relative with prostate cancer have an annual prostate-specific antigen (PSA) analysis (PMID: 97353299).    Other Cancer Risks: CHEK2 mutations may be  associated with other malignancies as well such as thyroid, melanoma, bladder, kidney.  The rates vary from family to family and more research needs to be done to figure out specific risks.    Ovarian and uterine cancers have been reported in [place] families but it is currently unknown whether there is an associated increased ovarian or uterine cancer risk.  FAMILY MEMBERS: It is important that all of Jeffrey Spence relatives (both men and women) know of the presence of this gene mutation.  Genetic testing can sort out who in your family is at risk and who is not.    Jeffrey Spence children are at 50% risk to have inherited the mutation.. We recommend they have genetic testing for this same mutation,  as identifying the presence of this mutation would allow them to also take advantage of risk-reducing measures.    PLAN:  1. These results will be shared with Jeffrey Spence PCP, Dr. Alphonsa Overall, as well as his GI provider, Dr. Fuller Plan. He would like these physicians to follow him for this indication. 2. Jeffrey Spence will let his family members know about this result. He knows we are happy to help coordinate testing for his family members.   SUPPORT AND RESOURCES: If Jeffrey Spence is interested in BRCA-specific information and support, there are two groups, Facing Our Risk (www.facingourrisk.com) and Bright Pink (www.brightpink.org) which some people have found useful. They provide opportunities to speak with other individuals from high-risk families. To locate genetic counselors in other cities, visit the website of the [employer] (ArtistMovie.se) and Secretary/administrator for a Social worker by zip code.  We encouraged Jeffrey Spence to remain in contact with [place] on an annual basis so we can update his personal and family histories, and let him know of advances in cancer genetics that may benefit the family. Our contact number was provided. Jeffrey Spence questions were answered to his satisfaction today, and he knows he is welcome to call anytime with additional questions.   Faith Rogue, MS Genetic [email or web address] [email or web address] Phone: [phone]

## 2018-09-05 NOTE — Telephone Encounter (Signed)
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 We discussed CHEK2 cancers, management, and the importance of letting family members know about this result. He will call if we can be of assistance with helping family members get tested. We will forward this information to his doctors and send him a copy of his results and a summary letter in the mail. He knows to call with any questions or concerns.

## 2018-09-05 NOTE — Telephone Encounter (Signed)
Patient came in today and had lab work.

## 2018-09-06 LAB — LIPID PANEL
Chol/HDL Ratio: 3.2 ratio (ref 0.0–5.0)
Cholesterol, Total: 133 mg/dL (ref 100–199)
HDL: 42 mg/dL (ref >39)
LDL Calculated: 77 mg/dL (ref 0–99)
Triglycerides: 68 mg/dL (ref 0–149)
VLDL Cholesterol Cal: 14 mg/dL (ref 5–40)

## 2018-09-06 LAB — CBC WITH DIFFERENTIAL/PLATELET
Basophils Absolute: 0 10*3/uL (ref 0.0–0.2)
Basos: 1 %
EOS (ABSOLUTE): 0.1 10*3/uL (ref 0.0–0.4)
Eos: 3 %
Hematocrit: 44.6 % (ref 37.5–51.0)
Hemoglobin: 15 g/dL (ref 13.0–17.7)
Immature Grans (Abs): 0 10*3/uL (ref 0.0–0.1)
Immature Granulocytes: 0 %
Lymphocytes Absolute: 1.8 10*3/uL (ref 0.7–3.1)
Lymphs: 33 %
MCH: 30.3 pg (ref 26.6–33.0)
MCHC: 33.6 g/dL (ref 31.5–35.7)
MCV: 90 fL (ref 79–97)
Monocytes Absolute: 0.6 10*3/uL (ref 0.1–0.9)
Monocytes: 11 %
Neutrophils Absolute: 2.9 10*3/uL (ref 1.4–7.0)
Neutrophils: 52 %
Platelets: 309 10*3/uL (ref 150–450)
RBC: 4.95 x10E6/uL (ref 4.14–5.80)
RDW: 13.1 % (ref 11.6–15.4)
WBC: 5.4 10*3/uL (ref 3.4–10.8)

## 2018-09-06 LAB — HEPATIC FUNCTION PANEL
ALT: 23 IU/L (ref 0–44)
AST: 22 IU/L (ref 0–40)
Albumin: 4.2 g/dL (ref 3.8–4.9)
Alkaline Phosphatase: 136 IU/L — ABNORMAL HIGH (ref 39–117)
Bilirubin Total: <0.2 mg/dL (ref 0.0–1.2)
Bilirubin, Direct: 0.07 mg/dL (ref 0.00–0.40)
Total Protein: 6.4 g/dL (ref 6.0–8.5)

## 2018-09-06 LAB — VITAMIN B12: Vitamin B-12: 1096 pg/mL (ref 232–1245)

## 2018-09-06 LAB — TESTOSTERONE: Testosterone: 202 ng/dL — ABNORMAL LOW (ref 264–916)

## 2018-09-06 LAB — VITAMIN D 25 HYDROXY (VIT D DEFICIENCY, FRACTURES): Vit D, 25-Hydroxy: 23.7 ng/mL — ABNORMAL LOW (ref 30.0–100.0)

## 2018-09-06 LAB — FERRITIN: Ferritin: 109 ng/mL (ref 30–400)

## 2018-09-06 LAB — TSH: TSH: 1.96 u[IU]/mL (ref 0.450–4.500)

## 2018-09-06 NOTE — Progress Notes (Incomplete)
Cardiology Office Note  Date:  09/06/2018   ID:  Jeffrey Spence, DOB 11/06/1960, MRN [id number]  PCP:  Lucille Passy, MD   No chief complaint on file.   HPI:  58 year old gentleman with long history of  smoking,   stopped after bypass surgery coronary artery disease,  bypass surgery 07/25/2015 after non-STEMI,  postoperative atrial fibrillation  who presents for routine follow-up of his CAD, CABG  INTERVAL HISTORY: The patient reports today for follow up.  ***  Blood pressure ***/*** Total Chol 133/ LDL 77 CR 0.77 Glucose 126   EKG personally reviewed by myself on todays visit Shows *** rhythm. *** bpm. ***   {no complaints, no symptoms concerning for angina  on  Crestor  He denies having any myalgias Working at a [place] [employer] for [employer] Previously working with aluminum cylinders Denies any chest pain or shortness of breath concerning for angina No lightheadedness or dizziness  Recent lab work reviewed with him Total cholesterol 143, LDL 77 Dated August 09, 2017  EKG on today's visit shows normal sinus rhythm with rate 75 bpm, nonspecific ST abnormality}  OTHER PAST MEDICAL HISTORY REVIEWED BY ME FOR TODAY'S VISIT:  Presented with non-ST elevation MI  troponin was 1.5, non-ST elevation MI.  underwent coronary bypass grafting 3 with bilateral mammaries and a left radial artery on 07/25/2015. Postoperatively he had atrial fibrillation. He converted to sinus rhythm with amiodarone. He did not require anticoagulation.   Cardiac catheterization report 80-90% ostial left main disease that did not improve with nitroglycerin IC 80% proximal LAD disease, long region with moderate calcification 80-90% proximal RCA disease, calcified, ulcerative plaque 70% proximal left circumflex disease Essentially normal LV gram, ejection fraction greater than 55 %   PMH:   has a past medical history of Arthritis, Blood transfusion without reported diagnosis  (2017), CAD (coronary artery disease), Family history of leukemia, HLD (hyperlipidemia), NSTEMI (non-ST elevated myocardial infarction) ([place]) (06/2015), Personal history of colonic polyps, Postoperative atrial fibrillation ([place]) (07/01/2017), and Tobacco abuse.  PSH:    Past Surgical History:  Procedure Laterality Date   CARDIAC CATHETERIZATION Bilateral 07/23/2015   Procedure: Left Heart Cath and Coronary Angiography;  Surgeon: Minna Merritts, MD;  Location: [facility];  Service: Cardiovascular;  Laterality: Bilateral;   CORONARY ARTERY BYPASS GRAFT N/A 07/25/2015   Procedure: CORONARY ARTERY BYPASS GRAFTING X 3 UTILIZING BILATERAL IMA AND LEFT RADIAL ARTERY;  Surgeon: Melrose Nakayama, MD;  Location: [facility];  Service: Open Heart Surgery;  Laterality: N/A;   fractured toe Right 2013   great toe/with pin   RADIAL ARTERY HARVEST Left 07/25/2015   Procedure: RADIAL ARTERY HARVEST;  Surgeon: Melrose Nakayama, MD;  Location: [facility];  Service: Open Heart Surgery;  Laterality: Left;   TEE WITHOUT CARDIOVERSION N/A 07/25/2015   Procedure: TRANSESOPHAGEAL ECHOCARDIOGRAM (TEE);  Surgeon: Melrose Nakayama, MD;  Location: [facility];  Service: Open Heart Surgery;  Laterality: N/A;    Current Outpatient Medications  Medication Sig Dispense Refill   aspirin 81 MG tablet Take 1 tablet (81 mg total) by mouth daily.     Cyanocobalamin (B-12 PO) Take by mouth.     fluticasone (FLONASE) 50 MCG/ACT nasal spray Place 2 sprays into both nostrils daily. In each nostril 16 g 12   levocetirizine (XYZAL) 5 MG tablet Take 1 tablet (5 mg total) by mouth every evening. 30 tablet 3   metoprolol tartrate (LOPRESSOR) 25 MG tablet Take 0.5 tablets (12.5  mg total) by mouth 2 (two) times daily. 90 tablet 3   Multiple Vitamins-Minerals (MULTIVITAMIN WITH MINERALS) tablet Take 1 tablet by mouth daily.     rivaroxaban (XARELTO) 2.5 MG TABS tablet Take 1 tablet (2.5 mg total) by mouth 2 (two) times daily. 180  tablet 3   rosuvastatin (CRESTOR) 20 MG tablet TAKE 1 TABLET(20 MG) BY MOUTH DAILY 90 tablet 3   No current facility-administered medications for this visit.      Allergies:   Patient has no known allergies.   Social History:  The patient  reports that he quit smoking about 3 years ago. His smoking use included cigarettes. He has a 30.00 pack-year smoking history. He has never used smokeless tobacco. He reports current alcohol use. He reports that he does not use drugs.   Family History:   family history includes Cancer in his daughter and father; Heart attack (age of onset: 69) in his father; Leukemia in his paternal uncle; Rheum arthritis in his mother.    Review of Systems: Review of Systems  Constitutional: Negative.   Eyes: Negative.   Respiratory: Negative.   Cardiovascular: Negative.   Gastrointestinal: Negative.   Genitourinary: Negative.   Musculoskeletal: Negative.   Neurological: Negative.   Psychiatric/Behavioral: Negative.   All other systems reviewed and are negative.    PHYSICAL EXAM: VS:  There were no vitals taken for this visit. , BMI There is no height or weight on file to calculate BMI. Constitutional:  oriented to person, place, and time. No distress.  HENT:  Head: Grossly normal Eyes:  no discharge. No scleral icterus.  Neck: No JVD, no carotid bruits  Cardiovascular: Regular rate and rhythm, no murmurs appreciated *** Pulmonary/Chest: Clear to auscultation bilaterally, no wheezes or rales Abdominal: Soft.  no distension.  no tenderness.  Musculoskeletal: Normal range of motion Neurological:  normal muscle tone. Coordination normal. No atrophy Skin: Skin warm and dry Psychiatric: normal affect, pleasant    Recent Labs: 02/26/2018: BUN 18; Creatinine, Ser 0.77; Potassium 3.7; Sodium 140 09/05/2018: ALT 23; Hemoglobin 15.0; Platelets 309; TSH 1.960    Lipid Panel Lab Results  Component Value Date   CHOL 133 09/05/2018   HDL 42 09/05/2018    LDLCALC 77 09/05/2018   TRIG 68 09/05/2018      Wt Readings from Last 3 Encounters:  07/12/18 180 lb (81.6 kg)  06/02/18 180 lb (81.6 kg)  05/02/18 180 lb (81.6 kg)       ASSESSMENT AND PLAN:  NSTEMI (non-ST elevated myocardial infarction) ([place]) -  Denies any symptoms concerning for angina Continue aspirin,beta blocker,  Crestor 20 mg daily Recommend he stop his Plavix and we will start Xarelto 2.5 twice daily with low-dose aspirin.  Long discussion concerning benefit of changing to the Xarelto  Coronary artery disease involving left main coronary artery  Currently with no symptoms of angina. No further workup at this time. Continue current medication regimen with changes as detailed above  Hypercholesterolemia  Crestor 20 mg daily Cholesterol is at goal on the current lipid regimen. No changes to the medications were made.  TOBACCO ABUSE Quit smoking 2 years ago   Total encounter time more than *** minutes  Greater than 50% was spent in counseling and coordination of care with the patient   Disposition:   F/U  *** months   No orders of the defined types were placed in this encounter.  I, Jesus Reyes am acting as a Education administrator for Ida Rogue, M.D., Ph.D.  {  Add scribe attestation statement}   Signed, Esmond Plants, M.D., Ph.D. 09/06/2018  [place], [place]

## 2018-09-07 ENCOUNTER — Telehealth: Payer: Self-pay

## 2018-09-07 NOTE — Telephone Encounter (Signed)
Call to patient to reschedule in regards to COVID-19 restrictions.    LMOM 

## 2018-09-07 NOTE — Telephone Encounter (Signed)
Call to patient to reschedule in regards to COVID-19 restrictions.  ?  Pt is comfortable with rescheduling at this time and denies any new, urgent or worsening sx.    COVID-19 Pre-Screening:  1. Have you been in contact with someone who was sick? no 2. Do you have any of the following symptoms (cough, fever, muscle pain, vomiting, diarrhea, weakness abdominal pain, rash, red eye, bruising or bleeding, joint pain, severe headache)?  No  3. Have you travelled internationally or out of state in the last month?  no 4. Do you need any refills at this time?  no  Patient aware of the following: Please be advised that we require, no one but yourself to come to appointment. If necessary, only one visitor may come with you into the building. They will also be asked the same screening questions. You will be contacted at a later time to reschedule. However, this will depend on ongoing evaluation of the Covid-19 situation.  Please call us if any new questions or concerns arise. We are here for advice.  Routing to COVID cancel pool.    Reviewed lab results while I had him on the phone. No further questions or orders at this time.

## 2018-09-07 NOTE — Telephone Encounter (Signed)
Patient returning call.

## 2018-09-08 ENCOUNTER — Encounter: Payer: Self-pay | Admitting: Family Medicine

## 2018-09-08 ENCOUNTER — Ambulatory Visit: Payer: Self-pay | Admitting: Cardiovascular Disease

## 2018-09-08 DIAGNOSIS — J329 Chronic sinusitis, unspecified: Secondary | ICD-10-CM | POA: Diagnosis not present

## 2018-09-08 DIAGNOSIS — J309 Allergic rhinitis, unspecified: Secondary | ICD-10-CM | POA: Diagnosis not present

## 2018-09-08 DIAGNOSIS — J3489 Other specified disorders of nose and nasal sinuses: Secondary | ICD-10-CM | POA: Diagnosis not present

## 2018-09-12 LAB — TESTOSTERONE, % FREE: Testosterone-% Free: 3.2 %

## 2018-09-12 LAB — SPECIMEN STATUS REPORT

## 2018-09-12 LAB — SEX HORMONE BINDING GLOBULIN: Sex Hormone Binding: 17.5 nmol/L — ABNORMAL LOW (ref 19.3–76.4)

## 2018-09-12 LAB — TESTOSTERONE, FREE: Testosterone, Free: 6.4 pg/mL — ABNORMAL LOW (ref 7.2–24.0)

## 2018-09-14 ENCOUNTER — Encounter: Payer: Self-pay | Admitting: Family Medicine

## 2018-09-14 ENCOUNTER — Telehealth (INDEPENDENT_AMBULATORY_CARE_PROVIDER_SITE_OTHER): Payer: BLUE CROSS/BLUE SHIELD | Admitting: Cardiovascular Disease

## 2018-09-14 ENCOUNTER — Ambulatory Visit (INDEPENDENT_AMBULATORY_CARE_PROVIDER_SITE_OTHER): Payer: BLUE CROSS/BLUE SHIELD | Admitting: Family Medicine

## 2018-09-14 ENCOUNTER — Telehealth: Payer: Self-pay | Admitting: Cardiovascular Disease

## 2018-09-14 ENCOUNTER — Other Ambulatory Visit: Payer: Self-pay

## 2018-09-14 VITALS — Ht 67.75 in | Wt 177.0 lb

## 2018-09-14 DIAGNOSIS — G4700 Insomnia, unspecified: Secondary | ICD-10-CM | POA: Diagnosis not present

## 2018-09-14 DIAGNOSIS — I25110 Atherosclerotic heart disease of native coronary artery with unstable angina pectoris: Secondary | ICD-10-CM | POA: Diagnosis not present

## 2018-09-14 DIAGNOSIS — R5383 Other fatigue: Secondary | ICD-10-CM

## 2018-09-14 DIAGNOSIS — R7989 Other specified abnormal findings of blood chemistry: Secondary | ICD-10-CM | POA: Diagnosis not present

## 2018-09-14 DIAGNOSIS — I214 Non-ST elevation (NSTEMI) myocardial infarction: Secondary | ICD-10-CM

## 2018-09-14 DIAGNOSIS — E782 Mixed hyperlipidemia: Secondary | ICD-10-CM

## 2018-09-14 DIAGNOSIS — Z951 Presence of aortocoronary bypass graft: Secondary | ICD-10-CM

## 2018-09-14 NOTE — Progress Notes (Signed)
TELEPHONE ENCOUNTER   Patient verbally agreed to telephone visit and is aware that copayment and coinsurance may apply. Patient was treated using telemedicine according to accepted telemedicine protocols.  Location of the patient: home  Location of provider: [place] of all persons participating in the telemedicine service and role in the encounter: Arnette Norris, MD Vilma Meckel, Wenda Overland  Subjective:      HPI   Patient has been feeling run down and fatigued for approximately six months.  He has been working nights now and is not sure if that is part of what is going on.  He doesn't think that it is because he has been working nights for years before this started.  He called Dr. Rockey Situ ( h/o CAD s/o CABG x 3) who added non cardiac fatigue labs and pt would like to discuss these today.  He is having intermittent difficulty falling or staying asleep.  Does not always feel well rested even when he does sleep.  Denies feeling depressed.  No CP, SOB or dizziness with exertion.  Does snore but always has.  Not sure if he has apneic episodes.  Colonoscopy 06/2018- 3 year surveillance due to polyps. Depression screen [facility] 2/9 09/14/2018 03/03/2018 10/09/2015 08/15/2015  Decreased Interest 0 0 0 0  Down, Depressed, Hopeless 0 0 1 1  PHQ - 2 Score 0 0 1 1  Altered sleeping - - 3 3  Tired, decreased energy - - 0 1  Change in appetite - - 3 1  Feeling bad or failure about yourself  - - 0 0  Trouble concentrating - - 0 0  Moving slowly or fidgety/restless - - 0 0  Suicidal thoughts - - 0 0  PHQ-9 Score - - 7 6  Difficult doing work/chores - - Not difficult at all Somewhat difficult     Recent Results (from the past 2160 hour(s))  VITAMIN D 25 Hydroxy (Vit-D Deficiency, Fractures)     Status: Abnormal   Collection Time: 09/05/18  9:20 AM  Result Value Ref Range   Vit D, 25-Hydroxy 23.7 (L) 30.0 - 100.0 ng/mL    Comment: Vitamin D deficiency has been defined by  the Institute of Medicine and an Endocrine Society practice guideline as a level of serum 25-OH vitamin D less than 20 ng/mL (1,2). The Endocrine Society went on to further define vitamin D insufficiency as a level between 21 and 29 ng/mL (2). 1. IOM (Institute of Medicine). 2010. Dietary reference    intakes for calcium and D. [place]: The    [employer]. 2. Holick MF, Binkley [place], Bischoff-Ferrari HA, et al.    Evaluation, treatment, and prevention of vitamin D    deficiency: an Endocrine Society clinical practice    guideline. JCEM. 2011 Jul; 96(7):1911-30.   Vitamin B12     Status: None   Collection Time: 09/05/18  9:20 AM  Result Value Ref Range   Vitamin B-12 1,096 232 - 1,245 pg/mL  CBC w/Diff     Status: None   Collection Time: 09/05/18  9:20 AM  Result Value Ref Range   WBC 5.4 3.4 - 10.8 x10E3/uL   RBC 4.95 4.14 - 5.80 x10E6/uL   Hemoglobin 15.0 13.0 - 17.7 g/dL   Hematocrit 44.6 37.5 - 51.0 %   MCV 90 79 - 97 fL   MCH 30.3 26.6 - 33.0 pg   MCHC 33.6 31.5 - 35.7 g/dL   RDW 13.1 11.6 - 15.4 %  Platelets 309 150 - 450 x10E3/uL   Neutrophils 52 Not Estab. %   Lymphs 33 Not Estab. %   Monocytes 11 Not Estab. %   Eos 3 Not Estab. %   Basos 1 Not Estab. %   Neutrophils Absolute 2.9 1.4 - 7.0 x10E3/uL   Lymphocytes Absolute 1.8 0.7 - 3.1 x10E3/uL   Monocytes Absolute 0.6 0.1 - 0.9 x10E3/uL   EOS (ABSOLUTE) 0.1 0.0 - 0.4 x10E3/uL   Basophils Absolute 0.0 0.0 - 0.2 x10E3/uL   Immature Granulocytes 0 Not Estab. %   Immature Grans (Abs) 0.0 0.0 - 0.1 x10E3/uL  Ferritin     Status: None   Collection Time: 09/05/18  9:20 AM  Result Value Ref Range   Ferritin 109 30 - 400 ng/mL  TSH     Status: None   Collection Time: 09/05/18  9:20 AM  Result Value Ref Range   TSH 1.960 0.450 - 4.500 uIU/mL  Lipid panel     Status: None   Collection Time: 09/05/18  9:20 AM  Result Value Ref Range   Cholesterol, Total 133 100 - 199 mg/dL   Triglycerides 68 0 - 149  mg/dL   HDL 42 >39 mg/dL   VLDL Cholesterol Cal 14 5 - 40 mg/dL   LDL Calculated 77 0 - 99 mg/dL   Chol/HDL Ratio 3.2 0.0 - 5.0 ratio    Comment:                                   T. Chol/HDL Ratio                                             Men  Women                               1/2 Avg.Risk  3.4    3.3                                   Avg.Risk  5.0    4.4                                2X Avg.Risk  9.6    7.1                                3X Avg.Risk 23.4   11.0   Hepatic function panel     Status: Abnormal   Collection Time: 09/05/18  9:20 AM  Result Value Ref Range   Total Protein 6.4 6.0 - 8.5 g/dL   Albumin 4.2 3.8 - 4.9 g/dL   Bilirubin Total <0.2 0.0 - 1.2 mg/dL   Bilirubin, Direct 0.07 0.00 - 0.40 mg/dL   Alkaline Phosphatase 136 (H) 39 - 117 IU/L   AST 22 0 - 40 IU/L   ALT 23 0 - 44 IU/L  Testosterone     Status: Abnormal   Collection Time: 09/05/18  9:49 AM  Result Value Ref Range   Testosterone 202 (L) 264 - 916 ng/dL    Comment: Adult male  reference interval is based on a population of healthy nonobese males (BMI <30) between 1 and 40 years old. [place], [place] [phone]. PMID: [id number].   Testosterone, % free     Status: None   Collection Time: 09/05/18  9:49 AM  Result Value Ref Range   Testosterone-% Free 3.2 %    Comment: Percent free testosterone by equilibrium dialysis performed at [facility] laboratories. [employer] expected values applicable for percent free testosterone only. Reference Range: Adult Males: 1.5 - 3.2   Testosterone, free     Status: Abnormal   Collection Time: 09/05/18  9:49 AM  Result Value Ref Range   Testosterone, Free 6.4 (L) 7.2 - 24.0 pg/mL  Sex hormone binding globulin     Status: Abnormal   Collection Time: 09/05/18  9:49 AM  Result Value Ref Range   Sex Hormone Binding 17.5 (L) 19.3 - 76.4 nmol/L  Specimen status report     Status: None   Collection Time: 09/05/18  9:49 AM  Result Value Ref Range    specimen status report Comment     Comment: Written Authorization Written Authorization Written Authorization Received. Authorization received from [place] 09-06-2018 Logged by Carles Collet    Lab Results  Component Value Date   PSA 0.89 11/15/2012   PSA 1.16 08/12/2010     Patient Active Problem List   Diagnosis Date Noted  . Fatigue 09/14/2018  . Insomnia 09/14/2018  . Genetic testing 09/05/2018  . Family history of leukemia   . Personal history of colonic polyps   . Allergic rhinitis 03/03/2018  . Postoperative atrial fibrillation ([place]) 07/01/2017  . Hyperlipidemia 10/07/2015  . Coronary artery disease involving native coronary artery of native heart with unstable angina pectoris ([place])   . S/P CABG x 3 07/25/2015  . NSTEMI (non-ST elevated myocardial infarction) ([place])   . Acute coronary syndrome ([place])   . Unstable angina pectoris ([place])   . Smoker   . Coronary artery disease involving left main coronary artery   . Swelling of arm 01/20/2013  . OA (osteoarthritis) of knee 01/12/2013  . Low testosterone in male 08/14/2010  . TOBACCO ABUSE 08/12/2010   Social History   Tobacco Use  . Smoking status: Former Smoker    Packs/day: 1.00    Years: 30.00    Pack years: 30.00    Types: Cigarettes    Last attempt to quit: 07/17/2015    Years since quitting: 3.1  . Smokeless tobacco: Never Used  Substance Use Topics  . Alcohol use: Yes    Alcohol/week: 0.0 standard drinks    Comment: rare    Current Outpatient Medications:  .  aspirin 81 MG tablet, Take 1 tablet (81 mg total) by mouth daily., Disp: , Rfl:  .  cetirizine (ZYRTEC) 10 MG tablet, Take 10 mg by mouth daily as needed., Disp: , Rfl:  .  Cyanocobalamin (B-12 PO), Take by mouth., Disp: , Rfl:  .  fluticasone (FLONASE) 50 MCG/ACT nasal spray, Place 2 sprays into both nostrils daily. In each nostril, Disp: 16 g, Rfl: 12 .  metoprolol tartrate (LOPRESSOR) 25 MG tablet, Take 0.5 tablets (12.5 mg total) by  mouth 2 (two) times daily., Disp: 90 tablet, Rfl: 3 .  Multiple Vitamins-Minerals (MULTIVITAMIN WITH MINERALS) tablet, Take 1 tablet by mouth daily., Disp: , Rfl:  .  rivaroxaban (XARELTO) 2.5 MG TABS tablet, Take 1 tablet (2.5 mg total) by mouth 2 (two) times daily., Disp: 180 tablet, Rfl: 3 .  rosuvastatin (CRESTOR) 20  MG tablet, TAKE 1 TABLET(20 MG) BY MOUTH DAILY, Disp: 90 tablet, Rfl: 3 No Known Allergies  Review of Systems  Constitutional: Positive for fatigue.  HENT: Negative.   Eyes: Negative.   Respiratory: Negative.   Cardiovascular: Negative.   Gastrointestinal: Negative.   Endocrine: Negative.   Genitourinary: Negative.   Musculoskeletal: Negative.   Allergic/Immunologic: Negative.   Neurological: Negative.   Psychiatric/Behavioral: Positive for sleep disturbance. Negative for agitation, behavioral problems, confusion, decreased concentration, dysphoric mood, hallucinations, self-injury and suicidal ideas. The patient is not nervous/anxious and is not hyperactive.    Ht 5' 7.75" (1.721 m)   Wt 177 lb (80.3 kg)   BMI 27.11 kg/m   Assessment & Plan:   1. Low testosterone in male   2. Other fatigue   3. Insomnia, unspecified type   4. NSTEMI (non-ST elevated myocardial infarction) ([place])     No orders of the defined types were placed in this encounter.  No orders of the defined types were placed in this encounter.   Arnette Norris, MD 09/14/2018  Time spent with the patient: 20 minutes, spent in obtaining information about his symptoms, reviewing his previous labs, evaluations, and treatments, counseling him about his condition (please see the discussed topics above), and developing a plan to further investigate it; he had a number of questions which I addressed.   [id number] physician/qualified health professional telephone evaluation 5 to 10 minutes [phone] physician/qualified help functional [place] evaluation for 11 to 20 minutes [id number] physician/qualify he will  professional telephone evaluation for 21 to 30 minutes

## 2018-09-14 NOTE — Assessment & Plan Note (Addendum)
  Testosterone MIGHT improve libido, mood, strength, and lean body mass. But it's not likely to solve erectile dysfunction...because that's usually a vascular problem.   On the other hand, testosterone can lead to gynecomastia, edema, and polycythemia (thickening of your blood). It MIGHT also increase cardiac risk...and worsen sleep apnea, severe BPH (enlargement of prostate), and prostate cancer.   Save testosterone for men with significant symptoms.Marland KitchenMarland KitchenAND a low MORNING testosterone level on TWO separate occasions.

## 2018-09-14 NOTE — Patient Instructions (Signed)
Great to talk with you. Please talk with Dr. Rockey Situ and get back to me.

## 2018-09-14 NOTE — Patient Instructions (Signed)

## 2018-09-14 NOTE — Assessment & Plan Note (Signed)
Followed by Dr. Rockey Situ.  He will call Dr. Rockey Situ about the cardiac risks of starting supplemental testosterone.  Pt will send me a message after he has talked with me.

## 2018-09-14 NOTE — Telephone Encounter (Signed)
Pt states his PCP wants to start him on testosterone injections and would like to discuss this with Dr. Rockey Situ.

## 2018-09-14 NOTE — Telephone Encounter (Signed)
Spoke with patient and reviewed that we have virtual visits that we can offer for him to review his questions with provider. He states his PCP wanted him to start on testosterone and he needed to discuss this with cardiology before they start. He was agreeable with visit and put him on the schedule for today. Sent him consent via Mychart with instructions to reply once he reviews. Reviewed all medications with him and confirmed appointment time for today. He was appreciative for the call and had no further questions at this time.

## 2018-09-14 NOTE — Progress Notes (Addendum)
Virtual Visit via Video Note   This visit type was conducted due to national recommendations for restrictions regarding the COVID-19 Pandemic (e.g. social distancing) in an effort to limit this patient's exposure and mitigate transmission in our community.  Due to her co-morbid illnesses, this patient is at least at moderate risk for complications without adequate follow up.  This format is felt to be most appropriate for this patient at this time.  All issues noted in this document were discussed and addressed.  A limited physical exam was performed with this format.  Please refer to the patient's chart for her consent to telehealth for [facility]    Date:  09/14/2018   ID:  Jeffrey Spence, DOB [birth year], MRN [id number]  Patient Location:  [address] [place] [address]   Provider location:   [facility], [place] office  PCP:  Lucille Passy, MD  Cardiologist:  No primary care provider on file.   Chief Complaint:  No energy, fatigue    History of Present Illness:    Jeffrey Spence is a 58 y.o. male who presents via audio/video conferencing for a telehealth visit today.   The patient does not symptoms concerning for COVID-19 infection (fever, chills, cough, or new SHORTNESS OF BREATH).  Please refer to prior office visit for complete details: Patient has a past medical history of  smoking,  stopped after bypass surgery coronary artery disease,  bypass surgery 07/25/2015 after non-STEMI,  postoperative atrial fibrillation  who presents for routine follow-up of his CAD, CABG  Working at a [place] [employer] for [employer] Previously working with aluminum cylinders Denies any chest pain or shortness of breath concerning for angina No lightheadedness or dizziness  Free testosterone level 6.4 Testosterone 202  last checked 8 years ago 214  Total cholesterol 133, LDL 77 HDL 42 TSH 1.9 Elevated alkaline phosphatase, normal AST ALT  In general  reports having fatigue, feels like he could sleep for longer, no energy to get up and do anything He discussed this with primary care, has low vitamin D and low testosterone He is going to try to buy some over-the-counter vitamin D supplement He would like to start testosterone supplement as he does not like the way he feels,  Cautious, wanted to make sure heart would be okay  Denies any chest pain concerning for angina Working long hours, 12-hour shifts Not much time to do regular exercise program but denies significant shortness of breath Compliant with his medications, no side effects   Prior CV studies:   The following studies were reviewed today:  Cardiac catheterization report 80-90% ostial left main disease that did not improve with nitroglycerin IC 80% proximal LAD disease, long region with moderate calcification 80-90% proximal RCA disease, calcified, ulcerative plaque 70% proximal left circumflex disease Essentially normal LV gram, ejection fraction greater than 55 %  underwent coronary bypass grafting 3 with bilateral mammaries and a left radial artery on 07/25/2015. Postoperatively he had atrial fibrillation. He converted to sinus rhythm with amiodarone. He did not require anticoagulation.    Past Medical History:  Diagnosis Date  . Arthritis   . Blood transfusion without reported diagnosis 2017   triple by pass  . CAD (coronary artery disease)    a. nstemi 1.2017; b. cardiac cath 06/2015 with LM and severe 3 veseel dz (full report pending)  . Family history of leukemia   . HLD (hyperlipidemia)    pt. denies  . NSTEMI (  non-ST elevated myocardial infarction) ([place]) 06/2015  . Personal history of colonic polyps   . Postoperative atrial fibrillation ([place]) 07/01/2017  . Tobacco abuse    Past Surgical History:  Procedure Laterality Date  . CARDIAC CATHETERIZATION Bilateral 07/23/2015   Procedure: Left Heart Cath and Coronary Angiography;  Surgeon: Minna Merritts, MD;   Location: [facility];  Service: Cardiovascular;  Laterality: Bilateral;  . CORONARY ARTERY BYPASS GRAFT N/A 07/25/2015   Procedure: CORONARY ARTERY BYPASS GRAFTING X 3 UTILIZING BILATERAL IMA AND LEFT RADIAL ARTERY;  Surgeon: Melrose Nakayama, MD;  Location: [facility];  Service: Open Heart Surgery;  Laterality: N/A;  . fractured toe Right 2013   great toe/with pin  . RADIAL ARTERY HARVEST Left 07/25/2015   Procedure: RADIAL ARTERY HARVEST;  Surgeon: Melrose Nakayama, MD;  Location: [facility];  Service: Open Heart Surgery;  Laterality: Left;  . TEE WITHOUT CARDIOVERSION N/A 07/25/2015   Procedure: TRANSESOPHAGEAL ECHOCARDIOGRAM (TEE);  Surgeon: Melrose Nakayama, MD;  Location: [facility] [facility];  Service: Open Heart Surgery;  Laterality: N/A;     No outpatient medications have been marked as taking for the 09/14/18 encounter (Appointment) with Minna Merritts, MD.     Allergies:   Patient has no known allergies.   Social History   Tobacco Use  . Smoking status: Former Smoker    Packs/day: 1.00    Years: 30.00    Pack years: 30.00    Types: Cigarettes    Last attempt to quit: 07/17/2015    Years since quitting: 3.1  . Smokeless tobacco: Never Used  Substance Use Topics  . Alcohol use: Yes    Alcohol/week: 0.0 standard drinks    Comment: rare  . Drug use: No     Family Hx: The patient's family history includes Cancer in his daughter and father; Heart attack (age of onset: 71) in his father; Leukemia in his paternal uncle; Rheum arthritis in his mother. There is no history of Colon cancer.  ROS:   Please see the history of present illness.    Review of Systems  Constitutional: Positive for malaise/fatigue.  Respiratory: Negative.   Cardiovascular: Negative.   Gastrointestinal: Negative.   Musculoskeletal: Negative.   Neurological: Negative.   Psychiatric/Behavioral: Negative.   All other systems reviewed and are negative.     Labs/Other Tests and Data Reviewed:    Recent  Labs: 02/26/2018: BUN 18; Creatinine, Ser 0.77; Potassium 3.7; Sodium 140 09/05/2018: ALT 23; Hemoglobin 15.0; Platelets 309; TSH 1.960   Recent Lipid Panel Lab Results  Component Value Date/Time   CHOL 133 09/05/2018 09:20 AM   TRIG 68 09/05/2018 09:20 AM   HDL 42 09/05/2018 09:20 AM   CHOLHDL 3.2 09/05/2018 09:20 AM   CHOLHDL 3.5 08/09/2017 09:58 AM   LDLCALC 77 09/05/2018 09:20 AM   LDLDIRECT 140.9 11/15/2012 08:39 AM    Wt Readings from Last 3 Encounters:  09/14/18 177 lb (80.3 kg)  07/12/18 180 lb (81.6 kg)  06/02/18 180 lb (81.6 kg)     Exam:    Vital Signs:  There were no vitals taken for this visit.   Well nourished, well developed male in no acute distress. Well-appearing, no distress, No abnormalities noted visually over WebCam Breathing comfortably  ASSESSMENT & PLAN:    Coronary artery disease involving native coronary artery of native heart with unstable angina pectoris (HCC) Currently with no symptoms of angina. No further workup at this time. Continue current medication regimen. No changes made  to his medications  S/P CABG x 3 No further testing needed  Mixed hyperlipidemia Lipids at goal, reviewed with him in detail  Fatigue, unspecified type Long discussion concerning causes of fatigue He is working long hours but he is concerned about low vitamin D and low testosterone and would like to treat these Discussed risk and benefit of the testosterone supplement Given his symptoms, benefit with outweigh the risk Suggested he should go ahead and treat his low testosterone Also discussed methods to treat his below vitamin D Will defer to primary care whether he needs prescription He will discuss methods to treat testosterone  with primary care We did discuss shots, patch, creams Given viral outbreak, potentially could start testosterone patch with transition to shots at a later date with urology   COVID-19 Education: The signs and symptoms of COVID-19  were discussed with the patient and how to seek care for testing (follow up with PCP or arrange E-visit).  The importance of social distancing was discussed today.  Patient Risk:   After full review of this patients clinical status, I feel that they are at least moderate risk at this time.  Time:   Today, I have spent 25 minutes with the patient with telehealth technology discussing low testosterone, effect on the heart, effective heart with supplement, low vitamin D, anginal symptoms, lipid management.     Medication Adjustments/Labs and Tests Ordered: Current medicines are reviewed at length with the patient today.  Concerns regarding medicines are outlined above.   Tests Ordered: No tests ordered   Medication Changes: No changes made   Disposition: Follow-up in 6 months   Signed, Ida Rogue, MD  09/14/2018 3:20 PM    [facility] [address] [place] [address], [place], [place] [address]

## 2018-09-14 NOTE — Assessment & Plan Note (Signed)
Discussed lab results with patient and answered questioned.

## 2018-09-15 ENCOUNTER — Encounter: Payer: Self-pay | Admitting: Family Medicine

## 2018-09-15 ENCOUNTER — Other Ambulatory Visit: Payer: Self-pay | Admitting: Family Medicine

## 2018-09-15 DIAGNOSIS — R7989 Other specified abnormal findings of blood chemistry: Secondary | ICD-10-CM

## 2018-09-16 NOTE — Telephone Encounter (Signed)
Patient already seen.

## 2018-10-06 ENCOUNTER — Encounter: Payer: Self-pay | Admitting: Family Medicine

## 2018-10-24 ENCOUNTER — Encounter: Payer: Self-pay | Admitting: Family Medicine

## 2018-10-24 ENCOUNTER — Ambulatory Visit (INDEPENDENT_AMBULATORY_CARE_PROVIDER_SITE_OTHER): Payer: BLUE CROSS/BLUE SHIELD | Admitting: Family Medicine

## 2018-10-24 DIAGNOSIS — R6889 Other general symptoms and signs: Secondary | ICD-10-CM

## 2018-10-24 DIAGNOSIS — Z20822 Contact with and (suspected) exposure to covid-19: Secondary | ICD-10-CM | POA: Insufficient documentation

## 2018-10-24 NOTE — Assessment & Plan Note (Signed)
Since we cannot rule out COVID 19 and his symptoms are consistent, I did sent him a work note through my chart to give his employer with our recommendations.  Call or send my chart message prn if these symptoms worsen or fail to improve as anticipated. The patient indicates understanding of these issues and agrees with the plan.

## 2018-10-24 NOTE — Progress Notes (Signed)
Virtual Visit via Video   Due to the COVID-19 pandemic, this visit was completed with telemedicine (audio/video) technology to reduce patient and provider exposure as well as to preserve personal protective equipment.   I connected with [name] by a video enabled telemedicine application and verified that I am speaking with the correct person using two identifiers. Location patient: Home Location provider: [facility], Office Persons participating in the virtual visit: Festus Aloe, MD   I discussed the limitations of evaluation and management by telemedicine and the availability of in person appointments. The patient expressed understanding and agreed to proceed.  Care Team   Patient Care Team: Lucille Passy, MD as PCP - General (Family Medicine) Rockey Situ Kathlene November, MD as Consulting Physician (Cardiology)  Subjective:   HPI:   Fever- He has had a fever with Tmax 100.8 that started on 5.1.20. Denies any cough. Has post-nasal-drip present. Denies any throat pain, SOB, or throat pain. He does have body aches and feels more tired than normal.  Fever seems to come on nightly with body aches.  He does go into work every other week.  Review of Systems  Constitutional: Positive for fever and malaise/fatigue.  HENT: Positive for congestion. Negative for ear discharge, ear pain, hearing loss, sore throat and tinnitus.   Eyes: Negative.   Respiratory: Negative for cough, hemoptysis, sputum production, shortness of breath, wheezing and stridor.   Cardiovascular: Negative.   Gastrointestinal: Negative.   Genitourinary: Negative.   Musculoskeletal: Positive for myalgias. Negative for back pain, joint pain and neck pain.  All other systems reviewed and are negative.    Patient Active Problem List   Diagnosis Date Noted  . Fatigue 09/14/2018  . Insomnia 09/14/2018  . Genetic testing 09/05/2018  . Family history of leukemia   . Personal history of colonic polyps    . Allergic rhinitis 03/03/2018  . Postoperative atrial fibrillation ([place]) 07/01/2017  . Hyperlipidemia 10/07/2015  . Coronary artery disease involving native coronary artery of native heart with unstable angina pectoris ([place])   . S/P CABG x 3 07/25/2015  . NSTEMI (non-ST elevated myocardial infarction) ([place])   . Acute coronary syndrome ([place])   . Unstable angina pectoris ([place])   . Smoker   . Coronary artery disease involving left main coronary artery   . Swelling of arm 01/20/2013  . OA (osteoarthritis) of knee 01/12/2013  . Low testosterone in male 08/14/2010  . TOBACCO ABUSE 08/12/2010    Social History   Tobacco Use  . Smoking status: Former Smoker    Packs/day: 1.00    Years: 30.00    Pack years: 30.00    Types: Cigarettes    Last attempt to quit: 07/17/2015    Years since quitting: 3.2  . Smokeless tobacco: Never Used  Substance Use Topics  . Alcohol use: Yes    Alcohol/week: 0.0 standard drinks    Comment: rare    Current Outpatient Medications:  .  aspirin 81 MG tablet, Take 1 tablet (81 mg total) by mouth daily., Disp: , Rfl:  .  cetirizine (ZYRTEC) 10 MG tablet, Take 10 mg by mouth daily as needed., Disp: , Rfl:  .  Cholecalciferol (VITAMIN D3) 50 MCG (2000 UT) capsule, Take 2,000 Units by mouth daily., Disp: , Rfl:  .  Cyanocobalamin (B-12 PO), Take by mouth., Disp: , Rfl:  .  fluticasone (FLONASE) 50 MCG/ACT nasal spray, Place 2 sprays into both nostrils daily. In each nostril, Disp: 16  g, Rfl: 12 .  metoprolol tartrate (LOPRESSOR) 25 MG tablet, Take 0.5 tablets (12.5 mg total) by mouth 2 (two) times daily., Disp: 90 tablet, Rfl: 3 .  Multiple Vitamins-Minerals (MULTIVITAMIN WITH MINERALS) tablet, Take 1 tablet by mouth daily., Disp: , Rfl:  .  rivaroxaban (XARELTO) 2.5 MG TABS tablet, Take 1 tablet (2.5 mg total) by mouth 2 (two) times daily., Disp: 180 tablet, Rfl: 3 .  rosuvastatin (CRESTOR) 20 MG tablet, TAKE 1 TABLET(20 MG) BY MOUTH DAILY, Disp: 90 tablet, Rfl:  3  No Known Allergies  Objective:  Temp 100 F (37.8 C) (Oral)   VITALS: Per patient if applicable, see vitals. GENERAL: Alert, appears well and in no acute distress. HEENT: Atraumatic, conjunctiva clear, no obvious abnormalities on inspection of external nose and ears. NECK: Normal movements of the head and neck. CARDIOPULMONARY: No increased WOB. Speaking in clear sentences. I:E ratio WNL.  MS: Moves all visible extremities without noticeable abnormality. PSYCH: Pleasant and cooperative, well-groomed. Speech normal rate and rhythm. Affect is appropriate. Insight and judgement are appropriate. Attention is focused, linear, and appropriate.  NEURO: CN grossly intact. Oriented as arrived to appointment on time with no prompting. Moves both UE equally.  SKIN: No obvious lesions, wounds, erythema, or cyanosis noted on face or hands.  Depression screen [facility] 2/9 09/14/2018 03/03/2018 10/09/2015  Decreased Interest 0 0 0  Down, Depressed, Hopeless 0 0 1  PHQ - 2 Score 0 0 1  Altered sleeping - - 3  Tired, decreased energy - - 0  Change in appetite - - 3  Feeling bad or failure about yourself  - - 0  Trouble concentrating - - 0  Moving slowly or fidgety/restless - - 0  Suicidal thoughts - - 0  PHQ-9 Score - - 7  Difficult doing work/chores - - Not difficult at all    Assessment and Plan:   There are no diagnoses linked to this encounter.  Marland Kitchen COVID-19 Education: The signs and symptoms of COVID-19 were discussed with the patient and how to seek care for testing if needed. The importance of social distancing was discussed today. . Reviewed expectations re: course of current medical issues. . Discussed self-management of symptoms. . Outlined signs and symptoms indicating need for more acute intervention. . Patient verbalized understanding and all questions were answered. Marland Kitchen Health Maintenance issues including appropriate healthy diet, exercise, and smoking avoidance were discussed with  patient. . See orders for this visit as documented in the electronic medical record.  Arnette Norris, MD  Records requested if needed. Time spent: 15 minutes, of which >50% was spent in obtaining information about his symptoms, reviewing his previous labs, evaluations, and treatments, counseling him about his condition (please see the discussed topics above), and developing a plan to further investigate it; he had a number of questions which I addressed.

## 2018-10-27 ENCOUNTER — Ambulatory Visit (INDEPENDENT_AMBULATORY_CARE_PROVIDER_SITE_OTHER): Payer: BLUE CROSS/BLUE SHIELD | Admitting: Family Medicine

## 2018-10-27 ENCOUNTER — Encounter: Payer: Self-pay | Admitting: Family Medicine

## 2018-10-27 VITALS — Temp 100.4°F

## 2018-10-27 DIAGNOSIS — Z20828 Contact with and (suspected) exposure to other viral communicable diseases: Secondary | ICD-10-CM

## 2018-10-27 DIAGNOSIS — M791 Myalgia, unspecified site: Secondary | ICD-10-CM | POA: Diagnosis not present

## 2018-10-27 DIAGNOSIS — R5383 Other fatigue: Secondary | ICD-10-CM

## 2018-10-27 DIAGNOSIS — Z20822 Contact with and (suspected) exposure to covid-19: Secondary | ICD-10-CM

## 2018-10-27 DIAGNOSIS — R6889 Other general symptoms and signs: Principal | ICD-10-CM

## 2018-10-27 DIAGNOSIS — R509 Fever, unspecified: Secondary | ICD-10-CM | POA: Diagnosis not present

## 2018-10-27 NOTE — Progress Notes (Signed)
Virtual Visit via Video   Due to the COVID-19 pandemic, this visit was completed with telemedicine (audio/video) technology to reduce patient and provider exposure as well as to preserve personal protective equipment.   I connected with Jeffrey Spence by a video enabled telemedicine application and verified that I am speaking with the correct person using two identifiers. Location patient: Home Location provider: [facility], Office Persons participating in the virtual visit: Festus Aloe, MD   I discussed the limitations of evaluation and management by telemedicine and the availability of in person appointments. The patient expressed understanding and agreed to proceed.  Care Team   Patient Care Team: Lucille Passy, MD as PCP - General (Family Medicine) Minna Merritts, MD as Consulting Physician (Cardiology)  Subjective:   HPI:   Last saw pt for virtual visit 3 days ago (10/23/18).  Note reviewed.  At that time he complained of a fever with Tmax 100.8 that started on 5.1.20. Denied any cough. Has post-nasal-drip present. Denies any throat pain, SOB, or throat pain. He does have body aches and feels more tired than normal.  Fever seems to come on nightly with body aches.  Since we could not  rule out COVID 19 and his symptoms are consistent, I did sent him a work note through my chart to give his employer with our recommendations.  Today he remains febrile at 100.4. Has post-nasal-drip, fatigue, and body aches present. Has been laying around since Sx started. Denies ear pain, sinus pressure, throat pain, change in taste, nausea, vomiting, diarrhea, sinus pressure, or SOB.  Review of Systems  Constitutional: Positive for malaise/fatigue.  HENT: Positive for congestion. Negative for ear discharge, ear pain, hearing loss, nosebleeds, sinus pain, sore throat and tinnitus.   Respiratory: Negative for shortness of breath and stridor.   Gastrointestinal: Negative for  diarrhea, nausea and vomiting.  Musculoskeletal: Positive for myalgias.  All other systems reviewed and are negative.    Patient Active Problem List   Diagnosis Date Noted  . Suspected Covid-19 Virus Infection 10/24/2018  . Fatigue 09/14/2018  . Insomnia 09/14/2018  . Genetic testing 09/05/2018  . Family history of leukemia   . Personal history of colonic polyps   . Allergic rhinitis 03/03/2018  . Postoperative atrial fibrillation ([place]) 07/01/2017  . Hyperlipidemia 10/07/2015  . Coronary artery disease involving native coronary artery of native heart with unstable angina pectoris ([place])   . S/P CABG x 3 07/25/2015  . NSTEMI (non-ST elevated myocardial infarction) ([place])   . Acute coronary syndrome ([place])   . Unstable angina pectoris ([place])   . Smoker   . Coronary artery disease involving left main coronary artery   . Swelling of arm 01/20/2013  . OA (osteoarthritis) of knee 01/12/2013  . Low testosterone in male 08/14/2010  . TOBACCO ABUSE 08/12/2010    Social History   Tobacco Use  . Smoking status: Former Smoker    Packs/day: 1.00    Years: 30.00    Pack years: 30.00    Types: Cigarettes    Last attempt to quit: 07/17/2015    Years since quitting: 3.2  . Smokeless tobacco: Never Used  Substance Use Topics  . Alcohol use: Yes    Alcohol/week: 0.0 standard drinks    Comment: rare    Current Outpatient Medications:  .  aspirin 81 MG tablet, Take 1 tablet (81 mg total) by mouth daily., Disp: , Rfl:  .  cetirizine (ZYRTEC) 10 MG tablet, Take  10 mg by mouth daily as needed., Disp: , Rfl:  .  Cholecalciferol (VITAMIN D3) 50 MCG (2000 UT) capsule, Take 2,000 Units by mouth daily., Disp: , Rfl:  .  Cyanocobalamin (B-12 PO), Take by mouth., Disp: , Rfl:  .  fluticasone (FLONASE) 50 MCG/ACT nasal spray, Place 2 sprays into both nostrils daily. In each nostril, Disp: 16 g, Rfl: 12 .  metoprolol tartrate (LOPRESSOR) 25 MG tablet, Take 0.5 tablets (12.5 mg total) by mouth 2 (two)  times daily., Disp: 90 tablet, Rfl: 3 .  Multiple Vitamins-Minerals (MULTIVITAMIN WITH MINERALS) tablet, Take 1 tablet by mouth daily., Disp: , Rfl:  .  rivaroxaban (XARELTO) 2.5 MG TABS tablet, Take 1 tablet (2.5 mg total) by mouth 2 (two) times daily., Disp: 180 tablet, Rfl: 3 .  rosuvastatin (CRESTOR) 20 MG tablet, TAKE 1 TABLET(20 MG) BY MOUTH DAILY, Disp: 90 tablet, Rfl: 3  No Known Allergies  Objective:  Temp (!) 100.4 F (38 C) (Oral)   VITALS: Per patient if applicable, see vitals. GENERAL: Alert, appears well and in no acute distress. HEENT: Atraumatic, conjunctiva clear, no obvious abnormalities on inspection of external nose and ears. NECK: Normal movements of the head and neck. CARDIOPULMONARY: No increased WOB. Speaking in clear sentences. I:E ratio WNL.  MS: Moves all visible extremities without noticeable abnormality. PSYCH: Pleasant and cooperative, well-groomed. Speech normal rate and rhythm. Affect is appropriate. Insight and judgement are appropriate. Attention is focused, linear, and appropriate.  NEURO: CN grossly intact. Oriented as arrived to appointment on time with no prompting. Moves both UE equally.  SKIN: No obvious lesions, wounds, erythema, or cyanosis noted on face or hands.  Depression screen [facility] 2/9 09/14/2018 03/03/2018 10/09/2015  Decreased Interest 0 0 0  Down, Depressed, Hopeless 0 0 1  PHQ - 2 Score 0 0 1  Altered sleeping - - 3  Tired, decreased energy - - 0  Change in appetite - - 3  Feeling bad or failure about yourself  - - 0  Trouble concentrating - - 0  Moving slowly or fidgety/restless - - 0  Suicidal thoughts - - 0  PHQ-9 Score - - 7  Difficult doing work/chores - - Not difficult at all    Assessment and Plan:   Jeffrey Spence was seen today for fever.  Diagnoses and all orders for this visit:  Suspected Covid-19 Virus Infection    . COVID-19 Education: The signs and symptoms of COVID-19 were discussed with the patient and how to seek  care for testing if needed. The importance of social distancing was discussed today. . Reviewed expectations re: course of current medical issues. . Discussed self-management of symptoms. . Outlined signs and symptoms indicating need for more acute intervention. . Patient verbalized understanding and all questions were answered. Marland Kitchen Health Maintenance issues including appropriate healthy diet, exercise, and smoking avoidance were discussed with patient. . See orders for this visit as documented in the electronic medical record.  Arnette Norris, MD  Records requested if needed. Time spent: 73minutes, of which >50% was spent in obtaining information about his symptoms, reviewing his previous labs, evaluations, and treatments, counseling him about his condition (please see the discussed topics above), and developing a plan to further investigate it; he had a number of questions which I addressed.

## 2018-10-27 NOTE — Assessment & Plan Note (Signed)
Remains febrile, no worsening or new symptoms- still no cough or shortness of breath. He will continue to self quarantine and monitor symptoms. He is aware that he should go straight to the ED if his symptoms worsen over the weekend. The patient indicates understanding of these issues and agrees with the plan.

## 2018-10-31 ENCOUNTER — Other Ambulatory Visit: Payer: Self-pay | Admitting: Cardiovascular Disease

## 2018-11-18 ENCOUNTER — Other Ambulatory Visit: Payer: Self-pay | Admitting: Cardiovascular Disease

## 2018-11-21 NOTE — Telephone Encounter (Signed)
Please review for refill on xarelto.

## 2018-12-15 DIAGNOSIS — E291 Testicular hypofunction: Secondary | ICD-10-CM | POA: Diagnosis not present

## 2018-12-15 DIAGNOSIS — Z79899 Other long term (current) drug therapy: Secondary | ICD-10-CM | POA: Diagnosis not present

## 2018-12-15 DIAGNOSIS — Z125 Encounter for screening for malignant neoplasm of prostate: Secondary | ICD-10-CM | POA: Diagnosis not present

## 2018-12-15 DIAGNOSIS — N500 Atrophy of testis: Secondary | ICD-10-CM | POA: Diagnosis not present

## 2019-01-29 ENCOUNTER — Other Ambulatory Visit: Payer: Self-pay | Admitting: Cardiovascular Disease

## 2019-02-28 ENCOUNTER — Other Ambulatory Visit: Payer: Self-pay | Admitting: Cardiovascular Disease

## 2019-03-24 ENCOUNTER — Encounter: Payer: Self-pay | Admitting: Family Medicine

## 2019-03-29 ENCOUNTER — Ambulatory Visit: Payer: BC Managed Care – PPO | Admitting: Family Medicine

## 2019-03-29 ENCOUNTER — Other Ambulatory Visit: Payer: Self-pay

## 2019-03-29 ENCOUNTER — Ambulatory Visit (INDEPENDENT_AMBULATORY_CARE_PROVIDER_SITE_OTHER)
Admission: RE | Admit: 2019-03-29 | Discharge: 2019-03-29 | Disposition: A | Discharge status: Home / Self Care | Payer: BC Managed Care – PPO | Source: Ambulatory Visit | Attending: Family Medicine | Admitting: Family Medicine

## 2019-03-29 ENCOUNTER — Encounter: Payer: Self-pay | Admitting: Family Medicine

## 2019-03-29 VITALS — BP 124/80 | HR 75 | Temp 98.4°F | Ht 67.75 in | Wt 183.5 lb

## 2019-03-29 DIAGNOSIS — M1712 Unilateral primary osteoarthritis, left knee: Secondary | ICD-10-CM | POA: Diagnosis not present

## 2019-03-29 DIAGNOSIS — M25462 Effusion, left knee: Secondary | ICD-10-CM | POA: Diagnosis not present

## 2019-03-29 MED ORDER — METHYLPREDNISOLONE ACETATE 40 MG/ML IJ SUSP
80.0000 mg | Freq: Once | INTRAMUSCULAR | Status: AC
  Administered 2019-03-29: 80 mg via INTRA_ARTICULAR

## 2019-03-29 NOTE — Progress Notes (Signed)
Jeffrey Chieffo T. Woodrow Dulski, MD Primary Care and [place] at [facility] [place], [address] Phone: [phone]  FAX: [place] - 58 y.o. male  MRN [id number]  Date of Birth: 1961/01/17  Visit Date: 03/29/2019  PCP: Lucille Passy, MD  Referred by: Lucille Passy, MD  Chief Complaint  Patient presents with  . Knee Pain    Left   Subjective:   Jeffrey Spence is a 58 y.o. very pleasant male patient with Body mass index is 28.11 kg/m. who presents with the following:  L knee pain:  Already has some bad OA.  Will swell and medial hurts all the time.  Ibuprofen.  Voltaren gel. 2012 with some OA and wosening over the last year.  I saw him about 8 years ago, and he has been worsening since that time.  He notices it most the time when he is at work, and he does go up and down stairs a lot occasionally to get down on his knees.  Hurts when standing up. Stiff and cannot move for a while.  Situated and then can move a little bit better Will hurt when on his knees.    Work for [employer].  No recent injuries.  Hurt knee as a child. Muscles injury.    Knee injection, Left  Past Medical History, Surgical History, Social History, Family History, Problem List, Medications, and Allergies have been reviewed and updated if relevant.  Patient Active Problem List   Diagnosis Date Noted  . Suspected COVID-19 virus infection 10/24/2018  . Fatigue 09/14/2018  . Insomnia 09/14/2018  . Genetic testing 09/05/2018  . Family history of leukemia   . Personal history of colonic polyps   . Allergic rhinitis 03/03/2018  . Postoperative atrial fibrillation ([place]) 07/01/2017  . Hyperlipidemia 10/07/2015  . Coronary artery disease involving native coronary artery of native heart with unstable angina pectoris ([place])   . S/P CABG x 3 07/25/2015  . NSTEMI (non-ST elevated myocardial infarction) ([place])   . Acute coronary syndrome ([place])    . Unstable angina pectoris ([place])   . Smoker   . Coronary artery disease involving left main coronary artery   . Swelling of arm 01/20/2013  . OA (osteoarthritis) of knee 01/12/2013  . Low testosterone in male 08/14/2010  . TOBACCO ABUSE 08/12/2010    Past Medical History:  Diagnosis Date  . Arthritis   . Blood transfusion without reported diagnosis 2017   triple by pass  . CAD (coronary artery disease)    a. nstemi 1.2017; b. cardiac cath 06/2015 with LM and severe 3 veseel dz (full report pending)  . Family history of leukemia   . HLD (hyperlipidemia)    pt. denies  . NSTEMI (non-ST elevated myocardial infarction) ([place]) 06/2015  . Personal history of colonic polyps   . Postoperative atrial fibrillation ([place]) 07/01/2017  . Tobacco abuse     Past Surgical History:  Procedure Laterality Date  . CARDIAC CATHETERIZATION Bilateral 07/23/2015   Procedure: Left Heart Cath and Coronary Angiography;  Surgeon: Minna Merritts, MD;  Location: [place] [facility];  Service: Cardiovascular;  Laterality: Bilateral;  . CORONARY ARTERY BYPASS GRAFT N/A 07/25/2015   Procedure: CORONARY ARTERY BYPASS GRAFTING X 3 UTILIZING BILATERAL IMA AND LEFT RADIAL ARTERY;  Surgeon: Melrose Nakayama, MD;  Location: [place];  Service: Open Heart Surgery;  Laterality: N/A;  . fractured toe Right 2013  great toe/with pin  . RADIAL ARTERY HARVEST Left 07/25/2015   Procedure: RADIAL ARTERY HARVEST;  Surgeon: Melrose Nakayama, MD;  Location: [place];  Service: Open Heart Surgery;  Laterality: Left;  . TEE WITHOUT CARDIOVERSION N/A 07/25/2015   Procedure: TRANSESOPHAGEAL ECHOCARDIOGRAM (TEE);  Surgeon: Melrose Nakayama, MD;  Location: [place];  Service: Open Heart Surgery;  Laterality: N/A;    Social History   Socioeconomic History  . Marital status: Married    Spouse name: Not on file  . Number of children: 7  . Years of education: Not on file  . 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 Financial resource strain: Not on file  . Food insecurity    Worry: Not on file    Inability: Not on file  . Transportation needs    Medical: Not on file    Non-medical: Not on file  Tobacco Use  . Smoking status: Former Smoker    Packs/day: 1.00    Years: 30.00    Pack years: 30.00    Types: Cigarettes    Quit date: 07/17/2015    Years since quitting: 3.7  . Smokeless tobacco: Never Used  Substance and Sexual Activity  . Alcohol use: Yes    Alcohol/week: 0.0 standard drinks    Comment: rare  . Drug use: No  . Sexual activity: Not on file  Lifestyle  . Physical activity    Days per week: Not on file    Minutes per session: Not on file  . Stress: Not on file  Relationships  . Social Herbalist on phone: Not on file    Gets together: Not on file    Attends religious service: Not on file    Active member of club or organization: Not on file    Attends meetings of clubs or organizations: Not on file    Relationship status: Not on file  . Intimate partner violence    Fear of current or ex partner: Not on file    Emotionally abused: Not on file    Physically abused: Not on file    Forced sexual activity: Not on file  Other Topics Concern  . Not on file  Social History Narrative   Regular exercise--yes    Family History  Problem Relation Age of Onset  . Heart attack Father 60  . Cancer Father        blood  . Rheum arthritis Mother   . Leukemia Paternal Uncle   . Cancer Daughter        Vulvar (d. 72)  . Colon cancer Neg Hx     No Known Allergies  Medication list reviewed and updated in full in [place].  GEN: No fevers, chills. Nontoxic. Primarily MSK c/o today. MSK: Detailed in the HPI GI: tolerating PO intake without difficulty Neuro: No numbness, parasthesias, or tingling associated. Otherwise the pertinent positives of the ROS are noted above.   Objective:   BP 124/80   Pulse 75    Temp 98.4 F (36.9 C) (Temporal)   Ht 5' 7.75" (1.721 m)   Wt 183 lb 8 oz (83.2 kg)   SpO2 98%   BMI 28.11 kg/m    GEN: WDWN, NAD, Non-toxic, Alert & Oriented x 3 HEENT: Atraumatic, Normocephalic.  Ears and Nose: No external deformity. EXTR: No clubbing/cyanosis/edema NEURO: Normal gait.  PSYCH: Normally interactive. Conversant. Not  depressed or anxious appearing.  Calm demeanor.    Left knee, full extension.  Flexion to 95 degrees.  Moderate effusion.  Stable to varus and valgus stress.  ACL and PCL are intact.  He does have pain with both flexion pinch and McMurray's.  Bowel sounds normal.  Radiology: No results found.   Assessment and Plan:     ICD-10-CM   1. Primary osteoarthritis of left knee  M17.12 DG Knee 4 Views W/Patella Left    methylPREDNISolone acetate (DEPO-MEDROL) injection 80 mg   Plain films do show moderate osteoarthritic changes tricompartmental, worse in the medial compartment.  There is joint space narrowing as well as subchondral sclerosis.  He is already doing a fairly good job with conservative management.  Counseled him to discontinue oral NSAIDs secondary to Xarelto use.  Aspiration/Injection Procedure Note MANIK WESTGATE 1960/07/06 Date of procedure: 03/29/2019  Procedure: Large Joint Aspiration / Injection of Knee, LEFT Indications: Pain  Procedure Details Patient verbally consented to procedure. Risks (including potential rare risk of infection), benefits, and alternatives explained. Sterilely prepped with Chloraprep. Ethyl cholride used for anesthesia. 8 cc Lidocaine 1% mixed with 2 mL Depo-Medrol 40 mg injected using the anteromedial approach without difficulty. No complications with procedure and tolerated well. Patient had decreased pain post-injection. Medication: 2 mL of Depo-Medrol 40 mg, equaling Depo-Medrol 80 mg total   Follow-up: No follow-ups on file.  Meds ordered this encounter  Medications  . methylPREDNISolone acetate  (DEPO-MEDROL) injection 80 mg   Orders Placed This Encounter  Procedures  . DG Knee 4 Views W/Patella Left    Signed,  Darcee Dekker T. Rona Tomson, MD   Outpatient Encounter Medications as of 03/29/2019  Medication Sig  . aspirin 81 MG tablet Take 1 tablet (81 mg total) by mouth daily.  . cetirizine (ZYRTEC) 10 MG tablet Take 10 mg by mouth daily as needed.  . Cholecalciferol (VITAMIN D3) 50 MCG (2000 UT) capsule Take 2,000 Units by mouth daily.  . Cyanocobalamin (B-12 PO) Take by mouth.  . diclofenac sodium (VOLTAREN) 1 % GEL Apply 2 g topically daily as needed.  . fluticasone (FLONASE) 50 MCG/ACT nasal spray Place 2 sprays into both nostrils daily. In each nostril  . metoprolol tartrate (LOPRESSOR) 25 MG tablet TAKE 1 TABLET(25 MG) BY MOUTH TWICE DAILY  . Multiple Vitamins-Minerals (MULTIVITAMIN WITH MINERALS) tablet Take 1 tablet by mouth daily.  . rosuvastatin (CRESTOR) 20 MG tablet TAKE 1 TABLET(20 MG) BY MOUTH DAILY  . testosterone (ANDRODERM) 4 MG/24HR PT24 patch Place 1 patch onto the skin daily.  Alveda Reasons 2.5 MG TABS tablet TAKE 1 TABLET(2.5 MG) BY MOUTH TWICE DAILY  . [EXPIRED] methylPREDNISolone acetate (DEPO-MEDROL) injection 80 mg    No facility-administered encounter medications on file as of 03/29/2019.

## 2019-06-06 NOTE — Progress Notes (Signed)
Virtual Visit via Video   Due to the COVID-19 pandemic, this visit was completed with telemedicine (audio/video) technology to reduce patient and provider exposure as well as to preserve personal protective equipment.   I connected with [name] by a video enabled telemedicine application and verified that I am speaking with the correct person using two identifiers. Location patient: Home Location provider: [facility], Office Persons participating in the virtual visit: Festus Aloe, MD   I discussed the limitations of evaluation and management by telemedicine and the availability of in person appointments. The patient expressed understanding and agreed to proceed.  Care Team   Patient Care Team: Lucille Passy, MD as PCP - General (Family Medicine) Minna Merritts, MD as Consulting Physician (Cardiology)  Subjective:   HPI:  Lack of energy, feeling tired x1year - improved with testosterone and vit B12 but last 2-3 months it seems worse again.    Had labs done for work 03/2019.  Vitamin B12 deficiency- taking 3000 mcg daily.  2 week ago, started feeling  lightheaded and nausea without vomiting, abdominal pain or change in bowel habits.  Last happened 3 days ago- has only occurred a few times.  Has had hot flashes as well.   Covid test at work last Friday was negative.  CAD- remote h/o CABG.  Sees Dr. Rockey Situ.  Denies CP.   No SOB.   Depression screen [facility] 2/9 09/14/2018 03/03/2018 10/09/2015 08/15/2015  Decreased Interest 0 0 0 0  Down, Depressed, Hopeless 0 0 1 1  PHQ - 2 Score 0 0 1 1  Altered sleeping - - 3 3  Tired, decreased energy - - 0 1  Change in appetite - - 3 1  Feeling bad or failure about yourself  - - 0 0  Trouble concentrating - - 0 0  Moving slowly or fidgety/restless - - 0 0  Suicidal thoughts - - 0 0  PHQ-9 Score - - 7 6  Difficult doing work/chores - - Not difficult at all Somewhat difficult      Review of Systems    Constitutional: Positive for diaphoresis and malaise/fatigue. Negative for fever.  HENT: Negative.  Negative for congestion and hearing loss.   Eyes: Negative for blurred vision, discharge and redness.  Respiratory: Negative.  Negative for cough and shortness of breath.   Cardiovascular: Negative.  Negative for chest pain, palpitations and leg swelling.  Gastrointestinal: Positive for nausea. Negative for abdominal pain, blood in stool, constipation, diarrhea, heartburn, melena and vomiting.  Genitourinary: Negative.  Negative for dysuria.  Musculoskeletal: Negative.  Negative for falls.  Skin: Negative.  Negative for rash.  Neurological: Negative.  Negative for loss of consciousness and headaches.  Endo/Heme/Allergies: Negative.  Does not bruise/bleed easily.  Psychiatric/Behavioral: Negative.  Negative for depression, hallucinations, memory loss, substance abuse and suicidal ideas. The patient is not nervous/anxious and does not have insomnia.      Patient Active Problem List   Diagnosis Date Noted  . Vitamin B12 deficiency 06/07/2019  . Nausea 06/07/2019  . Fatigue 09/14/2018  . Insomnia 09/14/2018  . Genetic testing 09/05/2018  . Family history of leukemia   . Personal history of colonic polyps   . Allergic rhinitis 03/03/2018  . Postoperative atrial fibrillation ([place]) 07/01/2017  . Hyperlipidemia 10/07/2015  . Coronary artery disease involving native coronary artery of native heart with unstable angina pectoris ([place])   . S/P CABG x 3 07/25/2015  . NSTEMI (non-ST elevated myocardial infarction) (  [place])   . Acute coronary syndrome ([place])   . Unstable angina pectoris ([place])   . Smoker   . Coronary artery disease involving left main coronary artery   . Swelling of arm 01/20/2013  . OA (osteoarthritis) of knee 01/12/2013  . Low testosterone in male 08/14/2010  . TOBACCO ABUSE 08/12/2010    Social History   Tobacco Use  . Smoking status: Former Smoker    Packs/day: 1.00     Years: 30.00    Pack years: 30.00    Types: Cigarettes    Quit date: 07/17/2015    Years since quitting: 3.8  . Smokeless tobacco: Never Used  Substance Use Topics  . Alcohol use: Yes    Alcohol/week: 0.0 standard drinks    Comment: rare    Current Outpatient Medications:  .  aspirin 81 MG tablet, Take 1 tablet (81 mg total) by mouth daily., Disp: , Rfl:  .  cetirizine (ZYRTEC) 10 MG tablet, Take 10 mg by mouth daily as needed., Disp: , Rfl:  .  Cholecalciferol (VITAMIN D3) 50 MCG (2000 UT) capsule, Take 2,000 Units by mouth daily., Disp: , Rfl:  .  Cyanocobalamin (B-12 PO), Take by mouth., Disp: , Rfl:  .  diclofenac sodium (VOLTAREN) 1 % GEL, Apply 2 g topically daily as needed., Disp: , Rfl:  .  fluticasone (FLONASE) 50 MCG/ACT nasal spray, Place 2 sprays into both nostrils daily. In each nostril, Disp: 16 g, Rfl: 12 .  metoprolol tartrate (LOPRESSOR) 25 MG tablet, TAKE 1 TABLET(25 MG) BY MOUTH TWICE DAILY, Disp: 180 tablet, Rfl: 2 .  Multiple Vitamins-Minerals (MULTIVITAMIN WITH MINERALS) tablet, Take 1 tablet by mouth daily., Disp: , Rfl:  .  rosuvastatin (CRESTOR) 20 MG tablet, TAKE 1 TABLET(20 MG) BY MOUTH DAILY, Disp: 90 tablet, Rfl: 2 .  testosterone (ANDRODERM) 4 MG/24HR PT24 patch, Place 1 patch onto the skin daily., Disp: , Rfl:  .  XARELTO 2.5 MG TABS tablet, TAKE 1 TABLET(2.5 MG) BY MOUTH TWICE DAILY, Disp: 180 tablet, Rfl: 3  No Known Allergies  Objective:  Ht 5' 7.75" (1.721 m)   Wt 178 lb 9.6 oz (81 kg)   BMI 27.36 kg/m   VITALS: Per patient if applicable, see vitals. GENERAL: Alert, appears well and in no acute distress. HEENT: Atraumatic, conjunctiva clear, no obvious abnormalities on inspection of external nose and ears. NECK: Normal movements of the head and neck. CARDIOPULMONARY: No increased WOB. Speaking in clear sentences. I:E ratio WNL.  MS: Moves all visible extremities without noticeable abnormality. PSYCH: Pleasant and cooperative, well-groomed.  Speech normal rate and rhythm. Affect is appropriate. Insight and judgement are appropriate. Attention is focused, linear, and appropriate.  NEURO: CN grossly intact. Oriented as arrived to appointment on time with no prompting. Moves both UE equally.  SKIN: No obvious lesions, wounds, erythema, or cyanosis noted on face or hands.  Depression screen [facility] 2/9 09/14/2018 03/03/2018 10/09/2015  Decreased Interest 0 0 0  Down, Depressed, Hopeless 0 0 1  PHQ - 2 Score 0 0 1  Altered sleeping - - 3  Tired, decreased energy - - 0  Change in appetite - - 3  Feeling bad or failure about yourself  - - 0  Trouble concentrating - - 0  Moving slowly or fidgety/restless - - 0  Suicidal thoughts - - 0  PHQ-9 Score - - 7  Difficult doing work/chores - - Not difficult at all     . COVID-19 Education: The signs and symptoms  of COVID-19 were discussed with the patient and how to seek care for testing if needed. The importance of social distancing was discussed today. . Reviewed expectations re: course of current medical issues. . Discussed self-management of symptoms. . Outlined signs and symptoms indicating need for more acute intervention. . Patient verbalized understanding and all questions were answered. Marland Kitchen Health Maintenance issues including appropriate healthy diet, exercise, and smoking avoidance were discussed with patient. . See orders for this visit as documented in the electronic medical record.  Arnette Norris, MD  Records requested if needed. Time spent: 25 minutes, of which >50% was spent in obtaining information about his symptoms, reviewing his previous labs, evaluations, and treatments, counseling him about his condition (please see the discussed topics above), and developing a plan to further investigate it; he had a number of questions which I addressed.   Lab Results  Component Value Date   WBC 5.4 09/05/2018   HGB 15.0 09/05/2018   HCT 44.6 09/05/2018   PLT 309 09/05/2018   GLUCOSE 126  (H) 02/26/2018   CHOL 133 09/05/2018   TRIG 68 09/05/2018   HDL 42 09/05/2018   LDLDIRECT 140.9 11/15/2012   LDLCALC 77 09/05/2018   ALT 23 09/05/2018   AST 22 09/05/2018   NA 140 02/26/2018   K 3.7 02/26/2018   CL 109 02/26/2018   CREATININE 0.77 02/26/2018   BUN 18 02/26/2018   CO2 23 02/26/2018   TSH 1.960 09/05/2018   PSA 0.89 11/15/2012   INR 0.96 02/26/2018   HGBA1C 5.5 07/23/2015    Lab Results  Component Value Date   TSH 1.960 09/05/2018   Lab Results  Component Value Date   WBC 5.4 09/05/2018   HGB 15.0 09/05/2018   HCT 44.6 09/05/2018   MCV 90 09/05/2018   PLT 309 09/05/2018   Lab Results  Component Value Date   NA 140 02/26/2018   K 3.7 02/26/2018   CO2 23 02/26/2018   GLUCOSE 126 (H) 02/26/2018   BUN 18 02/26/2018   CREATININE 0.77 02/26/2018   BILITOT <0.2 09/05/2018   ALKPHOS 136 (H) 09/05/2018   AST 22 09/05/2018   ALT 23 09/05/2018   PROT 6.4 09/05/2018   ALBUMIN 4.2 09/05/2018   CALCIUM 8.9 02/26/2018   ANIONGAP 8 02/26/2018   GFR 78.69 08/26/2016   Lab Results  Component Value Date   CHOL 133 09/05/2018   Lab Results  Component Value Date   HDL 42 09/05/2018   Lab Results  Component Value Date   LDLCALC 77 09/05/2018   Lab Results  Component Value Date   TRIG 68 09/05/2018   Lab Results  Component Value Date   CHOLHDL 3.2 09/05/2018   Lab Results  Component Value Date   HGBA1C 5.5 07/23/2015       Assessment & Plan:   Problem List Items Addressed This Visit      Active Problems   Low testosterone in male   Relevant Orders   Testosterone   Testosterone, free   Sex hormone binding globulin   S/P CABG x 3   Relevant Orders   Troponin I   Fatigue - Primary    Likely multifactorial. He does use a testosterone patch, takes vit B12.  I am concerned that it could be cardiac since he did have a couple of episodes with nausea and diaphoresis, so I advised him to call Dr. Rockey Situ today. Will also check labs.  The  patient indicates understanding of these issues and  agrees with the plan.  Orders Placed This Encounter  Procedures  . Vitamin D (25 hydroxy)  . B12  . Iron, TIBC and Ferritin Panel  . Testosterone  . Testosterone, free  . Sex hormone binding globulin  . CBC with Differential/Platelet  . Troponin I  . H. pylori breath test         Relevant Orders   Vitamin D (25 hydroxy)   Iron, TIBC and Ferritin Panel   CBC with Differential/Platelet   Vitamin B12 deficiency   Relevant Orders   B12   Nausea    See above- he will call Dr. Rockey Situ and will also let me know if his symptoms return. Check labs. Orders Placed This Encounter  Procedures  . Vitamin D (25 hydroxy)  . B12  . Iron, TIBC and Ferritin Panel  . Testosterone  . Testosterone, free  . Sex hormone binding globulin  . CBC with Differential/Platelet  . Troponin I  . H. pylori breath test         Relevant Orders   Troponin I   H. pylori breath test      I am having Jondavid L. Newhard maintain his aspirin EC, Cyanocobalamin (B-12 PO), multivitamin with minerals, fluticasone, cetirizine, Vitamin D3, Xarelto, metoprolol tartrate, rosuvastatin, Androderm, and diclofenac sodium.  No orders of the defined types were placed in this encounter.    Arnette Norris, MD

## 2019-06-07 ENCOUNTER — Encounter: Payer: Self-pay | Admitting: Family Medicine

## 2019-06-07 ENCOUNTER — Telehealth: Payer: Self-pay | Admitting: Cardiovascular Disease

## 2019-06-07 ENCOUNTER — Telehealth (INDEPENDENT_AMBULATORY_CARE_PROVIDER_SITE_OTHER): Payer: BC Managed Care – PPO | Admitting: Family Medicine

## 2019-06-07 VITALS — Ht 67.75 in | Wt 178.6 lb

## 2019-06-07 DIAGNOSIS — E538 Deficiency of other specified B group vitamins: Secondary | ICD-10-CM | POA: Diagnosis not present

## 2019-06-07 DIAGNOSIS — R7989 Other specified abnormal findings of blood chemistry: Secondary | ICD-10-CM | POA: Diagnosis not present

## 2019-06-07 DIAGNOSIS — R5383 Other fatigue: Secondary | ICD-10-CM | POA: Diagnosis not present

## 2019-06-07 DIAGNOSIS — Z951 Presence of aortocoronary bypass graft: Secondary | ICD-10-CM

## 2019-06-07 DIAGNOSIS — R110 Nausea: Secondary | ICD-10-CM | POA: Diagnosis not present

## 2019-06-07 NOTE — Assessment & Plan Note (Signed)
See above- he will call Dr. Rockey Situ and will also let me know if his symptoms return. Check labs. Orders Placed This Encounter  Procedures  . Vitamin D (25 hydroxy)  . B12  . Iron, TIBC and Ferritin Panel  . Testosterone  . Testosterone, free  . Sex hormone binding globulin  . CBC with Differential/Platelet  . Troponin I  . H. pylori breath test

## 2019-06-07 NOTE — Telephone Encounter (Signed)
Patient calling in after having appointment with PCP (Dr. Deborra Medina). Patients PCP recommended patient to call in with symptoms to make Dr. Rockey Situ aware and ask for his recomendation.  Patients symptoms are: Over last 2 weeks patients broke out in cold sweat, feeling nauseous (never got sick), tiredness (nothing seems to help), occassionally lightheaded

## 2019-06-07 NOTE — Assessment & Plan Note (Signed)
Likely multifactorial. He does use a testosterone patch, takes vit B12.  I am concerned that it could be cardiac since he did have a couple of episodes with nausea and diaphoresis, so I advised him to call Dr. Rockey Situ today. Will also check labs.  The patient indicates understanding of these issues and agrees with the plan.  Orders Placed This Encounter  Procedures  . Vitamin D (25 hydroxy)  . B12  . Iron, TIBC and Ferritin Panel  . Testosterone  . Testosterone, free  . Sex hormone binding globulin  . CBC with Differential/Platelet  . Troponin I  . H. pylori breath test

## 2019-06-07 NOTE — Telephone Encounter (Signed)
Spoke with patient and reviewed signs and symptoms to monitor for that would require immediate evaluation in the ED and confirmed appointment scheduled on Monday. He verbalized understanding with no further questions at this time.

## 2019-06-12 ENCOUNTER — Other Ambulatory Visit: Payer: Self-pay

## 2019-06-12 ENCOUNTER — Encounter: Payer: Self-pay | Admitting: Family

## 2019-06-12 ENCOUNTER — Ambulatory Visit (INDEPENDENT_AMBULATORY_CARE_PROVIDER_SITE_OTHER): Payer: BC Managed Care – PPO | Admitting: Family

## 2019-06-12 VITALS — BP 140/90 | HR 90 | Temp 96.6°F | Ht 68.0 in | Wt 186.0 lb

## 2019-06-12 DIAGNOSIS — R110 Nausea: Secondary | ICD-10-CM

## 2019-06-12 DIAGNOSIS — I25110 Atherosclerotic heart disease of native coronary artery with unstable angina pectoris: Secondary | ICD-10-CM | POA: Diagnosis not present

## 2019-06-12 DIAGNOSIS — E782 Mixed hyperlipidemia: Secondary | ICD-10-CM | POA: Diagnosis not present

## 2019-06-12 DIAGNOSIS — R5383 Other fatigue: Secondary | ICD-10-CM | POA: Diagnosis not present

## 2019-06-12 DIAGNOSIS — R42 Dizziness and giddiness: Secondary | ICD-10-CM

## 2019-06-12 DIAGNOSIS — Z951 Presence of aortocoronary bypass graft: Secondary | ICD-10-CM | POA: Diagnosis not present

## 2019-06-12 NOTE — Patient Instructions (Signed)
Medication Instructions:  No medication changes today.  *If you need a refill on your cardiac medications before your next appointment, please call your pharmacy*  Lab Work: No lab work today. Get labs drawn as discussed with your PCP.  If you have labs (blood work) drawn today and your tests are completely normal, you will receive your results only by: Marland Kitchen MyChart Message (if you have MyChart) OR . A paper copy in the mail If you have any lab test that is abnormal or we need to change your treatment, we will call you to review the results.  Testing/Procedures: You had an EKG today.   Follow-Up: At [facility], you and your health needs are our priority.  As part of our continuing mission to provide you with exceptional heart care, we have created designated Provider Care Teams.  These Care Teams include your primary Cardiologist (physician) and Advanced Practice Providers (APPs -  Physician Assistants and Nurse Practitioners) who all work together to provide you with the care you need, when you need it.  Your next appointment:   4 month(s)  The format for your next appointment:   In Person  Provider:    You may see Ida Rogue, MD or one of the following Advanced Practice Providers on your designated Care Team:    Murray Hodgkins, NP  Christell Faith, PA-C  Marrianne Mood, PA-C   Other Instructions  Try to drink fluids throughout the day. Make sure you are staying adequately hydrated.  Make sure you are eating small regular meals throughout the day.  If you notice that your current episode of this nausea, lightheadedness -keep a symptom diary and note when it happened, what you were doing, and any other details you can think of.  We discussed the possibility of stress testing today.  Your symptoms are very uncommon for heart disease as typical heart related pain occurs with activity.  If you change your mind and would like to have the stress test done please just call  our office and we will get you worked in for an appointment.  You are on good medications to help protect your heart.

## 2019-06-12 NOTE — Progress Notes (Signed)
Office Visit    Patient Name: Jeffrey Spence Date of Encounter: 06/12/2019  Primary Care Provider:  Lucille Passy, MD Primary Cardiologist:  Ida Rogue, MD Electrophysiologist:  None   Chief Complaint    Jeffrey Spence is a 58 y.o. male with a hx of CAD s/p NSTEMI & CABG 2017, remote tobaco abuse, post operative atrial fibrillation. Presents today for episodes of cold sweats, nausea, tiredness, and occasional lightheadedness.   Past Medical History    Past Medical History:  Diagnosis Date  . Arthritis   . Blood transfusion without reported diagnosis 2017   triple by pass  . CAD (coronary artery disease)    a. nstemi 1.2017; b. cardiac cath 06/2015 with LM and severe 3 veseel dz (full report pending)  . Family history of leukemia   . HLD (hyperlipidemia)    pt. denies  . NSTEMI (non-ST elevated myocardial infarction) ([place]) 06/2015  . Personal history of colonic polyps   . Postoperative atrial fibrillation ([place]) 07/01/2017  . Tobacco abuse    Past Surgical History:  Procedure Laterality Date  . CARDIAC CATHETERIZATION Bilateral 07/23/2015   Procedure: Left Heart Cath and Coronary Angiography;  Surgeon: Minna Merritts, MD;  Location: [facility];  Service: Cardiovascular;  Laterality: Bilateral;  . CORONARY ARTERY BYPASS GRAFT N/A 07/25/2015   Procedure: CORONARY ARTERY BYPASS GRAFTING X 3 UTILIZING BILATERAL IMA AND LEFT RADIAL ARTERY;  Surgeon: Melrose Nakayama, MD;  Location: [place];  Service: Open Heart Surgery;  Laterality: N/A;  . fractured toe Right 2013   great toe/with pin  . RADIAL ARTERY HARVEST Left 07/25/2015   Procedure: RADIAL ARTERY HARVEST;  Surgeon: Melrose Nakayama, MD;  Location: [place];  Service: Open Heart Surgery;  Laterality: Left;  . TEE WITHOUT CARDIOVERSION N/A 07/25/2015   Procedure: TRANSESOPHAGEAL ECHOCARDIOGRAM (TEE);  Surgeon: Melrose Nakayama, MD;  Location: [place];  Service: Open Heart Surgery;  Laterality: N/A;     Allergies  No Known Allergies  History of Present Illness    Jeffrey Spence is a 58 y.o. male with a hx of CAD s/p NSTEMI & CABG 2017, post operative atrial fibrilation, remote smoking history last seen 09/14/18 by Dr. Rockey Situ. Seen by his PCP 06/07/19 with reports of episodes of sweating, nausea and recommended to see cardiology. Also noted intermittent lightheadedness.   Previously evaluated for fatigue with noted low vitamin D and low testosterone.   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 He works 7 out of 14 days.   Started 1-2 months ago. Lightheaded, back of neck will be sweaty, lightheaded, feel nauseated. Lasts about 5-10 minutes. At most 3 times per week, sometimes doesn't happen at all.   He endorses eating 2 meals per day.  Does endorse that he does not drink very many fluids throughout the day.  Reports his anginal equivalent prior to CABG was shortness of breath and sensation of his heart racing. He had some tingling in his left hand.  The symptoms have not recurred.  Reports he has trouble sleeping due to his knee pain that wakes him up when he moves in the night. No exercise regimen secondary to L knee pain due to arthritis. Had an injection in 03/29/19 in his knee which lasted 3 weeks.   EKGs/Labs/Other Studies Reviewed:   The following studies were reviewed today:  Cardiac catheterization report 2017 80-90% ostial left main disease that did not improve with nitroglycerin IC  80% proximal LAD disease, long region with moderate calcification 80-90% proximal RCA disease, calcified, ulcerative plaque 70% proximal left circumflex disease Essentially normal LV gram, ejection fraction greater than 55 %   underwent coronary bypass grafting 3 with bilateral mammaries and a left radial artery on 07/25/2015. Postoperatively he had atrial fibrillation. He converted to sinus rhythm with amiodarone. He did not require anticoagulation.    EKG:  EKG is  ordered today.  The ekg ordered today demonstrates sinus rhythm 90 bpm with nonspecific ST abnormality-no acute ST/T wave changes.  Recent Labs: 09/05/2018: ALT 23; Hemoglobin 15.0; Platelets 309; TSH 1.960  Recent Lipid Panel    Component Value Date/Time   CHOL 133 09/05/2018 0920   TRIG 68 09/05/2018 0920   HDL 42 09/05/2018 0920   CHOLHDL 3.2 09/05/2018 0920   CHOLHDL 3.5 08/09/2017 0958   VLDL 25 08/09/2017 0958   LDLCALC 77 09/05/2018 0920   LDLDIRECT 140.9 11/15/2012 0839    Home Medications   Current Meds  Medication Sig  . aspirin 81 MG tablet Take 1 tablet (81 mg total) by mouth daily.  . cetirizine (ZYRTEC) 10 MG tablet Take 10 mg by mouth daily as needed.  . Cholecalciferol (VITAMIN D3) 50 MCG (2000 UT) capsule Take 2,000 Units by mouth daily.  . Cyanocobalamin (B-12 PO) Take by mouth.  . diclofenac sodium (VOLTAREN) 1 % GEL Apply 2 g topically daily as needed.  . fluticasone (FLONASE) 50 MCG/ACT nasal spray Place 2 sprays into both nostrils daily. In each nostril  . metoprolol tartrate (LOPRESSOR) 25 MG tablet TAKE 1 TABLET(25 MG) BY MOUTH TWICE DAILY  . Multiple Vitamins-Minerals (MULTIVITAMIN WITH MINERALS) tablet Take 1 tablet by mouth daily.  . rosuvastatin (CRESTOR) 20 MG tablet TAKE 1 TABLET(20 MG) BY MOUTH DAILY  . testosterone (ANDRODERM) 4 MG/24HR PT24 patch Place 1 patch onto the skin daily.  Alveda Reasons 2.5 MG TABS tablet TAKE 1 TABLET(2.5 MG) BY MOUTH TWICE DAILY    Review of Systems    Review of Systems  Constitution: Positive for malaise/fatigue. Negative for chills and fever.  Cardiovascular: Negative for chest pain, dyspnea on exertion, leg swelling, near-syncope, orthopnea, palpitations and syncope.  Respiratory: Negative for cough, shortness of breath and wheezing.   Musculoskeletal: Positive for joint pain.  Gastrointestinal: Positive for nausea. Negative for vomiting.  Neurological: Positive for light-headedness. Negative for dizziness and  weakness.   All other systems reviewed and are otherwise negative except as noted above.  Physical Exam    VS:  BP 140/90 (BP Location: Left Arm, Patient Position: Sitting, Cuff Size: Normal)   Pulse 90   Temp (!) 96.6 F (35.9 C)   Ht 5\' 8"  (1.727 m)   Wt 186 lb (84.4 kg)   SpO2 99%   BMI 28.28 kg/m  , BMI Body mass index is 28.28 kg/m. GEN: Well nourished, well developed, in no acute distress. HEENT: normal. Neck: Supple, no JVD, carotid bruits, or masses. Cardiac: RRR, no murmurs, rubs, or gallops. No clubbing, cyanosis, edema.  Radials/DP/PT 2+ and equal bilaterally.  Respiratory:  Respirations regular and unlabored, clear to auscultation bilaterally. GI: Soft, nontender, nondistended, BS + x 4. MS: No deformity or atrophy. Skin: Warm and dry, no rash. Neuro:  Strength and sensation are intact. Psych: Normal affect. Anxious.  Accessory Clinical Findings    ECG personally reviewed by me today -sinus rhythm 90 bpm with nonspecific ST abnormality.  No acute ST/T wave changes.- no acute changes.  Assessment & Plan  1. CAD s/p CABG -  Continue GDMT aspirin, beta-blocker, statin, Xarelto 2.5 mg.  In the setting of his episodes of lightheadedness, nausea, sweating we discussed the possibility of stress test today.  Low suspicion that these are related to recurrent ischemia as his previous anginal equivalent was shortness of breath with exertion which he is not experiencing presently.  EKG today sinus rhythm no acute ST/T wave changes.  After discussion of risk and benefit he politely declined stress testing today.  This plan of care was reviewed and discussed with his primary cardiologist Dr. Rockey Situ in the office.  2. HLD, LDL goal <70 -lipid panel 09/05/2018 total 133, HDL 42, LDL 77, triglycerides 68.  Continue rosuvastatin 20 labs daily.  Recommend dietary changes.  If repeat lab with LDL still greater than 70 consider addition of Zetia 10 mg daily.  3. Fatigue -follows with his  primary care provider.  Likely multifactorial (B12 deficiency, vitamin D deficiency, low testosterone, deconditioning.  He has upcoming labs to check on these values with his primary care provider.  4. Ligheadedness/nausea - Reports episodes of lightheadedness with nausea.  No clear pattern.  Happen intermittently over the last month.  They self resolve. No palpitations, no near-syncope, syncope.  I have encouraged him to stay well-hydrated and eat small regular meals.  His orthostatic vitals on exam today were negative.   Disposition: Follow up in 4 month(s) with Dr. Rockey Situ or APP.    Loel Dubonnet, NP 06/12/2019, 2:57 PM

## 2019-06-13 NOTE — Addendum Note (Signed)
Addended by: Lynnea Ferrier on: 06/13/2019 02:42 PM   Modules accepted: Orders

## 2019-06-14 ENCOUNTER — Other Ambulatory Visit: Payer: Self-pay

## 2019-06-14 ENCOUNTER — Other Ambulatory Visit (INDEPENDENT_AMBULATORY_CARE_PROVIDER_SITE_OTHER): Payer: BC Managed Care – PPO

## 2019-06-14 DIAGNOSIS — R5383 Other fatigue: Secondary | ICD-10-CM

## 2019-06-14 DIAGNOSIS — Z951 Presence of aortocoronary bypass graft: Secondary | ICD-10-CM

## 2019-06-14 DIAGNOSIS — R110 Nausea: Secondary | ICD-10-CM

## 2019-06-14 DIAGNOSIS — E538 Deficiency of other specified B group vitamins: Secondary | ICD-10-CM

## 2019-06-14 DIAGNOSIS — R7989 Other specified abnormal findings of blood chemistry: Secondary | ICD-10-CM | POA: Diagnosis not present

## 2019-06-14 LAB — CBC WITH DIFFERENTIAL/PLATELET
Basophils Absolute: 0.1 10*3/uL (ref 0.0–0.1)
Basophils Relative: 0.8 % (ref 0.0–3.0)
Eosinophils Absolute: 0.2 10*3/uL (ref 0.0–0.7)
Eosinophils Relative: 2.1 % (ref 0.0–5.0)
HCT: 39.3 % (ref 39.0–52.0)
Hemoglobin: 13.2 g/dL (ref 13.0–17.0)
Lymphocytes Relative: 26.4 % (ref 12.0–46.0)
Lymphs Abs: 2.1 10*3/uL (ref 0.7–4.0)
MCHC: 33.6 g/dL (ref 30.0–36.0)
MCV: 91.4 fl (ref 78.0–100.0)
Monocytes Absolute: 0.8 10*3/uL (ref 0.1–1.0)
Monocytes Relative: 10.2 % (ref 3.0–12.0)
Neutro Abs: 4.9 10*3/uL (ref 1.4–7.7)
Neutrophils Relative %: 60.5 % (ref 43.0–77.0)
Platelets: 273 10*3/uL (ref 150.0–400.0)
RBC: 4.3 Mil/uL (ref 4.22–5.81)
RDW: 13.5 % (ref 11.5–15.5)
WBC: 8.1 10*3/uL (ref 4.0–10.5)

## 2019-06-14 LAB — TROPONIN I (HIGH SENSITIVITY): High Sens Troponin I: 6 ng/L (ref 2–17)

## 2019-06-14 LAB — VITAMIN B12: Vitamin B-12: >1500 pg/mL — ABNORMAL HIGH (ref 211–911)

## 2019-06-14 LAB — VITAMIN D 25 HYDROXY (VIT D DEFICIENCY, FRACTURES): VITD: 36.85 ng/mL (ref 30.00–100.00)

## 2019-06-14 NOTE — Addendum Note (Signed)
Addended by: Lynnea Ferrier on: 06/14/2019 10:08 AM   Modules accepted: Orders

## 2019-06-15 ENCOUNTER — Other Ambulatory Visit (INDEPENDENT_AMBULATORY_CARE_PROVIDER_SITE_OTHER): Payer: BC Managed Care – PPO

## 2019-06-15 DIAGNOSIS — R5383 Other fatigue: Secondary | ICD-10-CM

## 2019-06-15 DIAGNOSIS — R7989 Other specified abnormal findings of blood chemistry: Secondary | ICD-10-CM

## 2019-06-15 DIAGNOSIS — R110 Nausea: Secondary | ICD-10-CM

## 2019-06-15 DIAGNOSIS — E538 Deficiency of other specified B group vitamins: Secondary | ICD-10-CM

## 2019-06-15 LAB — IRON,TIBC AND FERRITIN PANEL
%SAT: 17 % (calc) — ABNORMAL LOW (ref 20–48)
Ferritin: 77 ng/mL (ref 38–380)
Iron: 57 ug/dL (ref 50–180)
TIBC: 338 mcg/dL (calc) (ref 250–425)

## 2019-06-15 LAB — TESTOSTERONE TOTAL,FREE,BIO, MALES
Albumin: 4.3 g/dL (ref 3.6–5.1)
Sex Hormone Binding: 17 nmol/L — ABNORMAL LOW (ref 22–77)
Testosterone, Bioavailable: 115.9 ng/dL (ref >=110.0)
Testosterone, Free: 58.8 pg/mL (ref 46.0–224.0)
Testosterone: 282 ng/dL (ref 250–827)

## 2019-06-15 LAB — TESTOSTERONE: Testosterone: 190.04 ng/dL — ABNORMAL LOW (ref 300.00–890.00)

## 2019-06-20 LAB — SEX HORMONE BINDING GLOBULIN

## 2019-06-20 LAB — IRON,TIBC AND FERRITIN PANEL

## 2019-06-20 LAB — TESTOSTERONE, FREE

## 2019-06-20 LAB — H. PYLORI BREATH TEST: H. pylori Breath Test: NOT DETECTED

## 2019-07-04 NOTE — Progress Notes (Signed)
Please see other encounter.

## 2019-07-18 NOTE — Progress Notes (Signed)
Renae Mottley T. Kyra Laffey, MD Primary Care and [place] at [facility] [place], [address] Phone: [phone]  FAX: [place] - 59 y.o. male  MRN [id number]  Date of Birth: 01/28/1961  Visit Date: 07/19/2019  PCP: Lucille Passy, MD  Referred by: Lucille Passy, MD  Chief Complaint  Patient presents with  . Knee Pain    left    This visit occurred during the SARS-CoV-2 public health emergency.  Safety protocols were in place, including screening questions prior to the visit, additional usage of staff PPE, and extensive cleaning of exam room while observing appropriate contact time as indicated for disinfecting solutions.   Subjective:   Jeffrey Spence is a 59 y.o. very pleasant male patient with Body mass index is 28.49 kg/m. who presents with the following:  I previously saw the patient in October 2020 with some ongoing pain and arthritis of his left knee.  At that point I did know that he had some moderate tricompartmental osteoarthritis and we did a injection with some corticosteroid at that point.  He is here today to follow-up on his knee pain and arthritis.  He is still having some continued problems and restriction of motion in his left-sided knee.  He has not had any kind of additional injury, but he is having pain underneath the kneecap and just proximal to this in the quad tendon.  He also has some pain that he describes as deep and posterior.  Unfortunately, his knee injection only lasted about 2 weeks.  He is on Xarelto, and he is already now taking ibuprofen 400 mg 3 times a day He also has tried some topical Voltaren gel, this is not really helped at all.  Posterior also will hurt as well + epleys  Voltaren gel.   OTC meds Safe nsaids, h-2 blockers  Past Medical History, Surgical History, Social History, Family History, Problem List, Medications, and Allergies have been reviewed and  updated if relevant.   GEN: No fevers, chills. Nontoxic. Primarily MSK c/o today. MSK: Detailed in the HPI GI: tolerating PO intake without difficulty Neuro: No numbness, parasthesias, or tingling associated. Otherwise the pertinent positives of the ROS are noted above.   Objective:   BP 118/78   Pulse 74   Temp 98.3 F (36.8 C) (Skin)   Ht 5' 7.75" (1.721 m)   Wt 186 lb (84.4 kg)   SpO2 98%   BMI 28.49 kg/m    GEN: WDWN, NAD, Non-toxic, Alert & Oriented x 3 HEENT: Atraumatic, Normocephalic.  Ears and Nose: No external deformity. EXTR: No clubbing/cyanosis/edema NEURO: Normal gait.  PSYCH: Normally interactive. Conversant. Not depressed or anxious appearing.  Calm demeanor.    Minimal effusion.  There are 95 degrees of motion in flexion.  This compares to 125 on the right.  Stable to varus and valgus stress.  ACL and PCL are intact.  Proximal to the patella in the quad tendon there is tenderness to palpation.  He does have tenderness at the medial and lateral patellar facets.  Posteriorly at the posterior medial joint line he does have tenderness.  He does have significant tenderness but no mechanical symptoms with McMurray's, lection pinch testing and also he has a positive Epley's grind maneuver.  Radiology: DG Knee 4 Views W/Patella Left  Result Date: 03/29/2019 CLINICAL DATA:  Left knee pain. EXAM: LEFT KNEE - COMPLETE 4+ VIEW COMPARISON:  Radiographs 03/14/2017 FINDINGS:  The joint spaces are fairly well maintained. There is mild medial joint space narrowing bilaterally with weight-bearing. No degenerative spurring or subchondral cystic change. Mild/early degenerative changes at the patellofemoral joint. No fracture or osteochondral lesion. A suprapatellar knee joint effusion is noted. IMPRESSION: 1. Medial compartment joint space narrowing and early patellofemoral joint degenerative changes. 2. No acute bony findings. 3. Small to moderate-sized joint effusion. Electronically  Signed   By: Marijo Sanes M.D.   On: 03/29/2019 17:30   Assessment and Plan:     ICD-10-CM   1. Primary osteoarthritis of left knee  M17.12   2. Chondromalacia, patella, left  M22.42   3. Tendinitis of left quadriceps tendon  M76.892   4. Restriction of joint motion  M25.60   5. Anticoagulant long-term use  Z79.01    Level of Medical Decision-Making in this case is Moderate.   Continue with conservative care for now.  Discontinue ibuprofen and have him start some Celebrex while taking an H2 blocker at the same time.  Alteration of rehab minimal is again to have him start multiple supplements to see if this makes a significant difference  Patient Instructions  OSTEOARTHRITIS:  Work on your motion daily  Stop the ibuprofen I want you to take Pepcid [facility] whenever you take the Celecoxib (Celebrex)  For symptomatic relief:  Tylenol: 2 tablets up to 3-4 times a day  Topical Capzaicin Cream, as needed (wear glove to put on) - THIS IS EXCEPTIONALLY HOT Supplements: Tart cherry juice and Curcumin (Turmeric extract) have good scientific evidence  For flares, corticosteroid injections help. Hyaluronic Acid injections have good success, average relief is 6 months  Glucosamine and Chondroitin often helpful - will take about 3 months to see if you have an effect. If you do, great, keep them up, if none at that point, no need to take in the future.  Omega-3 fish oils may help, 2 grams daily  Ice joints on bad days, 20 min, 2-3 x / day REGULAR EXERCISE: swimming, Yoga, Tai Chi, bicycle (NON-IMPACT activity)   Weight loss will always take stress off of the joints and back    Follow-up: Return in about 6 weeks (around 08/30/2019).  Meds ordered this encounter  Medications  . celecoxib (CELEBREX) 200 MG capsule    Sig: Take 1 capsule (200 mg total) by mouth daily.    Dispense:  30 capsule    Refill:  2   Modified Medications   No medications on file   No orders of the defined types  were placed in this encounter.   Signed,  Maud Deed. Daelen Belvedere, MD   Outpatient Encounter Medications as of 07/19/2019  Medication Sig  . aspirin 81 MG tablet Take 1 tablet (81 mg total) by mouth daily.  . cetirizine (ZYRTEC) 10 MG tablet Take 10 mg by mouth daily as needed.  . Cholecalciferol (VITAMIN D3) 50 MCG (2000 UT) capsule Take 2,000 Units by mouth daily.  . Cyanocobalamin (B-12 PO) Take by mouth.  . diclofenac sodium (VOLTAREN) 1 % GEL Apply 2 g topically daily as needed.  . fluticasone (FLONASE) 50 MCG/ACT nasal spray Place 2 sprays into both nostrils daily. In each nostril  . metoprolol tartrate (LOPRESSOR) 25 MG tablet TAKE 1 TABLET(25 MG) BY MOUTH TWICE DAILY  . Multiple Vitamins-Minerals (MULTIVITAMIN WITH MINERALS) tablet Take 1 tablet by mouth daily.  . rosuvastatin (CRESTOR) 20 MG tablet TAKE 1 TABLET(20 MG) BY MOUTH DAILY  . testosterone (ANDRODERM) 4 MG/24HR PT24 patch Place 1  patch onto the skin daily.  Alveda Reasons 2.5 MG TABS tablet TAKE 1 TABLET(2.5 MG) BY MOUTH TWICE DAILY  . celecoxib (CELEBREX) 200 MG capsule Take 1 capsule (200 mg total) by mouth daily.   No facility-administered encounter medications on file as of 07/19/2019.

## 2019-07-19 ENCOUNTER — Other Ambulatory Visit: Payer: Self-pay

## 2019-07-19 ENCOUNTER — Ambulatory Visit: Payer: BC Managed Care – PPO | Admitting: Family Medicine

## 2019-07-19 ENCOUNTER — Encounter: Payer: Self-pay | Admitting: Family Medicine

## 2019-07-19 VITALS — BP 118/78 | HR 74 | Temp 98.3°F | Ht 67.75 in | Wt 186.0 lb

## 2019-07-19 DIAGNOSIS — M76892 Other specified enthesopathies of left lower limb, excluding foot: Secondary | ICD-10-CM

## 2019-07-19 DIAGNOSIS — M1712 Unilateral primary osteoarthritis, left knee: Secondary | ICD-10-CM

## 2019-07-19 DIAGNOSIS — M2242 Chondromalacia patellae, left knee: Secondary | ICD-10-CM

## 2019-07-19 DIAGNOSIS — M2560 Stiffness of unspecified joint, not elsewhere classified: Secondary | ICD-10-CM

## 2019-07-19 DIAGNOSIS — Z7901 Long term (current) use of anticoagulants: Secondary | ICD-10-CM

## 2019-07-19 MED ORDER — CELECOXIB 200 MG PO CAPS: 200.0000 mg | ORAL_CAPSULE | Freq: Every day | ORAL | 2 refills | Status: DC

## 2019-07-19 NOTE — Patient Instructions (Addendum)
OSTEOARTHRITIS:  Work on your motion daily  Stop the ibuprofen I want you to take Pepcid [facility] whenever you take the Celecoxib (Celebrex)  For symptomatic relief:  Tylenol: 2 tablets up to 3-4 times a day  Topical Capzaicin Cream, as needed (wear glove to put on) - THIS IS EXCEPTIONALLY HOT Supplements: Tart cherry juice and Curcumin (Turmeric extract) have good scientific evidence  For flares, corticosteroid injections help. Hyaluronic Acid injections have good success, average relief is 6 months  Glucosamine and Chondroitin often helpful - will take about 3 months to see if you have an effect. If you do, great, keep them up, if none at that point, no need to take in the future.  Omega-3 fish oils may help, 2 grams daily  Ice joints on bad days, 20 min, 2-3 x / day REGULAR EXERCISE: swimming, Yoga, Tai Chi, bicycle (NON-IMPACT activity)   Weight loss will always take stress off of the joints and back

## 2019-08-01 DIAGNOSIS — E291 Testicular hypofunction: Secondary | ICD-10-CM | POA: Diagnosis not present

## 2019-08-01 DIAGNOSIS — Z79899 Other long term (current) drug therapy: Secondary | ICD-10-CM | POA: Diagnosis not present

## 2019-08-15 DIAGNOSIS — N401 Enlarged prostate with lower urinary tract symptoms: Secondary | ICD-10-CM | POA: Diagnosis not present

## 2019-08-15 DIAGNOSIS — Z79899 Other long term (current) drug therapy: Secondary | ICD-10-CM | POA: Diagnosis not present

## 2019-08-15 DIAGNOSIS — E291 Testicular hypofunction: Secondary | ICD-10-CM | POA: Diagnosis not present

## 2019-09-13 DIAGNOSIS — Z0189 Encounter for other specified special examinations: Secondary | ICD-10-CM | POA: Diagnosis not present

## 2019-09-14 ENCOUNTER — Other Ambulatory Visit: Payer: Self-pay

## 2019-09-14 MED ORDER — METOPROLOL TARTRATE 25 MG PO TABS: 25.0000 mg | ORAL_TABLET | Freq: Two times a day (BID) | ORAL | 2 refills | Status: DC

## 2019-09-17 NOTE — Progress Notes (Signed)
Date:  09/17/2019   ID:  [name], DOB Sep 08, 1960, MRN [id number]  Patient Location:  [address] [place] [address]   Provider location:   [facility], [place] office  PCP:  Lucille Passy, MD  Cardiologist:  Ida Rogue, MD   Chief Complaint  Patient presents with  . office visit    4 month F/U; Meds verbally reviewed with patient.    History of Present Illness:    Jeffrey Spence is a 60 y.o. 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  Denies any chest pain or shortness of breath concerning for angina No lightheadedness or dizziness  Knee arthritis on left Tried cortisone  No regular exercise program  Lab work reviewed Free testosterone level 6.4 Testosterone 190 Total cholesterol 133, LDL 77 HDL 42 TSH 1.9 Elevated alkaline phosphatase, normal AST ALT  EKG personally reviewed by myself on todays visit Shows normal sinus rhythm rate 68 bpm no significant ST-T wave changes    Prior CV studies:   The following studies were reviewed today:  Cardiac catheterization report 80-90% ostial left main disease that did not improve with nitroglycerin IC 80% proximal LAD disease, long region with moderate calcification 80-90% proximal RCA disease, calcified, ulcerative plaque 70% proximal left circumflex disease Essentially normal LV gram, ejection fraction greater than 55 %  underwent coronary bypass grafting 3 with bilateral mammaries and a left radial artery on 07/25/2015. Postoperatively he had atrial fibrillation. He converted to sinus rhythm with amiodarone. He did not require anticoagulation.    Past Medical History:  Diagnosis Date  .  Arthritis   . Blood transfusion without reported diagnosis 2017   triple by pass  . CAD (coronary artery disease)    a. nstemi 1.2017; b. cardiac cath 06/2015 with LM and severe 3 veseel dz (full report pending)  . Family history of leukemia   . HLD (hyperlipidemia)    pt. denies  . NSTEMI (non-ST elevated myocardial infarction) ([place]) 06/2015  . Personal history of colonic polyps   . Postoperative atrial fibrillation ([place]) 07/01/2017  . Tobacco abuse    Past Surgical History:  Procedure Laterality Date  . CARDIAC CATHETERIZATION Bilateral 07/23/2015   Procedure: Left Heart Cath and Coronary Angiography;  Surgeon: Minna Merritts, MD;  Location: [facility];  Service: Cardiovascular;  Laterality: Bilateral;  . CORONARY ARTERY BYPASS GRAFT N/A 07/25/2015   Procedure: CORONARY ARTERY BYPASS GRAFTING X 3 UTILIZING BILATERAL IMA AND LEFT RADIAL ARTERY;  Surgeon: Melrose Nakayama, MD;  Location: [facility];  Service: Open Heart Surgery;  Laterality: N/A;  . fractured toe Right 2013   great toe/with pin  . RADIAL ARTERY HARVEST Left 07/25/2015   Procedure: RADIAL ARTERY HARVEST;  Surgeon: Melrose Nakayama, MD;  Location: [facility];  Service: Open Heart Surgery;  Laterality: Left;  . TEE WITHOUT CARDIOVERSION N/A 07/25/2015   Procedure: TRANSESOPHAGEAL ECHOCARDIOGRAM (TEE);  Surgeon: Melrose Nakayama, MD;  Location: [facility];  Service: Open Heart Surgery;  Laterality: N/A;     Current Outpatient Medications on File Prior to Visit  Medication Sig Dispense Refill  . aspirin 81 MG tablet Take 1 tablet (81 mg total) by mouth  daily.    . celecoxib (CELEBREX) 200 MG capsule Take 1 capsule (200 mg total) by mouth daily. 30 capsule 2  . cetirizine (ZYRTEC) 10 MG tablet Take 10 mg by mouth daily as needed.    . Cholecalciferol (VITAMIN D3) 50 MCG (2000 UT) capsule Take 2,000 Units by mouth daily.    . Cyanocobalamin (B-12 PO) Take by mouth daily.     . diclofenac sodium (VOLTAREN) 1 % GEL Apply 2 g  topically daily as needed.    . famotidine (PEPCID) 10 MG tablet Take 10 mg by mouth daily.    . fluticasone (FLONASE) 50 MCG/ACT nasal spray Place 2 sprays into both nostrils daily. In each nostril 16 g 12  . JATENZO 237 MG CAPS Take 1 capsule by mouth 2 (two) times daily.    . metoprolol tartrate (LOPRESSOR) 25 MG tablet Take 1 tablet (25 mg total) by mouth 2 (two) times daily. 180 tablet 2  . Multiple Vitamins-Minerals (MULTIVITAMIN WITH MINERALS) tablet Take 1 tablet by mouth daily.    Keene Breath COQ10/UBIQUINOL/MEGA PO Take 1,000 mg by mouth daily.    . rosuvastatin (CRESTOR) 20 MG tablet TAKE 1 TABLET(20 MG) BY MOUTH DAILY 90 tablet 2  . testosterone (ANDRODERM) 4 MG/24HR PT24 patch Place 1 patch onto the skin daily.    Alveda Reasons 2.5 MG TABS tablet TAKE 1 TABLET(2.5 MG) BY MOUTH TWICE DAILY 180 tablet 3   No current facility-administered medications on file prior to visit.    Allergies:   Patient has no known allergies.   Social History   Tobacco Use  . Smoking status: Former Smoker    Packs/day: 1.00    Years: 30.00    Pack years: 30.00    Types: Cigarettes    Quit date: 07/17/2015    Years since quitting: 4.1  . Smokeless tobacco: Never Used  Substance Use Topics  . Alcohol use: Yes    Alcohol/week: 0.0 standard drinks    Comment: rare  . Drug use: No     Family Hx: The patient's family history includes Cancer in his daughter and father; Heart attack (age of onset: 49) in his father; Leukemia in his paternal uncle; Rheum arthritis in his mother. There is no history of Colon cancer.  ROS:   Please see the history of present illness.    Review of Systems  Constitutional: Positive for malaise/fatigue.  Respiratory: Negative.   Cardiovascular: Negative.   Gastrointestinal: Negative.   Musculoskeletal: Negative.   Neurological: Negative.   Psychiatric/Behavioral: Negative.   All other systems reviewed and are negative.    Labs/Other Tests and Data Reviewed:     Recent Labs: 06/14/2019: Hemoglobin 13.2; Platelets 273.0   Recent Lipid Panel Lab Results  Component Value Date/Time   CHOL 133 09/05/2018 09:20 AM   TRIG 68 09/05/2018 09:20 AM   HDL 42 09/05/2018 09:20 AM   CHOLHDL 3.2 09/05/2018 09:20 AM   CHOLHDL 3.5 08/09/2017 09:58 AM   LDLCALC 77 09/05/2018 09:20 AM   LDLDIRECT 140.9 11/15/2012 08:39 AM    Wt Readings from Last 3 Encounters:  07/19/19 186 lb (84.4 kg)  06/12/19 186 lb (84.4 kg)  06/07/19 178 lb 9.6 oz (81 kg)     Exam:    Vital Signs:   BP 140/80 (BP Location: Left Arm, Patient Position: Sitting, Cuff Size: Normal)   Pulse 68   Ht 5\' 8"  (1.727 m)   Wt 192 lb (87.1 kg)   SpO2 99%   BMI  29.19 kg/m  Constitutional:  oriented to person, place, and time. No distress.  HENT:  Head: Grossly normal Eyes:  no discharge. No scleral icterus.  Neck: No JVD, no carotid bruits  Cardiovascular: Regular rate and rhythm, no murmurs appreciated Pulmonary/Chest: Clear to auscultation bilaterally, no wheezes or rails Abdominal: Soft.  no distension.  no tenderness.  Musculoskeletal: Normal range of motion Neurological:  normal muscle tone. Coordination normal. No atrophy Skin: Skin warm and dry Psychiatric: normal affect, pleasant   ASSESSMENT & PLAN:    Coronary artery disease involving native coronary artery of native heart with unstable angina pectoris (HCC) Currently with no symptoms of angina. No further workup at this time. Continue current medication regimen.  S/P CABG x 3 No further testing needed  Mixed hyperlipidemia LDL above goal Recommended diet modification Could add Zetia if needed  Chronic fatigue Recommended walking program   Disposition: Follow-up in 12 months   Signed, Esmond Plants, MD, Ph.D [facility]

## 2019-09-18 ENCOUNTER — Encounter: Payer: Self-pay | Admitting: Cardiovascular Disease

## 2019-09-18 ENCOUNTER — Other Ambulatory Visit: Payer: Self-pay

## 2019-09-18 ENCOUNTER — Ambulatory Visit (INDEPENDENT_AMBULATORY_CARE_PROVIDER_SITE_OTHER): Payer: BC Managed Care – PPO | Admitting: Cardiovascular Disease

## 2019-09-18 VITALS — BP 140/80 | HR 68 | Ht 68.0 in | Wt 192.0 lb

## 2019-09-18 DIAGNOSIS — R5383 Other fatigue: Secondary | ICD-10-CM

## 2019-09-18 DIAGNOSIS — R42 Dizziness and giddiness: Secondary | ICD-10-CM

## 2019-09-18 DIAGNOSIS — Z951 Presence of aortocoronary bypass graft: Secondary | ICD-10-CM | POA: Diagnosis not present

## 2019-09-18 DIAGNOSIS — E782 Mixed hyperlipidemia: Secondary | ICD-10-CM | POA: Diagnosis not present

## 2019-09-18 DIAGNOSIS — I25110 Atherosclerotic heart disease of native coronary artery with unstable angina pectoris: Secondary | ICD-10-CM

## 2019-09-18 NOTE — Patient Instructions (Addendum)

## 2019-09-21 DIAGNOSIS — M170 Bilateral primary osteoarthritis of knee: Secondary | ICD-10-CM | POA: Diagnosis not present

## 2019-09-22 DIAGNOSIS — M25562 Pain in left knee: Secondary | ICD-10-CM | POA: Diagnosis not present

## 2019-09-22 DIAGNOSIS — M170 Bilateral primary osteoarthritis of knee: Secondary | ICD-10-CM | POA: Diagnosis not present

## 2019-09-22 DIAGNOSIS — M25561 Pain in right knee: Secondary | ICD-10-CM | POA: Diagnosis not present

## 2019-09-25 DIAGNOSIS — M170 Bilateral primary osteoarthritis of knee: Secondary | ICD-10-CM | POA: Diagnosis not present

## 2019-09-25 DIAGNOSIS — M25562 Pain in left knee: Secondary | ICD-10-CM | POA: Diagnosis not present

## 2019-09-25 DIAGNOSIS — M25561 Pain in right knee: Secondary | ICD-10-CM | POA: Diagnosis not present

## 2019-09-28 DIAGNOSIS — M170 Bilateral primary osteoarthritis of knee: Secondary | ICD-10-CM | POA: Diagnosis not present

## 2019-09-28 DIAGNOSIS — M25562 Pain in left knee: Secondary | ICD-10-CM | POA: Diagnosis not present

## 2019-09-28 DIAGNOSIS — M25561 Pain in right knee: Secondary | ICD-10-CM | POA: Diagnosis not present

## 2019-10-03 DIAGNOSIS — M170 Bilateral primary osteoarthritis of knee: Secondary | ICD-10-CM | POA: Diagnosis not present

## 2019-10-03 DIAGNOSIS — M25562 Pain in left knee: Secondary | ICD-10-CM | POA: Diagnosis not present

## 2019-10-03 DIAGNOSIS — M25561 Pain in right knee: Secondary | ICD-10-CM | POA: Diagnosis not present

## 2019-10-05 DIAGNOSIS — M25561 Pain in right knee: Secondary | ICD-10-CM | POA: Diagnosis not present

## 2019-10-05 DIAGNOSIS — M170 Bilateral primary osteoarthritis of knee: Secondary | ICD-10-CM | POA: Diagnosis not present

## 2019-10-05 DIAGNOSIS — M25562 Pain in left knee: Secondary | ICD-10-CM | POA: Diagnosis not present

## 2019-10-10 ENCOUNTER — Other Ambulatory Visit: Payer: Self-pay | Admitting: Family Medicine

## 2019-10-10 DIAGNOSIS — M25561 Pain in right knee: Secondary | ICD-10-CM | POA: Diagnosis not present

## 2019-10-10 DIAGNOSIS — M170 Bilateral primary osteoarthritis of knee: Secondary | ICD-10-CM | POA: Diagnosis not present

## 2019-10-10 DIAGNOSIS — M25562 Pain in left knee: Secondary | ICD-10-CM | POA: Diagnosis not present

## 2019-10-11 NOTE — Telephone Encounter (Signed)
Last office visit 07/19/2019 with Dr. Lorelei Pont for left knee pain.  Last refilled 07/19/2019 for #30 with 2 refills. No future appointments.  Patient of Dr. Hulen Shouts.  Does not look like he has reestablished with new PCP.  Ok to refill?

## 2019-10-12 DIAGNOSIS — M25561 Pain in right knee: Secondary | ICD-10-CM | POA: Diagnosis not present

## 2019-10-12 DIAGNOSIS — M25562 Pain in left knee: Secondary | ICD-10-CM | POA: Diagnosis not present

## 2019-10-12 DIAGNOSIS — M170 Bilateral primary osteoarthritis of knee: Secondary | ICD-10-CM | POA: Diagnosis not present

## 2019-10-16 DIAGNOSIS — M170 Bilateral primary osteoarthritis of knee: Secondary | ICD-10-CM | POA: Diagnosis not present

## 2019-10-16 DIAGNOSIS — M25561 Pain in right knee: Secondary | ICD-10-CM | POA: Diagnosis not present

## 2019-10-16 DIAGNOSIS — M25562 Pain in left knee: Secondary | ICD-10-CM | POA: Diagnosis not present

## 2019-10-19 DIAGNOSIS — M25562 Pain in left knee: Secondary | ICD-10-CM | POA: Diagnosis not present

## 2019-10-19 DIAGNOSIS — M25561 Pain in right knee: Secondary | ICD-10-CM | POA: Diagnosis not present

## 2019-10-19 DIAGNOSIS — M170 Bilateral primary osteoarthritis of knee: Secondary | ICD-10-CM | POA: Diagnosis not present

## 2019-10-23 DIAGNOSIS — M25561 Pain in right knee: Secondary | ICD-10-CM | POA: Diagnosis not present

## 2019-10-23 DIAGNOSIS — M170 Bilateral primary osteoarthritis of knee: Secondary | ICD-10-CM | POA: Diagnosis not present

## 2019-10-23 DIAGNOSIS — M25562 Pain in left knee: Secondary | ICD-10-CM | POA: Diagnosis not present

## 2019-10-23 DIAGNOSIS — Z0189 Encounter for other specified special examinations: Secondary | ICD-10-CM | POA: Diagnosis not present

## 2019-10-25 DIAGNOSIS — M170 Bilateral primary osteoarthritis of knee: Secondary | ICD-10-CM | POA: Diagnosis not present

## 2019-10-25 DIAGNOSIS — M25561 Pain in right knee: Secondary | ICD-10-CM | POA: Diagnosis not present

## 2019-10-25 DIAGNOSIS — M25562 Pain in left knee: Secondary | ICD-10-CM | POA: Diagnosis not present

## 2019-11-01 DIAGNOSIS — R600 Localized edema: Secondary | ICD-10-CM | POA: Diagnosis not present

## 2019-11-01 DIAGNOSIS — M25561 Pain in right knee: Secondary | ICD-10-CM | POA: Diagnosis not present

## 2019-11-01 DIAGNOSIS — M25562 Pain in left knee: Secondary | ICD-10-CM | POA: Diagnosis not present

## 2019-11-01 DIAGNOSIS — M170 Bilateral primary osteoarthritis of knee: Secondary | ICD-10-CM | POA: Diagnosis not present

## 2019-11-03 DIAGNOSIS — M25561 Pain in right knee: Secondary | ICD-10-CM | POA: Diagnosis not present

## 2019-11-03 DIAGNOSIS — M170 Bilateral primary osteoarthritis of knee: Secondary | ICD-10-CM | POA: Diagnosis not present

## 2019-11-03 DIAGNOSIS — M25562 Pain in left knee: Secondary | ICD-10-CM | POA: Diagnosis not present

## 2019-11-08 DIAGNOSIS — M170 Bilateral primary osteoarthritis of knee: Secondary | ICD-10-CM | POA: Diagnosis not present

## 2019-11-08 DIAGNOSIS — M25562 Pain in left knee: Secondary | ICD-10-CM | POA: Diagnosis not present

## 2019-11-08 DIAGNOSIS — M25561 Pain in right knee: Secondary | ICD-10-CM | POA: Diagnosis not present

## 2019-11-10 DIAGNOSIS — M25561 Pain in right knee: Secondary | ICD-10-CM | POA: Diagnosis not present

## 2019-11-10 DIAGNOSIS — M170 Bilateral primary osteoarthritis of knee: Secondary | ICD-10-CM | POA: Diagnosis not present

## 2019-11-10 DIAGNOSIS — M25562 Pain in left knee: Secondary | ICD-10-CM | POA: Diagnosis not present

## 2019-11-15 DIAGNOSIS — M25562 Pain in left knee: Secondary | ICD-10-CM | POA: Diagnosis not present

## 2019-11-15 DIAGNOSIS — M25561 Pain in right knee: Secondary | ICD-10-CM | POA: Diagnosis not present

## 2019-11-15 DIAGNOSIS — M170 Bilateral primary osteoarthritis of knee: Secondary | ICD-10-CM | POA: Diagnosis not present

## 2019-11-17 DIAGNOSIS — M25562 Pain in left knee: Secondary | ICD-10-CM | POA: Diagnosis not present

## 2019-11-17 DIAGNOSIS — M170 Bilateral primary osteoarthritis of knee: Secondary | ICD-10-CM | POA: Diagnosis not present

## 2019-11-17 DIAGNOSIS — M25561 Pain in right knee: Secondary | ICD-10-CM | POA: Diagnosis not present

## 2019-12-07 ENCOUNTER — Telehealth: Payer: Self-pay | Admitting: Family Medicine

## 2019-12-07 NOTE — Telephone Encounter (Signed)
Tried to call to see what pt wants to do since Dr Deborra Medina is no longer her, Lvm for pt to call back

## 2019-12-13 ENCOUNTER — Other Ambulatory Visit: Payer: Self-pay | Admitting: Cardiovascular Disease

## 2019-12-19 DIAGNOSIS — M25562 Pain in left knee: Secondary | ICD-10-CM | POA: Diagnosis not present

## 2019-12-19 DIAGNOSIS — Z0189 Encounter for other specified special examinations: Secondary | ICD-10-CM | POA: Diagnosis not present

## 2019-12-19 DIAGNOSIS — M25561 Pain in right knee: Secondary | ICD-10-CM | POA: Diagnosis not present

## 2020-01-01 DIAGNOSIS — M25562 Pain in left knee: Secondary | ICD-10-CM | POA: Diagnosis not present

## 2020-01-08 DIAGNOSIS — M170 Bilateral primary osteoarthritis of knee: Secondary | ICD-10-CM | POA: Diagnosis not present

## 2020-01-18 ENCOUNTER — Other Ambulatory Visit: Payer: Self-pay | Admitting: Family Medicine

## 2020-01-18 NOTE — Telephone Encounter (Signed)
Last office visit 07/19/2019 for knee pain referred by Dr. Deborra Medina.  Last refilled 10/11/2019 for #30 with 2  refills.  No future appointments.

## 2020-01-30 DIAGNOSIS — Z79899 Other long term (current) drug therapy: Secondary | ICD-10-CM | POA: Diagnosis not present

## 2020-01-30 DIAGNOSIS — N500 Atrophy of testis: Secondary | ICD-10-CM | POA: Diagnosis not present

## 2020-01-30 DIAGNOSIS — E291 Testicular hypofunction: Secondary | ICD-10-CM | POA: Diagnosis not present

## 2020-01-30 DIAGNOSIS — M170 Bilateral primary osteoarthritis of knee: Secondary | ICD-10-CM | POA: Diagnosis not present

## 2020-01-31 ENCOUNTER — Telehealth: Payer: Self-pay | Admitting: Cardiovascular Disease

## 2020-01-31 NOTE — Telephone Encounter (Addendum)
This patient surgery is not till early December.  At his last office visit in Dec 2020 he was told he could follow-up with cardiology in 12 months.  We will arrange for the patient to have an office visit in November for his yearly visit and preop clearance can be addressed then.  Kerin Ransom PA-C 01/31/2020 3:39 PM

## 2020-01-31 NOTE — Telephone Encounter (Signed)
   [facility] Pre-operative Risk Assessment    HEARTCARE STAFF: - Please ensure there is not already an duplicate clearance open for this procedure. - Under Visit Info/Reason for Call, type in Other and utilize the format Clearance MM/DD/YY or Clearance TBD. Do not use dashes or single digits. - If request is for dental extraction, please clarify the # of teeth to be extracted.  Request for surgical clearance:  1. What type of surgery is being performed? Left Partial Makoplasty  2. When is this surgery scheduled? 05/23/20   3. What type of clearance is required (medical clearance vs. Pharmacy clearance to hold med vs. Both)? both  4. Are there any medications that need to be held prior to surgery and how long? Instructions for Xarelto and aspirin 16m  5. Practice name and name of physician performing surgery? [facility] [place] JAntony Haste  6. What is the office phone number? [phone]: [phone]   0   What is the office fax number? [phone] [phone] 8.   Anesthesia type (None, local, MAC, general) ? Spinal    AAce Gins8/04/2020, 2:16 PM  _________________________________________________________________   (provider comments below)

## 2020-01-31 NOTE — Telephone Encounter (Signed)
Appointment schedule for 11/16 @ 2:40 pm

## 2020-02-02 ENCOUNTER — Other Ambulatory Visit: Payer: Self-pay | Admitting: Cardiovascular Disease

## 2020-02-05 NOTE — Telephone Encounter (Signed)
Pt's age 59, wt 87.1 kg, last ov w/ TG 09/18/19.  Pt on low-dose xarelto.

## 2020-02-05 NOTE — Telephone Encounter (Signed)
Please review for refill. Thanks!  

## 2020-02-19 DIAGNOSIS — Z0189 Encounter for other specified special examinations: Secondary | ICD-10-CM | POA: Diagnosis not present

## 2020-03-14 DIAGNOSIS — Z23 Encounter for immunization: Secondary | ICD-10-CM | POA: Diagnosis not present

## 2020-03-18 ENCOUNTER — Encounter: Payer: Self-pay | Admitting: *Deleted

## 2020-03-18 ENCOUNTER — Other Ambulatory Visit: Payer: Self-pay

## 2020-03-18 ENCOUNTER — Emergency Department: Payer: BC Managed Care – PPO

## 2020-03-18 ENCOUNTER — Other Ambulatory Visit: Payer: Self-pay | Admitting: Cardiovascular Disease

## 2020-03-18 DIAGNOSIS — Z20822 Contact with and (suspected) exposure to covid-19: Secondary | ICD-10-CM | POA: Insufficient documentation

## 2020-03-18 DIAGNOSIS — Z87891 Personal history of nicotine dependence: Secondary | ICD-10-CM | POA: Insufficient documentation

## 2020-03-18 DIAGNOSIS — Z7982 Long term (current) use of aspirin: Secondary | ICD-10-CM | POA: Diagnosis not present

## 2020-03-18 DIAGNOSIS — Z951 Presence of aortocoronary bypass graft: Secondary | ICD-10-CM | POA: Diagnosis not present

## 2020-03-18 DIAGNOSIS — R55 Syncope and collapse: Secondary | ICD-10-CM | POA: Diagnosis not present

## 2020-03-18 DIAGNOSIS — I2510 Atherosclerotic heart disease of native coronary artery without angina pectoris: Secondary | ICD-10-CM | POA: Insufficient documentation

## 2020-03-18 DIAGNOSIS — R42 Dizziness and giddiness: Secondary | ICD-10-CM | POA: Diagnosis not present

## 2020-03-18 DIAGNOSIS — R9431 Abnormal electrocardiogram [ECG] [EKG]: Secondary | ICD-10-CM | POA: Diagnosis not present

## 2020-03-18 LAB — CBC
HCT: 46 % (ref 39.0–52.0)
Hemoglobin: 15.5 g/dL (ref 13.0–17.0)
MCH: 31.6 pg (ref 26.0–34.0)
MCHC: 33.7 g/dL (ref 30.0–36.0)
MCV: 93.7 fL (ref 80.0–100.0)
Platelets: 324 10*3/uL (ref 150–400)
RBC: 4.91 MIL/uL (ref 4.22–5.81)
RDW: 12.9 % (ref 11.5–15.5)
WBC: 7.5 10*3/uL (ref 4.0–10.5)
nRBC: 0 % (ref 0.0–0.2)

## 2020-03-18 LAB — BASIC METABOLIC PANEL
Anion gap: 11 (ref 5–15)
BUN: 13 mg/dL (ref 6–20)
CO2: 25 mmol/L (ref 22–32)
Calcium: 9.3 mg/dL (ref 8.9–10.3)
Chloride: 104 mmol/L (ref 98–111)
Creatinine, Ser: 1.38 mg/dL — ABNORMAL HIGH (ref 0.61–1.24)
GFR calc Af Amer: >60 mL/min (ref >60)
GFR calc non Af Amer: 56 mL/min — ABNORMAL LOW (ref >60)
Glucose, Bld: 119 mg/dL — ABNORMAL HIGH (ref 70–99)
Potassium: 3.2 mmol/L — ABNORMAL LOW (ref 3.5–5.1)
Sodium: 140 mmol/L (ref 135–145)

## 2020-03-18 LAB — TROPONIN I (HIGH SENSITIVITY): Troponin I (High Sensitivity): 9 ng/L (ref <18)

## 2020-03-18 NOTE — ED Triage Notes (Signed)
Pt ambulatory to triage.  Pt reports feeling tired and having lightheadness/dizzy today.   Hx cabg.  No n/v/d/  No chest pain or sob.  Pt alert  Speech clear.

## 2020-03-19 ENCOUNTER — Emergency Department
Admission: EM | Admit: 2020-03-19 | Discharge: 2020-03-19 | Disposition: A | Discharge status: Home / Self Care | Payer: BC Managed Care – PPO | Attending: Emergency Medicine | Admitting: Emergency Medicine

## 2020-03-19 ENCOUNTER — Telehealth: Payer: Self-pay | Admitting: Cardiovascular Disease

## 2020-03-19 DIAGNOSIS — R55 Syncope and collapse: Secondary | ICD-10-CM

## 2020-03-19 LAB — RESPIRATORY PANEL BY RT PCR (FLU A&B, COVID)
Influenza A by PCR: NEGATIVE
Influenza B by PCR: NEGATIVE
SARS Coronavirus 2 by RT PCR: NEGATIVE

## 2020-03-19 NOTE — Telephone Encounter (Signed)
Patient recent admission to ED for lightheadedness sweating and fatigue   He was told to ask for review of chart to see if ov needed.   Please call.

## 2020-03-19 NOTE — Telephone Encounter (Signed)
Spoke with patient and he states that he was having some fatigue and sluggishness the last couple of days. Went to work today and became lightheaded, sluggish, and had a cold sweat and felt horrible. They wanted him to come in and see what should be done. Reviewed signs and symptoms which would require him to go back to the ED for further evaluation. He verbalized signs and symptoms to monitor for and advised to call back if he should have any persistent concerns. Appointment scheduled and patient verbalized understanding of our conversation, agreement with plan, and had no further questions at this time.

## 2020-03-19 NOTE — ED Provider Notes (Signed)
[facility] Emergency Department Provider Note   ____________________________________________    I have reviewed the triage vital signs and the nursing notes.   HISTORY  Chief Complaint Dizziness     HPI Jeffrey Spence is a 59 y.o. male with history of CAD status post CABG who presents with complaints of episode of lightheadedness which occurred yesterday evening at work.  He reports he is feeling improved now.  He states that this is occurred in the setting of multiple months of chronic fatigue for which she has had testosterone injections which initially did work.  He denies any chest pain or palpitations during this episode where he felt lightheaded.  He was only mildly exerting himself.  No shortness of breath.  No new medications  Past Medical History:  Diagnosis Date  . Arthritis   . Blood transfusion without reported diagnosis 2017   triple by pass  . CAD (coronary artery disease)    a. nstemi 1.2017; b. cardiac cath 06/2015 with LM and severe 3 veseel dz (full report pending)  . Family history of leukemia   . HLD (hyperlipidemia)    pt. denies  . NSTEMI (non-ST elevated myocardial infarction) ([place]) 06/2015  . Personal history of colonic polyps   . Postoperative atrial fibrillation ([place]) 07/01/2017  . Tobacco abuse     Patient Active Problem List   Diagnosis Date Noted  . Vitamin B12 deficiency 06/07/2019  . Nausea 06/07/2019  . Fatigue 09/14/2018  . Insomnia 09/14/2018  . Genetic testing 09/05/2018  . Family history of leukemia   . Personal history of colonic polyps   . Allergic rhinitis 03/03/2018  . Postoperative atrial fibrillation ([place]) 07/01/2017  . Hyperlipidemia 10/07/2015  . Coronary artery disease involving native coronary artery of native heart with unstable angina pectoris ([place])   . S/P CABG x 3 07/25/2015  . NSTEMI (non-ST elevated myocardial infarction) ([place])   . Acute coronary syndrome ([place])   . Unstable angina  pectoris ([place])   . Smoker   . Coronary artery disease involving left main coronary artery   . Swelling of arm 01/20/2013  . OA (osteoarthritis) of knee 01/12/2013  . Low testosterone in male 08/14/2010  . TOBACCO ABUSE 08/12/2010    Past Surgical History:  Procedure Laterality Date  . CARDIAC CATHETERIZATION Bilateral 07/23/2015   Procedure: Left Heart Cath and Coronary Angiography;  Surgeon: Minna Merritts, MD;  Location: [place] [facility];  Service: Cardiovascular;  Laterality: Bilateral;  . CORONARY ARTERY BYPASS GRAFT N/A 07/25/2015   Procedure: CORONARY ARTERY BYPASS GRAFTING X 3 UTILIZING BILATERAL IMA AND LEFT RADIAL ARTERY;  Surgeon: Melrose Nakayama, MD;  Location: [place];  Service: Open Heart Surgery;  Laterality: N/A;  . fractured toe Right 2013   great toe/with pin  . RADIAL ARTERY HARVEST Left 07/25/2015   Procedure: RADIAL ARTERY HARVEST;  Surgeon: Melrose Nakayama, MD;  Location: [place];  Service: Open Heart Surgery;  Laterality: Left;  . TEE WITHOUT CARDIOVERSION N/A 07/25/2015   Procedure: TRANSESOPHAGEAL ECHOCARDIOGRAM (TEE);  Surgeon: Melrose Nakayama, MD;  Location: [place];  Service: Open Heart Surgery;  Laterality: N/A;    Prior to Admission medications   Medication Sig Start Date End Date Taking? Authorizing Provider  aspirin 81 MG tablet Take 1 tablet (81 mg total) by mouth daily. 10/07/15   Minna Merritts, MD  celecoxib (CELEBREX) 200 MG capsule TAKE 1 CAPSULE(200 MG) BY MOUTH DAILY 01/19/20   Owens Loffler, MD  cetirizine (ZYRTEC) 10 MG tablet Take 10 mg by mouth daily as needed. 09/08/18   [provider]  Cholecalciferol (VITAMIN D3) 50 MCG (2000 UT) capsule Take 2,000 Units by mouth daily.    [provider]  Cyanocobalamin (B-12 PO) Take by mouth daily.     [provider]  diclofenac sodium (VOLTAREN) 1 % GEL Apply 2 g topically daily as needed.    [provider]  famotidine (PEPCID) 10 MG tablet Take 10 mg by  mouth daily.    [provider]  fluticasone (FLONASE) 50 MCG/ACT nasal spray Place 2 sprays into both nostrils daily. In each nostril 03/03/18   Lucille Passy, MD  JATENZO 237 MG CAPS Take 1 capsule by mouth 2 (two) times daily. 09/14/19   [provider]  metoprolol tartrate (LOPRESSOR) 25 MG tablet TAKE 1 TABLET(25 MG) BY MOUTH TWICE DAILY 03/18/20   Minna Merritts, MD  Multiple Vitamins-Minerals (MULTIVITAMIN WITH MINERALS) tablet Take 1 tablet by mouth daily.    [provider]  Keene Breath COQ10/UBIQUINOL/MEGA PO Take 1,000 mg by mouth daily.    [provider]  rosuvastatin (CRESTOR) 20 MG tablet TAKE 1 TABLET(20 MG) BY MOUTH DAILY 12/13/19   Minna Merritts, MD  testosterone (ANDRODERM) 4 MG/24HR PT24 patch Place 1 patch onto the skin daily.    [provider]  XARELTO 2.5 MG TABS tablet TAKE 1 TABLET(2.5 MG) BY MOUTH TWICE DAILY 02/05/20   Minna Merritts, MD  celecoxib (CELEBREX) 200 MG capsule TAKE 1 CAPSULE(200 MG) BY MOUTH DAILY 10/11/19   Copland, Frederico Hamman, MD     Allergies Patient has no known allergies.  Family History  Problem Relation Age of Onset  . Heart attack Father 69  . Cancer Father        blood  . Rheum arthritis Mother   . Leukemia Paternal Uncle   . Cancer Daughter        Vulvar (d. 21)  . Colon cancer Neg Hx     Social History Social History   Tobacco Use  . Smoking status: Former Smoker    Packs/day: 1.00    Years: 30.00    Pack years: 30.00    Types: Cigarettes    Quit date: 07/17/2015    Years since quitting: 4.6  . Smokeless tobacco: Never Used  Vaping Use  . Vaping Use: Never used  Substance Use Topics  . Alcohol use: Yes    Alcohol/week: 0.0 standard drinks    Comment: rare  . Drug use: No    Review of Systems  Constitutional: No fever/chills Eyes: No visual changes.  ENT: No sore throat. Cardiovascular: As above Respiratory: Denies shortness of breath. Gastrointestinal: No abdominal pain.   No nausea, no vomiting.   Genitourinary: Negative for dysuria. Musculoskeletal: Negative for back pain. Skin: Negative for rash. Neurological: Negative for headaches or weakness   ____________________________________________   PHYSICAL EXAM:  VITAL SIGNS: ED Triage Vitals  Enc Vitals Group     BP 03/18/20 2043 (!) 145/92     Pulse Rate 03/18/20 2043 (!) 102     Resp 03/18/20 2043 18     Temp 03/18/20 2043 98.7 F (37.1 C)     Temp Source 03/18/20 2043 Oral     SpO2 03/18/20 2043 97 %     Weight 03/18/20 2044 81.6 kg (180 lb)     Height 03/18/20 2044 1.727 m (5\' 8" )     Head Circumference --  Peak Flow --      Pain Score 03/18/20 2044 0     Pain Loc --      Pain Edu? --      Excl. in [place]? --     Constitutional: Alert and oriented. No acute distress. Pleasant and interactive  Nose: No congestion/rhinnorhea. Mouth/Throat: Mucous membranes are moist.    Cardiovascular: Normal rate, regular rhythm. Grossly normal heart sounds.  Good peripheral circulation. Respiratory: Normal respiratory effort.  No retractions. Lungs CTAB. Gastrointestinal: Soft and nontender. No distention.  No CVA tenderness. Genitourinary: deferred Musculoskeletal: No lower extremity tenderness nor edema.  Warm and well perfused Neurologic:  Normal speech and language. No gross focal neurologic deficits are appreciated.  Skin:  Skin is warm, dry and intact. No rash noted. Psychiatric: Mood and affect are normal. Speech and behavior are normal.  ____________________________________________   LABS (all labs ordered are listed, but only abnormal results are displayed)  Labs Reviewed  BASIC METABOLIC PANEL - Abnormal; Notable for the following components:      Result Value   Potassium 3.2 (*)    Glucose, Bld 119 (*)    Creatinine, Ser 1.38 (*)    GFR calc non Af Amer 56 (*)    All other components within normal limits  RESPIRATORY PANEL BY RT PCR (FLU A&B, COVID)  CBC  TROPONIN I (HIGH  SENSITIVITY)   ____________________________________________  EKG  ED ECG REPORT I, Lavonia Drafts, the attending physician, personally viewed and interpreted this ECG.  Date: 03/19/2020  Rhythm: normal sinus rhythm QRS Axis: normal Intervals: normal ST/T Wave abnormalities: normal Narrative Interpretation: no evidence of acute ischemia  ____________________________________________  RADIOLOGY  Chest x-ray reviewed by me, no infiltrate effusion or pneumothorax ____________________________________________   PROCEDURES  Procedure(s) performed: No  Procedures   Critical Care performed: No ____________________________________________   INITIAL IMPRESSION / ASSESSMENT AND PLAN / ED COURSE  Pertinent labs & imaging results that were available during my care of the patient were reviewed by me and considered in my medical decision making (see chart for details).  Patient presents after episode of lightheadedness which lasted a brief period of time, near syncopal episode.  Differential is extensive including vasovagal, dehydration, no palpitations to suggest arrhythmia.  No chest pain to suggest cardiac origin.  EKG is quite reassuring.  Unremarkable exam.  Lab work overall is reassuring, mild hypokalemia is nonspecific, normal white blood cell count, normal hemoglobin,  Had long discussion with patient, recommend outpatient follow-up with Dr. Rockey Situ his cardiologist for further evaluation given his history of a CABG.  Strict return precautions discussed, patient agrees with plan.    ____________________________________________   FINAL CLINICAL IMPRESSION(S) / ED DIAGNOSES  Final diagnoses:  Near syncope        Note:  This document was prepared using Dragon voice recognition software and may include unintentional dictation errors.   Lavonia Drafts, MD 03/19/20 [id number]

## 2020-04-08 NOTE — Progress Notes (Signed)
Date:  04/09/2020   ID:  Jeffrey Spence, DOB 04/06/1961, MRN [id number]  Patient Location:  [address] [place] [address]   Provider location:   [facility], [place] office  PCP:  Patient, No Pcp Per  Cardiologist:  Ida Rogue, MD    Chief Complaint  Patient presents with  . Other    ED follow up - Patient states symptoms got better starting Wednesday. Meds reviewed verbally with patient.     History of Present Illness:    Jeffrey Spence is a 59 y.o. 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 Rogers Blocker, at work  Given her busy work schedule, has no regular exercise program  cutting the metoprolol in 1/2 twice a day Weight down 12 to 13 pounds  Lab work reviewed Free testosterone level 6.4 Testosterone 190 Total cholesterol 133, LDL 77 HDL 42 TSH 1.9 Elevated alkaline phosphatase, normal AST ALT  EKG personally reviewed by myself on todays visit Shows normal sinus rhythm rate 68 bpm no significant ST-T wave changes    Prior CV studies:   The following studies were reviewed today:  Cardiac catheterization report 80-90% ostial left main disease that did not improve with  nitroglycerin IC 80% proximal LAD disease, long region with moderate calcification 80-90% proximal RCA disease, calcified, ulcerative plaque 70% proximal left circumflex disease Essentially normal LV gram, ejection fraction greater than 55 %  underwent coronary bypass grafting 3 with bilateral mammaries and a left radial artery on 07/25/2015. Postoperatively he had atrial fibrillation. He converted to sinus rhythm with amiodarone. He did not require anticoagulation.    Past Medical History:  Diagnosis Date  . Arthritis   . Blood transfusion without reported diagnosis 2017   triple by pass  . CAD (coronary artery disease)    a. nstemi 1.2017; b. cardiac cath 06/2015 with LM and severe 3 veseel dz (full report pending)  . Family history of leukemia   . HLD (hyperlipidemia)    pt. denies  . NSTEMI (non-ST elevated myocardial infarction) ([facility]) 06/2015  . Personal history of colonic polyps   . Postoperative atrial fibrillation ([facility]) 07/01/2017  . Tobacco abuse    Past Surgical History:  Procedure Laterality Date  . CARDIAC CATHETERIZATION Bilateral 07/23/2015   Procedure: Left Heart Cath and Coronary Angiography;  Surgeon: Minna Merritts, MD;  Location: [facility];  Service: Cardiovascular;  Laterality: Bilateral;  . CORONARY ARTERY BYPASS GRAFT N/A 07/25/2015   Procedure: CORONARY ARTERY BYPASS GRAFTING X 3 UTILIZING BILATERAL IMA AND LEFT RADIAL ARTERY;  Surgeon: Melrose Nakayama, MD;  Location: [facility];  Service: Open Heart Surgery;  Laterality: N/A;  . fractured toe Right 2013   great toe/with pin  .  RADIAL ARTERY HARVEST Left 07/25/2015   Procedure: RADIAL ARTERY HARVEST;  Surgeon: Melrose Nakayama, MD;  Location: [facility];  Service: Open Heart Surgery;  Laterality: Left;  . TEE WITHOUT CARDIOVERSION N/A 07/25/2015   Procedure: TRANSESOPHAGEAL ECHOCARDIOGRAM (TEE);  Surgeon: Melrose Nakayama, MD;  Location: [facility];  Service: Open Heart Surgery;  Laterality: N/A;      Current Outpatient Medications on File Prior to Visit  Medication Sig Dispense Refill  . aspirin 81 MG tablet Take 1 tablet (81 mg total) by mouth daily.    . celecoxib (CELEBREX) 200 MG capsule TAKE 1 CAPSULE(200 MG) BY MOUTH DAILY 30 capsule 2  . cetirizine (ZYRTEC) 10 MG tablet Take 10 mg by mouth daily as needed.    . Cholecalciferol (VITAMIN D3) 50 MCG (2000 UT) capsule Take 2,000 Units by mouth daily.    . Cyanocobalamin (B-12 PO) Take by mouth daily.     . diclofenac sodium (VOLTAREN) 1 % GEL Apply 2 g topically daily as needed.    . famotidine (PEPCID) 10 MG tablet Take 10 mg by mouth daily.    . fluticasone (FLONASE) 50 MCG/ACT nasal spray Place 2 sprays into both nostrils daily. In each nostril 16 g 12  . JATENZO 237 MG CAPS Take 1 capsule by mouth 2 (two) times daily.    . metoprolol tartrate (LOPRESSOR) 25 MG tablet TAKE 1 TABLET(25 MG) BY MOUTH TWICE DAILY 180 tablet 2  . Multiple Vitamins-Minerals (MULTIVITAMIN WITH MINERALS) tablet Take 1 tablet by mouth daily.    Keene Breath COQ10/UBIQUINOL/MEGA PO Take 1,000 mg by mouth daily.    . rosuvastatin (CRESTOR) 20 MG tablet TAKE 1 TABLET(20 MG) BY MOUTH DAILY 90 tablet 2  . testosterone (ANDRODERM) 4 MG/24HR PT24 patch Place 1 patch onto the skin daily.    Alveda Reasons 2.5 MG TABS tablet TAKE 1 TABLET(2.5 MG) BY MOUTH TWICE DAILY 180 tablet 0  . [DISCONTINUED] celecoxib (CELEBREX) 200 MG capsule TAKE 1 CAPSULE(200 MG) BY MOUTH DAILY 30 capsule 2   No current facility-administered medications on file prior to visit.    Allergies:   Patient has no known allergies.   Social History   Tobacco Use  . Smoking status: Former Smoker    Packs/day: 1.00    Years: 30.00    Pack years: 30.00    Types: Cigarettes    Quit date: 07/17/2015    Years since quitting: 4.7  . Smokeless tobacco: Never Used  Vaping Use  . Vaping Use: Never used  Substance Use Topics  . Alcohol use: Yes    Alcohol/week: 0.0 standard drinks    Comment: rare  .  Drug use: No     Family Hx: The patient's family history includes Cancer in his daughter and father; Heart attack (age of onset: 15) in his father; Leukemia in his paternal uncle; Rheum arthritis in his mother. There is no history of Colon cancer.  ROS:   Please see the history of present illness.    Review of Systems  Constitutional: Positive for malaise/fatigue.  Respiratory: Negative.   Cardiovascular: Negative.   Gastrointestinal: Negative.   Musculoskeletal: Negative.   Neurological: Negative.   Psychiatric/Behavioral: Negative.   All other systems reviewed and are negative.    Labs/Other Tests and Data Reviewed:    Recent Labs: 03/18/2020: BUN 13; Creatinine, Ser 1.38; Hemoglobin 15.5; Platelets 324; Potassium 3.2; Sodium 140   Recent Lipid Panel Lab Results  Component Value Date/Time   CHOL 133 09/05/2018 09:20  AM   TRIG 68 09/05/2018 09:20 AM   HDL 42 09/05/2018 09:20 AM   CHOLHDL 3.2 09/05/2018 09:20 AM   CHOLHDL 3.5 08/09/2017 09:58 AM   LDLCALC 77 09/05/2018 09:20 AM   LDLDIRECT 140.9 11/15/2012 08:39 AM    Wt Readings from Last 3 Encounters:  04/09/20 179 lb (81.2 kg)  03/18/20 180 lb (81.6 kg)  09/18/19 192 lb (87.1 kg)     Exam:    Vital Signs:   BP 100/64 (BP Location: Left Arm, Patient Position: Sitting, Cuff Size: Normal)   Pulse 72   Ht 5\' 8"  (1.727 m)   Wt 179 lb (81.2 kg)   SpO2 96%   BMI 27.22 kg/m  Constitutional:  oriented to person, place, and time. No distress.  HENT:  Head: Grossly normal Eyes:  no discharge. No scleral icterus.  Neck: No JVD, no carotid bruits  Cardiovascular: Regular rate and rhythm, no murmurs appreciated Pulmonary/Chest: Clear to auscultation bilaterally, no wheezes or rails Abdominal: Soft.  no distension.  no tenderness.  Musculoskeletal: Normal range of motion Neurological:  normal muscle tone. Coordination normal. No atrophy Skin: Skin warm and dry Psychiatric: normal affect, pleasant   ASSESSMENT &  PLAN:    Coronary artery disease involving native coronary artery of native heart with unstable angina pectoris (HCC) Currently with no symptoms of angina. No further workup at this time. Continue current medication regimen.  Lightheadedness Recent episode unclear, concerning for dehydration, renal function high, potassium low Recommend he stay hydrated For any recurrent symptoms additional testing may be needed  S/P CABG x 3 No further testing needed  Mixed hyperlipidemia Continue current medications, reports he has his lab work done through primary care  Chronic fatigue Recommended walking program  Working long hours in a factory    Total encounter time more than 25 minutes  Greater than 50% was spent in counseling and coordination of care with the patient   Signed, Esmond Plants, MD, Ph.D [facility]

## 2020-04-09 ENCOUNTER — Encounter: Payer: Self-pay | Admitting: Cardiovascular Disease

## 2020-04-09 ENCOUNTER — Other Ambulatory Visit: Payer: Self-pay

## 2020-04-09 ENCOUNTER — Ambulatory Visit (INDEPENDENT_AMBULATORY_CARE_PROVIDER_SITE_OTHER): Payer: BC Managed Care – PPO | Admitting: Cardiovascular Disease

## 2020-04-09 VITALS — BP 100/64 | HR 72 | Ht 68.0 in | Wt 179.0 lb

## 2020-04-09 DIAGNOSIS — R42 Dizziness and giddiness: Secondary | ICD-10-CM

## 2020-04-09 DIAGNOSIS — I25110 Atherosclerotic heart disease of native coronary artery with unstable angina pectoris: Secondary | ICD-10-CM

## 2020-04-09 DIAGNOSIS — E782 Mixed hyperlipidemia: Secondary | ICD-10-CM | POA: Diagnosis not present

## 2020-04-09 DIAGNOSIS — Z951 Presence of aortocoronary bypass graft: Secondary | ICD-10-CM | POA: Diagnosis not present

## 2020-04-09 NOTE — Patient Instructions (Signed)
Medication Instructions:  No changes  If you need a refill on your cardiac medications before your next appointment, please call your pharmacy.    Lab work: No new labs needed   If you have labs (blood work) drawn today and your tests are completely normal, you will receive your results only by: . MyChart Message (if you have MyChart) OR . A paper copy in the mail If you have any lab test that is abnormal or we need to change your treatment, we will call you to review the results.   Testing/Procedures: No new testing needed   Follow-Up: At [facility], you and your health needs are our priority.  As part of our continuing mission to provide you with exceptional heart care, we have created designated Provider Care Teams.  These Care Teams include your primary Cardiologist (physician) and Advanced Practice Providers (APPs -  Physician Assistants and Nurse Practitioners) who all work together to provide you with the care you need, when you need it.  . You will need a follow up appointment in 12 months  . Providers on your designated Care Team:   . Christopher Berge, NP . Ryan Dunn, PA-C . Jacquelyn Visser, PA-C  Any Other Special Instructions Will Be Listed Below (If Applicable).  COVID-19 Vaccine Information can be found at: [email or web address] For questions related to vaccine distribution or appointments, please email [email or web address] or call [phone].     

## 2020-05-01 ENCOUNTER — Other Ambulatory Visit: Payer: Self-pay | Admitting: Family Medicine

## 2020-05-01 NOTE — Telephone Encounter (Signed)
Last office visit 07/19/19 for knee pain.  Use to be patient of Dr. Deborra Medina.  No PCP is listed currently for patient.  Last refilled 01/19/2020 for #30 with 2 refills. No future appointments.  Refill?

## 2020-05-02 NOTE — Telephone Encounter (Signed)
Decline:  Can you call  I looked over his chart.  It looks like he is now on Xarelto.  You should not take anti-inflammatories and Xarelto at the same time.  Since Dr. Deborra Medina is not practicing right now, he should find a new PCP.

## 2020-05-02 NOTE — Telephone Encounter (Signed)
Spoke with patient and advised he should not be taking celebrex with xarelto.  Advised he needs to follow up with new PCP.  He states "has not been able to find one I like yet."  Offered options, patient declined at this time.

## 2020-05-07 ENCOUNTER — Ambulatory Visit: Payer: BC Managed Care – PPO | Admitting: Cardiovascular Disease

## 2020-05-09 ENCOUNTER — Other Ambulatory Visit: Payer: Self-pay | Admitting: Cardiovascular Disease

## 2020-05-09 NOTE — Telephone Encounter (Signed)
Pharmacy please comment on anticoagulation pre op.  Kerin Ransom PA-C 05/09/2020 11:13 AM

## 2020-05-09 NOTE — Telephone Encounter (Signed)
Dr Rockey Situ please comment on holding aspirin pre op.  Kerin Ransom PA-C 05/09/2020 11:15 AM

## 2020-05-09 NOTE — Telephone Encounter (Signed)
Patient seen in office sooner but clearance not addressed.    Resending to pool

## 2020-05-09 NOTE — Telephone Encounter (Addendum)
Pt takes low dose Xarelto 2.5mg  BID for CAD. CrCl 53mL/min. Ok to hold for 3 days prior to [employer].

## 2020-05-10 NOTE — Telephone Encounter (Signed)
Refill request

## 2020-05-10 NOTE — Telephone Encounter (Signed)
Pt last saw Dr Rockey Situ 04/09/20, last labs 03/18/20 Creat 1.38, age 59, weight 81.2kg, CrCl 66.2.  Pt is on low dose Xarelto 2.5mg  BID for long term prevention of CV events in chronic CAD.  Will refill rx.

## 2020-05-12 NOTE — Telephone Encounter (Signed)
Would prefer he stay on low dose asa for procedure while off xarelto He will need to check with surgeon, if unable to perform spinal on asa, he might need other forms of anesthesia, such as general

## 2020-05-13 NOTE — Telephone Encounter (Signed)
Mendel Ryder is the contact to call at [phone]

## 2020-05-13 NOTE — Telephone Encounter (Signed)
Pre-op covering staff, Dr. Rockey Situ recommends patient staying on low dose Aspirin for procedure while off Xarelto. Can we see if surgeon is OK with this? If not able to perform spinal on Aspirin, Dr. Rockey Situ states he might need other forms of anesthesia.   Thank you!

## 2020-05-13 NOTE — Telephone Encounter (Signed)
I s/w Jeffrey Spence with [facility] and confirmed ok to remain on ASA for procedure, though Xarelto to be held x 3 days prior to procedure. I assured Jeffrey Spence I will update Dr. Rockey Situ and Pre op provider. Once final clearance notes we will fax over clearance. Jeffrey Spence thanked me for our help.

## 2020-05-13 NOTE — Telephone Encounter (Signed)
[facility] calling to check on status of clearance  Please advise

## 2020-05-14 NOTE — Telephone Encounter (Addendum)
   Primary Cardiologist: Ida Rogue, MD  Chart reviewed as part of pre-operative protocol coverage. Patient was recently seen by Dr. Rockey Situ on 04/09/2020 at which time he reported recent episode of near syncope for which he was seen in th ED. This was an isolated event and felt to possibly be due to dehydration. Otherwise, doing well from a cardiac standpoint. Patient discussed need for upcoming knee surgery at that time and no additional cardiac work-up was recommended. Patient was contacted today for further pre-op evaluation and reported doing well since last visit. No recurrent near syncope/syncope. No chest pain, shortness of breath, or palpitations. He is able to complete >4.0 METS without any problems. Given past medical history and time since last visit, based on ACC/AHA guidelines, Jeffrey Spence would be at acceptable risk for the planned procedure without further cardiovascular testing.   Per Pharmacy, [place] to hold Xarelto for 3 days prior to procedure. This should be resumed as soon as able postoperatively. Dr. Rockey Situ recommends patient remain on Aspirin perioperatively.    I will route this recommendation to the requesting party via Epic fax function and remove from pre-op pool.  Please call with questions.  Darreld Mclean, PA-C 05/14/2020, 8:29 AM

## 2020-05-21 DIAGNOSIS — M25662 Stiffness of left knee, not elsewhere classified: Secondary | ICD-10-CM | POA: Diagnosis not present

## 2020-05-21 DIAGNOSIS — M25562 Pain in left knee: Secondary | ICD-10-CM | POA: Diagnosis not present

## 2020-05-23 DIAGNOSIS — K219 Gastro-esophageal reflux disease without esophagitis: Secondary | ICD-10-CM | POA: Diagnosis not present

## 2020-05-23 DIAGNOSIS — Z87891 Personal history of nicotine dependence: Secondary | ICD-10-CM | POA: Diagnosis not present

## 2020-05-23 DIAGNOSIS — M25762 Osteophyte, left knee: Secondary | ICD-10-CM | POA: Diagnosis not present

## 2020-05-23 DIAGNOSIS — I252 Old myocardial infarction: Secondary | ICD-10-CM | POA: Diagnosis not present

## 2020-05-23 DIAGNOSIS — Z7982 Long term (current) use of aspirin: Secondary | ICD-10-CM | POA: Diagnosis not present

## 2020-05-23 DIAGNOSIS — M1712 Unilateral primary osteoarthritis, left knee: Secondary | ICD-10-CM | POA: Diagnosis not present

## 2020-05-23 DIAGNOSIS — Z7901 Long term (current) use of anticoagulants: Secondary | ICD-10-CM | POA: Diagnosis not present

## 2020-05-23 DIAGNOSIS — Z791 Long term (current) use of non-steroidal anti-inflammatories (NSAID): Secondary | ICD-10-CM | POA: Diagnosis not present

## 2020-05-23 DIAGNOSIS — I2510 Atherosclerotic heart disease of native coronary artery without angina pectoris: Secondary | ICD-10-CM | POA: Diagnosis not present

## 2020-05-23 DIAGNOSIS — Z951 Presence of aortocoronary bypass graft: Secondary | ICD-10-CM | POA: Diagnosis not present

## 2020-05-30 DIAGNOSIS — M25662 Stiffness of left knee, not elsewhere classified: Secondary | ICD-10-CM | POA: Diagnosis not present

## 2020-05-30 DIAGNOSIS — M25562 Pain in left knee: Secondary | ICD-10-CM | POA: Diagnosis not present

## 2020-06-04 DIAGNOSIS — M25662 Stiffness of left knee, not elsewhere classified: Secondary | ICD-10-CM | POA: Diagnosis not present

## 2020-06-04 DIAGNOSIS — M25562 Pain in left knee: Secondary | ICD-10-CM | POA: Diagnosis not present

## 2020-06-07 DIAGNOSIS — M25562 Pain in left knee: Secondary | ICD-10-CM | POA: Diagnosis not present

## 2020-06-07 DIAGNOSIS — M25662 Stiffness of left knee, not elsewhere classified: Secondary | ICD-10-CM | POA: Diagnosis not present

## 2020-06-12 DIAGNOSIS — M25662 Stiffness of left knee, not elsewhere classified: Secondary | ICD-10-CM | POA: Diagnosis not present

## 2020-06-12 DIAGNOSIS — M25562 Pain in left knee: Secondary | ICD-10-CM | POA: Diagnosis not present

## 2020-06-18 DIAGNOSIS — M25562 Pain in left knee: Secondary | ICD-10-CM | POA: Diagnosis not present

## 2020-06-18 DIAGNOSIS — M25662 Stiffness of left knee, not elsewhere classified: Secondary | ICD-10-CM | POA: Diagnosis not present

## 2020-06-20 DIAGNOSIS — M25562 Pain in left knee: Secondary | ICD-10-CM | POA: Diagnosis not present

## 2020-06-20 DIAGNOSIS — M25662 Stiffness of left knee, not elsewhere classified: Secondary | ICD-10-CM | POA: Diagnosis not present

## 2020-06-25 DIAGNOSIS — M25562 Pain in left knee: Secondary | ICD-10-CM | POA: Diagnosis not present

## 2020-06-25 DIAGNOSIS — M25662 Stiffness of left knee, not elsewhere classified: Secondary | ICD-10-CM | POA: Diagnosis not present

## 2020-06-27 DIAGNOSIS — M25562 Pain in left knee: Secondary | ICD-10-CM | POA: Diagnosis not present

## 2020-06-27 DIAGNOSIS — M25662 Stiffness of left knee, not elsewhere classified: Secondary | ICD-10-CM | POA: Diagnosis not present

## 2020-07-02 DIAGNOSIS — Z96652 Presence of left artificial knee joint: Secondary | ICD-10-CM | POA: Diagnosis not present

## 2020-07-02 DIAGNOSIS — M25662 Stiffness of left knee, not elsewhere classified: Secondary | ICD-10-CM | POA: Diagnosis not present

## 2020-07-02 DIAGNOSIS — M25562 Pain in left knee: Secondary | ICD-10-CM | POA: Diagnosis not present

## 2020-07-04 DIAGNOSIS — M25662 Stiffness of left knee, not elsewhere classified: Secondary | ICD-10-CM | POA: Diagnosis not present

## 2020-07-04 DIAGNOSIS — M25562 Pain in left knee: Secondary | ICD-10-CM | POA: Diagnosis not present

## 2020-07-09 DIAGNOSIS — M25562 Pain in left knee: Secondary | ICD-10-CM | POA: Diagnosis not present

## 2020-07-09 DIAGNOSIS — M25662 Stiffness of left knee, not elsewhere classified: Secondary | ICD-10-CM | POA: Diagnosis not present

## 2020-07-11 DIAGNOSIS — M25562 Pain in left knee: Secondary | ICD-10-CM | POA: Diagnosis not present

## 2020-07-11 DIAGNOSIS — M25662 Stiffness of left knee, not elsewhere classified: Secondary | ICD-10-CM | POA: Diagnosis not present

## 2020-07-16 DIAGNOSIS — M25562 Pain in left knee: Secondary | ICD-10-CM | POA: Diagnosis not present

## 2020-07-16 DIAGNOSIS — M25662 Stiffness of left knee, not elsewhere classified: Secondary | ICD-10-CM | POA: Diagnosis not present

## 2020-07-18 DIAGNOSIS — M25562 Pain in left knee: Secondary | ICD-10-CM | POA: Diagnosis not present

## 2020-07-18 DIAGNOSIS — M25662 Stiffness of left knee, not elsewhere classified: Secondary | ICD-10-CM | POA: Diagnosis not present

## 2020-07-23 DIAGNOSIS — M25662 Stiffness of left knee, not elsewhere classified: Secondary | ICD-10-CM | POA: Diagnosis not present

## 2020-07-23 DIAGNOSIS — M25562 Pain in left knee: Secondary | ICD-10-CM | POA: Diagnosis not present

## 2020-07-25 DIAGNOSIS — M25562 Pain in left knee: Secondary | ICD-10-CM | POA: Diagnosis not present

## 2020-07-25 DIAGNOSIS — M25662 Stiffness of left knee, not elsewhere classified: Secondary | ICD-10-CM | POA: Diagnosis not present

## 2020-07-30 DIAGNOSIS — M25562 Pain in left knee: Secondary | ICD-10-CM | POA: Diagnosis not present

## 2020-07-30 DIAGNOSIS — M25662 Stiffness of left knee, not elsewhere classified: Secondary | ICD-10-CM | POA: Diagnosis not present

## 2020-08-01 DIAGNOSIS — M25662 Stiffness of left knee, not elsewhere classified: Secondary | ICD-10-CM | POA: Diagnosis not present

## 2020-08-01 DIAGNOSIS — M25562 Pain in left knee: Secondary | ICD-10-CM | POA: Diagnosis not present

## 2020-08-06 DIAGNOSIS — Z79899 Other long term (current) drug therapy: Secondary | ICD-10-CM | POA: Diagnosis not present

## 2020-08-06 DIAGNOSIS — E291 Testicular hypofunction: Secondary | ICD-10-CM | POA: Diagnosis not present

## 2020-08-07 DIAGNOSIS — Z0189 Encounter for other specified special examinations: Secondary | ICD-10-CM | POA: Diagnosis not present

## 2020-08-08 DIAGNOSIS — M25662 Stiffness of left knee, not elsewhere classified: Secondary | ICD-10-CM | POA: Diagnosis not present

## 2020-08-08 DIAGNOSIS — M25562 Pain in left knee: Secondary | ICD-10-CM | POA: Diagnosis not present

## 2020-09-25 ENCOUNTER — Other Ambulatory Visit: Payer: Self-pay | Admitting: Cardiovascular Disease

## 2021-01-09 ENCOUNTER — Other Ambulatory Visit: Payer: Self-pay | Admitting: Cardiovascular Disease

## 2021-01-09 MED ORDER — XARELTO 2.5 MG PO TABS: ORAL_TABLET | ORAL | 0 refills | Status: DC

## 2021-01-09 NOTE — Telephone Encounter (Signed)
Pt last saw Dr Rockey Situ 04/09/20, last labs 03/18/20 Creat 1.38, age 60, weight 81.2kg, CrCl 66.2.  Pt is on low dose Xarelto 2.5mg  BID for long term prevention of CV events in chronic CAD.  Will refill rx.

## 2021-01-09 NOTE — Telephone Encounter (Signed)
Prescription refill request for Xarelto received.  Indication: CAD Last office visit: 03/18/20  Johnny Bridge MD Weight: 81.2kg Age: 60 Scr: 1.38 on 03/18/20 CrCl: 66.20  Based on above findings Xarelto 2.5mg  twice daily is the appropriate dose.  Refill approved.  Pt is due lab work.  He is to f/u with Dr Rockey Situ 9/22.  Requested CBC/BMP to be done at that time.

## 2021-01-09 NOTE — Telephone Encounter (Signed)
Refill Request.  

## 2021-02-06 DIAGNOSIS — Z79899 Other long term (current) drug therapy: Secondary | ICD-10-CM | POA: Diagnosis not present

## 2021-02-06 DIAGNOSIS — E291 Testicular hypofunction: Secondary | ICD-10-CM | POA: Diagnosis not present

## 2021-02-27 ENCOUNTER — Encounter: Payer: Self-pay | Admitting: Licensed Clinical Social Worker

## 2021-02-27 NOTE — Progress Notes (Signed)
UPDATE: VUS [id number]  has been reclassified to "Likely Benign." The report date is 02/27/2021.

## 2021-03-14 ENCOUNTER — Telehealth: Payer: Self-pay | Admitting: Cardiovascular Disease

## 2021-03-14 DIAGNOSIS — U071 COVID-19: Secondary | ICD-10-CM | POA: Diagnosis not present

## 2021-03-14 NOTE — Telephone Encounter (Signed)
Paxlovid is contraindicated with Xarelto, increases concentrations of his rosuvastatin. He takes low dose Xarelto for his CAD, s/p NSTEMI and CABG in 07/2015.  Recommend he stop Xarelto and rosuvastatin during his 5 day course of Paxlovid due to drug interactions, then resume both meds after.

## 2021-03-14 NOTE — Telephone Encounter (Signed)
Pt c/o medication issue:  1. Name of Medication: paxlovid  2. How are you currently taking this medication (dosage and times per day)? Please advise if ok   3. Are you having a reaction (difficulty breathing--STAT)? no  4. What is your medication issue? Patient  given paxlovid for covid and was told to call and consult his cardio md before starting as he is on xarelto and rosuvastatin   Please call

## 2021-03-14 NOTE — Telephone Encounter (Signed)
Was able to reach out to Jeffrey Spence regarding starting paxlovid, advised on pharmacy's recommendations of  Paxlovid is contraindicated with Xarelto, increases concentrations of his rosuvastatin. He takes low dose Xarelto for his CAD, s/p NSTEMI and CABG in 07/2015.   Recommend he stop Xarelto and rosuvastatin during his 5 day course of Paxlovid due to drug interactions, then resume both meds after.   Jeffrey Spence very thankful for the advice, will follow recommendations, and will call back with any further concerns or questions.

## 2021-04-11 ENCOUNTER — Encounter: Payer: Self-pay | Admitting: Cardiovascular Disease

## 2021-04-11 ENCOUNTER — Other Ambulatory Visit: Payer: Self-pay

## 2021-04-11 ENCOUNTER — Ambulatory Visit: Payer: BC Managed Care – PPO | Admitting: Cardiovascular Disease

## 2021-04-11 VITALS — BP 112/80 | HR 75 | Ht 68.0 in | Wt 183.2 lb

## 2021-04-11 DIAGNOSIS — R42 Dizziness and giddiness: Secondary | ICD-10-CM

## 2021-04-11 DIAGNOSIS — Z79899 Other long term (current) drug therapy: Secondary | ICD-10-CM

## 2021-04-11 DIAGNOSIS — Z951 Presence of aortocoronary bypass graft: Secondary | ICD-10-CM | POA: Diagnosis not present

## 2021-04-11 DIAGNOSIS — I25110 Atherosclerotic heart disease of native coronary artery with unstable angina pectoris: Secondary | ICD-10-CM

## 2021-04-11 DIAGNOSIS — E782 Mixed hyperlipidemia: Secondary | ICD-10-CM | POA: Diagnosis not present

## 2021-04-11 MED ORDER — METOPROLOL TARTRATE 25 MG PO TABS: ORAL_TABLET | ORAL | 3 refills | Status: DC

## 2021-04-11 MED ORDER — XARELTO 2.5 MG PO TABS: ORAL_TABLET | ORAL | 3 refills | Status: DC

## 2021-04-11 MED ORDER — ROSUVASTATIN CALCIUM 20 MG PO TABS: ORAL_TABLET | ORAL | 2 refills | Status: DC

## 2021-04-11 NOTE — Addendum Note (Signed)
Addended by: Wynema Birch on: 04/11/2021 09:55 AM   Modules accepted: Orders

## 2021-04-11 NOTE — Patient Instructions (Addendum)
Medication Instructions:  No changes  If you need a refill on your cardiac medications before your next appointment, please call your pharmacy.    Lab work: No new labs needed  Have labs from work sent to our office for review    Fax [phone] Goal LDL <60 If elevated on labs at work We can add zetia 10 mg daily  Testing/Procedures: No new testing needed  Follow-Up: At [facility], you and your health needs are our priority.  As part of our continuing mission to provide you with exceptional heart care, we have created designated Provider Care Teams.  These Care Teams include your primary Cardiologist (physician) and Advanced Practice Providers (APPs -  Physician Assistants and Nurse Practitioners) who all work together to provide you with the care you need, when you need it.  You will need a follow up appointment in 12 months  Providers on your designated Care Team:   Murray Hodgkins, NP Christell Faith, PA-C Marrianne Mood, PA-C [place], [place]  COVID-19 Vaccine Information can be found at: [email or web address].uk For questions related to vaccine distribution or appointments, please email [email or web address] or call [phone].

## 2021-04-11 NOTE — Progress Notes (Signed)
Date:  04/11/2021   ID:  [name], DOB 05-Feb-1961, MRN [id number]  Patient Location:  [place] [address]   Provider location:   [facility], [place] office  PCP:  Patient, No Pcp Per (Inactive)  Cardiologist:  None    Chief Complaint  Patient presents with   12 month follow up     "Doing well." Medications reviewed by the patient verbally.     History of Present Illness:    Jeffrey Spence is a 60 y.o. 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 Having knee trouble on the right, had prior surgery on the left knee  On Xarelto 2.5 twice daily with low-dose aspirin for PAD Scheduled to have lab work done through work, does not want lab work done today as it would be redundant  No leg swelling, no PND orthopnea    No recent trips to decide emergency room 03/19/2020 for dizziness Work-up unrevealing, possibly dehydration  Lab work reviewed Free testosterone level 6.4 Testosterone 190 Total cholesterol 133, LDL 77 HDL 42 TSH 1.9 Elevated alkaline phosphatase, normal AST ALT  EKG personally reviewed by myself on todays visit Shows normal sinus rhythm rate 68 bpm no significant ST-T wave changes   Prior CV studies:   The following studies were reviewed today:  Cardiac catheterization report 80-90% ostial left main disease that did not improve with nitroglycerin IC 80% proximal LAD disease, long region with moderate calcification 80-90% proximal RCA disease, calcified, ulcerative plaque 70% proximal left circumflex disease Essentially normal LV gram, ejection fraction  greater than 55 %  underwent coronary bypass grafting 3 with bilateral mammaries and a left radial artery on 07/25/2015. Postoperatively he had atrial fibrillation. He converted to sinus rhythm with amiodarone. He did not require anticoagulation.    Past Medical History:  Diagnosis Date   Arthritis    Blood transfusion without reported diagnosis 2017   triple by pass   CAD (coronary artery disease)    a. nstemi 1.2017; b. cardiac cath 06/2015 with LM and severe 3 veseel dz (full report pending)   Family history of leukemia    HLD (hyperlipidemia)    pt. denies   NSTEMI (non-ST elevated myocardial infarction) ([place]) 06/2015   Personal history of colonic polyps    Postoperative atrial fibrillation ([place]) 07/01/2017   Tobacco abuse    Past Surgical History:  Procedure Laterality Date   CARDIAC CATHETERIZATION Bilateral 07/23/2015   Procedure: Left Heart Cath and Coronary Angiography;  Surgeon: Minna Merritts, MD;  Location: [facility];  Service: Cardiovascular;  Laterality: Bilateral;   CORONARY ARTERY BYPASS GRAFT N/A 07/25/2015   Procedure: CORONARY ARTERY BYPASS GRAFTING X 3 UTILIZING BILATERAL IMA AND LEFT RADIAL ARTERY;  Surgeon: Melrose Nakayama, MD;  Location: [facility];  Service: Open Heart Surgery;  Laterality: N/A;   fractured toe Right 2013   great toe/with pin   RADIAL ARTERY HARVEST Left 07/25/2015   Procedure: RADIAL ARTERY HARVEST;  Surgeon: Melrose Nakayama, MD;  Location: [facility];  Service: Open Heart Surgery;  Laterality: Left;  TEE WITHOUT CARDIOVERSION N/A 07/25/2015   Procedure: TRANSESOPHAGEAL ECHOCARDIOGRAM (TEE);  Surgeon: Melrose Nakayama, MD;  Location: [facility];  Service: Open Heart Surgery;  Laterality: N/A;     Current Outpatient Medications on File Prior to Visit  Medication Sig Dispense Refill   aspirin 81 MG tablet Take 1 tablet (81 mg total) by mouth daily.     celecoxib (CELEBREX) 200 MG capsule TAKE 1 CAPSULE(200 MG) BY MOUTH DAILY 30 capsule  2   cetirizine (ZYRTEC) 10 MG tablet Take 10 mg by mouth daily as needed.     Cholecalciferol (VITAMIN D3) 50 MCG (2000 UT) capsule Take 2,000 Units by mouth daily.     Cyanocobalamin (B-12 PO) Take by mouth daily.      diclofenac sodium (VOLTAREN) 1 % GEL Apply 2 g topically daily as needed.     famotidine (PEPCID) 10 MG tablet Take 10 mg by mouth daily.     JATENZO 237 MG CAPS Take 1 capsule by mouth 2 (two) times daily.     metoprolol tartrate (LOPRESSOR) 25 MG tablet TAKE 1 TABLET(25 MG) BY MOUTH TWICE DAILY 180 tablet 2   Multiple Vitamins-Minerals (MULTIVITAMIN WITH MINERALS) tablet Take 1 tablet by mouth daily.     rivaroxaban (XARELTO) 2.5 MG TABS tablet TAKE 1 TABLET(2.5 MG) BY MOUTH TWICE DAILY 180 tablet 0   rosuvastatin (CRESTOR) 20 MG tablet TAKE 1 TABLET(20 MG) BY MOUTH DAILY 90 tablet 2   testosterone (ANDRODERM) 4 MG/24HR PT24 patch Place 1 patch onto the skin daily.     fluticasone (FLONASE) 50 MCG/ACT nasal spray Place 2 sprays into both nostrils daily. In each nostril (Patient not taking: Reported on 04/11/2021) 16 g 12   QUNOL COQ10/UBIQUINOL/MEGA PO Take 1,000 mg by mouth daily. (Patient not taking: Reported on 04/11/2021)     No current facility-administered medications on file prior to visit.    Allergies:   Patient has no known allergies.   Social History   Tobacco Use   Smoking status: Former    Packs/day: 1.00    Years: 30.00    Pack years: 30.00    Types: Cigarettes    Quit date: 07/17/2015    Years since quitting: 5.7   Smokeless tobacco: Never  Vaping Use   Vaping Use: Never used  Substance Use Topics   Alcohol use: Yes    Alcohol/week: 0.0 standard drinks    Comment: rare   Drug use: No     Family Hx: The patient's family history includes Cancer in his daughter and father; Heart attack (age of onset: 53) in his father; Leukemia in his paternal uncle; Rheum arthritis in his mother. There is no history of Colon cancer.  ROS:   Please see the  history of present illness.    Review of Systems  Constitutional:  Positive for malaise/fatigue.  Respiratory: Negative.    Cardiovascular: Negative.   Gastrointestinal: Negative.   Musculoskeletal: Negative.   Neurological: Negative.   Psychiatric/Behavioral: Negative.    All other systems reviewed and are negative.   Labs/Other Tests and Data Reviewed:    Recent Labs: No results found for requested labs within last 8760 hours.   Recent Lipid Panel Lab Results  Component Value Date/Time   CHOL 133 09/05/2018 09:20 AM   TRIG 68 09/05/2018 09:20 AM   HDL 42 09/05/2018 09:20 AM   CHOLHDL 3.2 09/05/2018 09:20 AM   CHOLHDL 3.5 08/09/2017 09:58 AM   LDLCALC 77 09/05/2018 09:20 AM   LDLDIRECT 140.9  11/15/2012 08:39 AM    Wt Readings from Last 3 Encounters:  04/11/21 183 lb 4 oz (83.1 kg)  04/09/20 179 lb (81.2 kg)  03/18/20 180 lb (81.6 kg)     Exam:    Vital Signs:   BP 112/80 (BP Location: Left Arm, Patient Position: Sitting, Cuff Size: Normal)   Pulse 75   Ht 5\' 8"  (1.727 m)   Wt 183 lb 4 oz (83.1 kg)   SpO2 98%   BMI 27.86 kg/m  Constitutional:  oriented to person, place, and time. No distress.  HENT:  Head: Grossly normal Eyes:  no discharge. No scleral icterus.  Neck: No JVD, no carotid bruits  Cardiovascular: Regular rate and rhythm, no murmurs appreciated Pulmonary/Chest: Clear to auscultation bilaterally, no wheezes or rails Abdominal: Soft.  no distension.  no tenderness.  Musculoskeletal: Normal range of motion Neurological:  normal muscle tone. Coordination normal. No atrophy Skin: Skin warm and dry Psychiatric: normal affect, pleasant  ASSESSMENT & PLAN:    Coronary artery disease involving native coronary artery of native heart with unstable angina pectoris (HCC) Currently with no symptoms of angina. No further workup at this time. Continue current medication regimen.  Lightheadedness No recent episodes, remote for 2 weeks  S/P CABG x 3 No  further testing needed On low-dose aspirin with low-dose Xarelto, labs done through work, he will fax [place] the results  Mixed hyperlipidemia Will have labs done through primary care  Chronic fatigue Working long hours in a factory Active at baseline   Total encounter time more than 25 minutes  Greater than 50% was spent in counseling and coordination of care with the patient   Signed, Esmond Plants, MD, Ph.D [facility]

## 2021-04-15 DIAGNOSIS — Z0189 Encounter for other specified special examinations: Secondary | ICD-10-CM | POA: Diagnosis not present

## 2021-04-15 DIAGNOSIS — R55 Syncope and collapse: Secondary | ICD-10-CM | POA: Diagnosis not present

## 2021-04-28 ENCOUNTER — Other Ambulatory Visit: Payer: Self-pay

## 2021-04-28 MED ORDER — EZETIMIBE 10 MG PO TABS: 10.0000 mg | ORAL_TABLET | Freq: Every day | ORAL | 3 refills | Status: DC

## 2021-07-16 ENCOUNTER — Encounter: Payer: Self-pay | Admitting: Gastroenterology

## 2021-08-20 DIAGNOSIS — E291 Testicular hypofunction: Secondary | ICD-10-CM | POA: Diagnosis not present

## 2021-08-20 DIAGNOSIS — Z79899 Other long term (current) drug therapy: Secondary | ICD-10-CM | POA: Diagnosis not present

## 2021-12-05 ENCOUNTER — Ambulatory Visit: Payer: BC Managed Care – PPO | Admitting: Family

## 2021-12-22 ENCOUNTER — Ambulatory Visit: Payer: BC Managed Care – PPO | Admitting: Cardiovascular Disease

## 2021-12-24 ENCOUNTER — Encounter: Payer: Self-pay | Admitting: Cardiovascular Disease

## 2022-01-22 ENCOUNTER — Encounter: Payer: Self-pay | Admitting: Family

## 2022-01-22 ENCOUNTER — Ambulatory Visit: Payer: BC Managed Care – PPO | Admitting: Family

## 2022-01-22 VITALS — BP 130/76 | HR 84 | Temp 98.1°F | Resp 16 | Ht 68.5 in | Wt 181.4 lb

## 2022-01-22 DIAGNOSIS — E782 Mixed hyperlipidemia: Secondary | ICD-10-CM

## 2022-01-22 DIAGNOSIS — F17200 Nicotine dependence, unspecified, uncomplicated: Secondary | ICD-10-CM | POA: Diagnosis not present

## 2022-01-22 DIAGNOSIS — J309 Allergic rhinitis, unspecified: Secondary | ICD-10-CM

## 2022-01-22 DIAGNOSIS — Z1211 Encounter for screening for malignant neoplasm of colon: Secondary | ICD-10-CM

## 2022-01-22 DIAGNOSIS — Z7689 Persons encountering health services in other specified circumstances: Secondary | ICD-10-CM

## 2022-01-22 DIAGNOSIS — Z86010 Personal history of colonic polyps: Secondary | ICD-10-CM

## 2022-01-22 DIAGNOSIS — I4891 Unspecified atrial fibrillation: Secondary | ICD-10-CM

## 2022-01-22 DIAGNOSIS — R7989 Other specified abnormal findings of blood chemistry: Secondary | ICD-10-CM

## 2022-01-22 DIAGNOSIS — I214 Non-ST elevation (NSTEMI) myocardial infarction: Secondary | ICD-10-CM

## 2022-01-22 DIAGNOSIS — R5383 Other fatigue: Secondary | ICD-10-CM

## 2022-01-22 DIAGNOSIS — I9789 Other postprocedural complications and disorders of the circulatory system, not elsewhere classified: Secondary | ICD-10-CM

## 2022-01-22 DIAGNOSIS — L409 Psoriasis, unspecified: Secondary | ICD-10-CM

## 2022-01-22 MED ORDER — CETIRIZINE HCL 10 MG PO TABS: 10.0000 mg | ORAL_TABLET | Freq: Every day | ORAL | 1 refills | Status: DC | PRN

## 2022-01-22 MED ORDER — CLOBETASOL PROPIONATE 0.05 % EX CREA: 1.0000 | TOPICAL_CREAM | Freq: Two times a day (BID) | CUTANEOUS | 0 refills | Status: AC

## 2022-01-22 NOTE — Patient Instructions (Addendum)
Lab work preferred:    Psa   Testosterone  TSH e  B12  Cbc  Cmp   Lipid panel   A referral was placed today for GI to repeat colonoscopy.  Please let us know if you have not heard back within 2 weeks about the referral.  A referral was placed today for lung cancer screening.  Please let us know if you have not heard back within 2 weeks about the referral.  A referral was placed today for urology.  Please let us know if you have not heard back within 2 weeks about the referral.  Recommend shingles vaccination.   Welcome to our clinic, I am happy to have you as my new patient. I am excited to continue on this healthcare journey with you.  Stop by the lab prior to leaving today. I will notify you of your results once received.   Please keep in mind Any my chart messages you send have up to a three business day turnaround for a response.  Phone calls may take up to a one full business day turnaround for a  response.   If you need a medication refill I recommend you request it through the pharmacy as this is easiest for [place] rather than sending a message and or phone call.   Due to recent changes in healthcare laws, you may see results of your imaging and/or laboratory studies on MyChart before I have had a chance to review them.  I understand that in some cases there may be results that are confusing or concerning to you. Please understand that not all results are received at the same time and often I may need to interpret multiple results in order to provide you with the best plan of care or course of treatment. Therefore, I ask that you please give me 2 business days to thoroughly review all your results before contacting my office for clarification. Should we see a critical lab result, you will be contacted sooner.   It was a pleasure seeing you today! Please do not hesitate to reach out with any questions and or concerns.  Regards,   [name] FNP-C

## 2022-01-22 NOTE — Progress Notes (Signed)
Established Patient Office Visit  Subjective:  Patient ID: Jeffrey Spence, male    DOB: 1961/03/30  Age: 61 y.o. MRN: [id number]  CC:  Chief Complaint  Patient presents with   Establish Care    HPI Jeffrey Spence is here for a transition of care visit.  Prior provider was: Dr. Ave Filter  Pt is without acute concerns.   chronic concerns:  Heartburn: famotidine 10 mg takes daily   Low testosterone, very low in the past. Did see Dr. Eliberto Ivory a few years ago for this, about one year ago, and was on jatenzo. Needs to get back on this. Often with fatigue. No issues with ED. No loss of libido.   NSTEMI, CAD: back in 2017, resulting in bypass , as a result is on metoprolol 25 mg once daily, rosuvastatin, xalrelto 2.5 and also baby asa 81 mg.   HLD: rosuvastatin 20 mg once daily. Tolerating well. Working on [employer]. Pt states recent lab work, he will bring in a copy from work for my review.   Past Medical History:  Diagnosis Date   Arthritis    Blood transfusion without reported diagnosis 2017   triple by pass   CAD (coronary artery disease)    a. nstemi 1.2017; b. cardiac cath 06/2015 with LM and severe 3 veseel dz (full report pending)   Depression    Family history of leukemia    Heart disease    History of chicken pox    HLD (hyperlipidemia)    pt. denies   NSTEMI (non-ST elevated myocardial infarction) ([place]) 06/2015   Personal history of colonic polyps    Postoperative atrial fibrillation ([place]) 07/01/2017   Tobacco abuse     Past Surgical History:  Procedure Laterality Date   CARDIAC CATHETERIZATION Bilateral 07/23/2015   Procedure: Left Heart Cath and Coronary Angiography;  Surgeon: Minna Merritts, MD;  Location: [facility];  Service: Cardiovascular;  Laterality: Bilateral;   CORONARY ARTERY BYPASS GRAFT N/A 07/25/2015   Procedure: CORONARY ARTERY BYPASS GRAFTING X 3 UTILIZING BILATERAL IMA AND LEFT RADIAL ARTERY;  Surgeon: Melrose Nakayama, MD;   Location: [facility];  Service: Open Heart Surgery;  Laterality: N/A;   fractured toe Right 06/23/2011   great toe/with pin, pin has been removed   RADIAL ARTERY HARVEST Left 07/25/2015   Procedure: RADIAL ARTERY HARVEST;  Surgeon: Melrose Nakayama, MD;  Location: [facility];  Service: Open Heart Surgery;  Laterality: Left;   TEE WITHOUT CARDIOVERSION N/A 07/25/2015   Procedure: TRANSESOPHAGEAL ECHOCARDIOGRAM (TEE);  Surgeon: Melrose Nakayama, MD;  Location: [facility];  Service: Open Heart Surgery;  Laterality: N/A;    Family History  Problem Relation Age of Onset   Rheum arthritis Mother    Heart attack Father 35   Myelodysplastic syndrome Father    Cancer - Other Daughter        Vulvar (d. 18)   Leukemia Paternal Uncle    Colon cancer Neg Hx     Social History   Socioeconomic History   Marital status: Married    Spouse name: Not on file   Number of children: 7   Years of education: Not on file   Highest education level: Not on file  Occupational History    Employer: [employer]    Comment: Astronomer, Mining engineer  Tobacco Use   Smoking status: Some Days    Packs/day: 1.00    Years: 30.00    Total pack years:  30.00    Types: Cigarettes    Last attempt to quit: 07/17/2015    Years since quitting: 6.5   Smokeless tobacco: Never  Vaping Use   Vaping Use: Never used  Substance and Sexual Activity   Alcohol use: Yes    Alcohol/week: 0.0 standard drinks of alcohol    Comment: rare   Drug use: No   Sexual activity: Yes    Partners: Female    Birth control/protection: None  Other Topics Concern   Not on file  Social History Narrative   Regular exercise--yes   Seven children    Oldest daughter passed away age 8   grandchildren   Social Determinants of Health   Financial Resource Strain: Not on file  Food Insecurity: Not on file  Transportation Needs: Not on file  Physical Activity: Not on file  Stress: Not on file  Social Connections: Not on file  Intimate  Partner Violence: Not on file    Outpatient Medications Prior to Visit  Medication Sig Dispense Refill   aspirin 81 MG tablet Take 1 tablet (81 mg total) by mouth daily.     Cholecalciferol (VITAMIN D3) 50 MCG (2000 UT) capsule Take 2,000 Units by mouth daily.     Cyanocobalamin (B-12) 3000 MCG CAPS Take by mouth.     famotidine (PEPCID) 10 MG tablet Take 10 mg by mouth daily.     metoprolol tartrate (LOPRESSOR) 25 MG tablet TAKE 1 TABLET(25 MG) BY MOUTH TWICE DAILY 180 tablet 3   Multiple Vitamins-Minerals (MULTIVITAMIN WITH MINERALS) tablet Take 1 tablet by mouth daily.     rivaroxaban (XARELTO) 2.5 MG TABS tablet TAKE 1 TABLET(2.5 MG) BY MOUTH TWICE DAILY 180 tablet 3   rosuvastatin (CRESTOR) 20 MG tablet TAKE 1 TABLET(20 MG) BY MOUTH DAILY 90 tablet 2   cetirizine (ZYRTEC) 10 MG tablet Take 10 mg by mouth daily as needed.     JATENZO 237 MG CAPS Take 1 capsule by mouth 2 (two) times daily.     QUNOL COQ10/UBIQUINOL/MEGA PO Take 1,000 mg by mouth daily.     ezetimibe (ZETIA) 10 MG tablet Take 1 tablet (10 mg total) by mouth daily. 90 tablet 3   celecoxib (CELEBREX) 200 MG capsule TAKE 1 CAPSULE(200 MG) BY MOUTH DAILY (Patient not taking: Reported on 01/22/2022) 30 capsule 2   Cyanocobalamin (B-12 PO) Take by mouth daily.  (Patient not taking: Reported on 01/22/2022)     diclofenac sodium (VOLTAREN) 1 % GEL Apply 2 g topically daily as needed. (Patient not taking: Reported on 01/22/2022)     fluticasone (FLONASE) 50 MCG/ACT nasal spray Place 2 sprays into both nostrils daily. In each nostril (Patient not taking: Reported on 04/11/2021) 16 g 12   testosterone (ANDRODERM) 4 MG/24HR PT24 patch Place 1 patch onto the skin daily. (Patient not taking: Reported on 01/22/2022)     Testosterone Undecanoate (JATENZO) 198 MG CAPS Take 1 capsule by mouth 2 (two) times daily.     No facility-administered medications prior to visit.    No Known Allergies      Objective:    Physical  Exam Constitutional:      General: He is not in acute distress.    Appearance: Normal appearance. He is normal weight. He is not ill-appearing, toxic-appearing or diaphoretic.  HENT:     Head: Normocephalic.     Right Ear: Tympanic membrane normal.     Left Ear: Tympanic membrane normal.     Nose: Nose normal.  Eyes:  Pupils: Pupils are equal, round, and reactive to light.  Cardiovascular:     Rate and Rhythm: Normal rate and regular rhythm.  Pulmonary:     Effort: Pulmonary effort is normal.     Breath sounds: Normal breath sounds.  Abdominal:     General: Abdomen is flat. Bowel sounds are normal.     Palpations: Abdomen is soft.     Tenderness: There is no abdominal tenderness.  Musculoskeletal:        General: Normal range of motion.     Cervical back: Normal range of motion.  Skin:    General: Skin is warm.     Comments: Dry scaly skin surrounding bil ear canal and ear auricle  Neurological:     General: No focal deficit present.     Mental Status: He is alert and oriented to person, place, and time.     Motor: No weakness.     Gait: Gait normal.  Psychiatric:        Mood and Affect: Mood normal.        Behavior: Behavior normal.        Thought Content: Thought content normal.        Judgment: Judgment normal.       BP 130/76   Pulse 84   Temp 98.1 F (36.7 C)   Resp 16   Ht 5' 8.5" (1.74 m)   Wt 181 lb 6 oz (82.3 kg)   SpO2 98%   BMI 27.18 kg/m  Wt Readings from Last 3 Encounters:  01/22/22 181 lb 6 oz (82.3 kg)  04/11/21 183 lb 4 oz (83.1 kg)  04/09/20 179 lb (81.2 kg)     Health Maintenance Due  Topic Date Due   COVID-19 Vaccine (1) Never done   Hepatitis C Screening  Never done   Zoster Vaccines- Shingrix (1 of 2) Never done   COLONOSCOPY (Pts 45-54yr Insurance coverage will need to be confirmed)  07/12/2021   INFLUENZA VACCINE  01/20/2022    There are no preventive care reminders to display for this patient.  Lab Results  Component  Value Date   TSH 1.960 09/05/2018   Lab Results  Component Value Date   WBC 7.5 03/18/2020   HGB 15.5 03/18/2020   HCT 46.0 03/18/2020   MCV 93.7 03/18/2020   PLT 324 03/18/2020   Lab Results  Component Value Date   NA 140 03/18/2020   K 3.2 (L) 03/18/2020   CO2 25 03/18/2020   GLUCOSE 119 (H) 03/18/2020   BUN 13 03/18/2020   CREATININE 1.38 (H) 03/18/2020   BILITOT <0.2 09/05/2018   ALKPHOS 136 (H) 09/05/2018   AST 22 09/05/2018   ALT 23 09/05/2018   PROT 6.4 09/05/2018   ALBUMIN 4.2 09/05/2018   CALCIUM 9.3 03/18/2020   ANIONGAP 11 03/18/2020   GFR 78.69 08/26/2016   Lab Results  Component Value Date   CHOL 133 09/05/2018   Lab Results  Component Value Date   HDL 42 09/05/2018   Lab Results  Component Value Date   LDLCALC 77 09/05/2018   Lab Results  Component Value Date   TRIG 68 09/05/2018   Lab Results  Component Value Date   CHOLHDL 3.2 09/05/2018   Lab Results  Component Value Date   HGBA1C 5.5 07/23/2015      Assessment & Plan:   Problem List Items Addressed This Visit       Cardiovascular and Mediastinum   NSTEMI (non-ST elevated myocardial infarction) (  [place])    Continue metroprolol rosuvastatin xarelto and baby aspirin as indicted. Continue f/u with cardiologist as scheduled.       Postoperative atrial fibrillation (HCC)    Continue metroprolol and xarelto.         Respiratory   Allergic rhinitis   Relevant Medications   cetirizine (ZYRTEC) 10 MG tablet     Musculoskeletal and Integument   Psoriasis    Clobetasol cream for ears suspected psoriasis      Relevant Medications   clobetasol cream (TEMOVATE) 0.05 %     Other   Low testosterone in male - Primary    Referring back to urology for chronic low testosterone  Pt states will wait to obtain labs until visit with urology       Relevant Orders   Ambulatory referral to Urology   TOBACCO ABUSE    Smoking cessation instruction/counseling given:  counseled patient on  the dangers of tobacco use, advised patient to stop smoking, and reviewed strategies to maximize success       Relevant Orders   Ambulatory Referral Lung Cancer Screening [facility]   DG Chest 2 View   Smoker    Referral to lung cancer screening program made.       Relevant Orders   Ambulatory Referral Lung Cancer Screening [facility]   DG Chest 2 View   Personal history of colonic polyps    Referred for colonoscopy        Relevant Orders   Ambulatory referral to Gastroenterology   Fatigue    Fatigue lab work recommended  Pt states will get lab work at work       Mixed hyperlipidemia    Continue rosuvastatin 20 mg  Work on low chol diet and exercise as tolerated      Screening for colon cancer    Referred to GI for colonoscopy.        Relevant Orders   Ambulatory referral to Gastroenterology   Encounter to establish care with new doctor    D/w with pt and went over chronic and acute history as indicated Went over family social and medical/surgical history  Discussed h/o lab results and or diagnostic testing as indicated         Meds ordered this encounter  Medications   cetirizine (ZYRTEC) 10 MG tablet    Sig: Take 1 tablet (10 mg total) by mouth daily as needed.    Dispense:  90 tablet    Refill:  1    Order Specific Question:   Supervising Provider    Answer:   BEDSOLE, AMY E [[id number]]   clobetasol cream (TEMOVATE) 0.05 %    Sig: Apply 1 Application topically 2 (two) times daily.    Dispense:  30 g    Refill:  0    Order Specific Question:   Supervising Provider    Answer:   Diona Browner, AMY E [[id number]]    Follow-up: Return in about 6 months (around 07/25/2022) for regular follow up appt.    [name], FNP

## 2022-01-23 DIAGNOSIS — Z7689 Persons encountering health services in other specified circumstances: Secondary | ICD-10-CM | POA: Insufficient documentation

## 2022-01-23 DIAGNOSIS — Z1211 Encounter for screening for malignant neoplasm of colon: Secondary | ICD-10-CM | POA: Insufficient documentation

## 2022-01-23 DIAGNOSIS — L409 Psoriasis, unspecified: Secondary | ICD-10-CM | POA: Insufficient documentation

## 2022-01-23 NOTE — Assessment & Plan Note (Signed)
Referred for colonoscopy

## 2022-01-23 NOTE — Assessment & Plan Note (Signed)
Continue metroprolol and xarelto.

## 2022-01-23 NOTE — Assessment & Plan Note (Signed)
Continue metroprolol rosuvastatin xarelto and baby aspirin as indicted. Continue f/u with cardiologist as scheduled.

## 2022-01-23 NOTE — Assessment & Plan Note (Signed)
Smoking cessation instruction/counseling given:  counseled patient on the dangers of tobacco use, advised patient to stop smoking, and reviewed strategies to maximize success 

## 2022-01-23 NOTE — Assessment & Plan Note (Addendum)
Fatigue lab work recommended  Pt states will get lab work at work

## 2022-01-23 NOTE — Assessment & Plan Note (Signed)
Clobetasol cream for ears suspected psoriasis

## 2022-01-23 NOTE — Assessment & Plan Note (Signed)
Referred to GI for colonoscopy 

## 2022-01-23 NOTE — Assessment & Plan Note (Signed)
Continue rosuvastatin 20 mg  Work on low chol diet and exercise as tolerated

## 2022-01-23 NOTE — Assessment & Plan Note (Signed)
Referring back to urology for chronic low testosterone  Pt states will wait to obtain labs until visit with urology

## 2022-01-23 NOTE — Assessment & Plan Note (Signed)
Referral to lung cancer screening program made.

## 2022-01-23 NOTE — Assessment & Plan Note (Signed)
D/w with pt and went over chronic and acute history as indicated Went over family social and medical/surgical history  Discussed h/o lab results and or diagnostic testing as indicated   

## 2022-01-28 ENCOUNTER — Encounter: Payer: Self-pay | Admitting: *Deleted

## 2022-02-03 ENCOUNTER — Other Ambulatory Visit: Payer: Self-pay | Admitting: Cardiovascular Disease

## 2022-02-24 DIAGNOSIS — Z0189 Encounter for other specified special examinations: Secondary | ICD-10-CM | POA: Diagnosis not present

## 2022-02-26 DIAGNOSIS — Z713 Dietary counseling and surveillance: Secondary | ICD-10-CM | POA: Diagnosis not present

## 2022-02-26 DIAGNOSIS — E785 Hyperlipidemia, unspecified: Secondary | ICD-10-CM | POA: Diagnosis not present

## 2022-02-26 DIAGNOSIS — Z043 Encounter for examination and observation following other accident: Secondary | ICD-10-CM | POA: Diagnosis not present

## 2022-03-09 ENCOUNTER — Encounter: Payer: Self-pay | Admitting: Urology

## 2022-03-09 ENCOUNTER — Ambulatory Visit: Payer: BC Managed Care – PPO | Admitting: Urology

## 2022-03-09 VITALS — BP 134/82 | HR 82 | Ht 68.5 in | Wt 185.0 lb

## 2022-03-09 DIAGNOSIS — E291 Testicular hypofunction: Secondary | ICD-10-CM

## 2022-03-09 NOTE — Progress Notes (Signed)
03/09/2022 12:37 PM   Jeffrey Spence 08/07/1960 [id number]  Referring provider: [name], [place],  [place] [address]  Chief Complaint  Patient presents with   Hypogonadism    HPI: Jeffrey Spence is a 61 y.o. male with hypogonadism presents for transfer of urologic care.  States he had been on TRT ~ 18 months prescribed by Dr. Yves Dill Initially on a topical gel.  Patient states adequate levels were not achieved and he had concerns regarding transference Was subsequently started on Jatenzo which was covered by his insurance for the first year.  He states prior authorization was requested for the second year and that Dr. Letta Kocher office declined obtaining PA Has been off replacement 9 months His most bothersome symptom was tiredness/fatigue Last labs 02/24/2022: Testosterone 202 ng/dL; elevated LFTs; H/H14.6/44.7 and PSA 2.1   PMH: Past Medical History:  Diagnosis Date   Arthritis    Blood transfusion without reported diagnosis 2017   triple by pass   CAD (coronary artery disease)    a. nstemi 1.2017; b. cardiac cath 06/2015 with LM and severe 3 veseel dz (full report pending)   Depression    Family history of leukemia    Heart disease    History of chicken pox    HLD (hyperlipidemia)    pt. denies   NSTEMI (non-ST elevated myocardial infarction) ([place]) 06/2015   Personal history of colonic polyps    Postoperative atrial fibrillation ([facility]) 07/01/2017   Tobacco abuse     Surgical History: Past Surgical History:  Procedure Laterality Date   CARDIAC CATHETERIZATION Bilateral 07/23/2015   Procedure: Left Heart Cath and Coronary Angiography;  Surgeon: Minna Merritts, MD;  Location: [facility];  Service: Cardiovascular;  Laterality: Bilateral;   CORONARY ARTERY BYPASS GRAFT N/A 07/25/2015   Procedure: CORONARY ARTERY BYPASS GRAFTING X 3 UTILIZING BILATERAL IMA AND LEFT RADIAL ARTERY;  Surgeon: Melrose Nakayama, MD;  Location: [facility];   Service: Open Heart Surgery;  Laterality: N/A;   fractured toe Right 06/23/2011   great toe/with pin, pin has been removed   RADIAL ARTERY HARVEST Left 07/25/2015   Procedure: RADIAL ARTERY HARVEST;  Surgeon: Melrose Nakayama, MD;  Location: [facility];  Service: Open Heart Surgery;  Laterality: Left;   TEE WITHOUT CARDIOVERSION N/A 07/25/2015   Procedure: TRANSESOPHAGEAL ECHOCARDIOGRAM (TEE);  Surgeon: Melrose Nakayama, MD;  Location: [facility];  Service: Open Heart Surgery;  Laterality: N/A;    Home Medications:  Allergies as of 03/09/2022   No Known Allergies      Medication List        Accurate as of March 09, 2022 12:37 PM. If you have any questions, ask your nurse or doctor.          aspirin EC 81 MG tablet Take 1 tablet (81 mg total) by mouth daily.   B-12 3000 MCG Caps Take by mouth.   cetirizine 10 MG tablet Commonly known as: ZYRTEC Take 1 tablet (10 mg total) by mouth daily as needed.   clobetasol cream 0.05 % Commonly known as: TEMOVATE Apply 1 Application topically 2 (two) times daily.   ezetimibe 10 MG tablet Commonly known as: ZETIA Take 1 tablet (10 mg total) by mouth daily.   famotidine 10 MG tablet Commonly known as: PEPCID Take 10 mg by mouth daily.   metoprolol tartrate 25 MG tablet Commonly known as: LOPRESSOR TAKE 1 TABLET(25 MG) BY MOUTH TWICE DAILY  multivitamin with minerals tablet Take 1 tablet by mouth daily.   rosuvastatin 20 MG tablet Commonly known as: CRESTOR TAKE 1 TABLET(20 MG) BY MOUTH DAILY PLEASE SCHEDULE APPOINTMENT FOR FURTHER REFILLS. THANK YOU!   Vitamin D3 50 MCG (2000 UT) capsule Take 2,000 Units by mouth daily.   Xarelto 2.5 MG Tabs tablet Generic drug: rivaroxaban TAKE 1 TABLET(2.5 MG) BY MOUTH TWICE DAILY        Allergies: No Known Allergies  Family History: Family History  Problem Relation Age of Onset   Rheum arthritis Mother    Heart attack Father 46   Myelodysplastic syndrome Father     Cancer - Other Daughter        Vulvar (d. 75)   Leukemia Paternal Uncle    Colon cancer Neg Hx     Social History:  reports that he has been smoking cigarettes. He has a 30.00 pack-year smoking history. He has never used smokeless tobacco. He reports current alcohol use. He reports that he does not use drugs.   Physical Exam: BP 134/82   Pulse 82   Ht 5' 8.5" (1.74 m)   Wt 185 lb (83.9 kg)   BMI 27.72 kg/m   Constitutional:  Alert and oriented, No acute distress. HEENT: [place] AT Respiratory: Normal respiratory effort, no increased work of breathing. Psychiatric: Normal mood and affect.   Assessment & Plan:    1.  Hypogonadism Release for record request was signed and will review once received If Jatenzo not approved we discussed other options including intramuscular testosterone injections, Xyosted I had an extensive discussion regarding testosterone replacement therapy including the following: Treatment may result in improvements in erectile function, low sex drive, anemia, bone mineral density, lean body mass, and depressive symptoms; evidence is inconclusive whether testosterone therapy improves cognitive function, measures of diabetes, energy, fatigue, lipid profiles, and quality of life measures; there is no conclusive evidence linking testosterone therapy to the development of prostate cancer; there is no definitive evidence linking testosterone therapy to a higher incidence of venothrombolic events; at the present time it cannot be stated definitively whether testosterone therapy increases or decreases the risk of cardiovascular events including myocardial infarction and stroke. Potential side effects were discussed including erythrocytosis, gynecomastia.  The need for regular monitoring of testosterone levels and hematocrit was discussed.   Abbie Sons, [place] [address] [place], [place] [address] [phone]

## 2022-03-11 ENCOUNTER — Encounter: Payer: Self-pay | Admitting: Family Medicine

## 2022-04-03 ENCOUNTER — Encounter: Payer: Self-pay | Admitting: Physician Assistant

## 2022-04-03 ENCOUNTER — Ambulatory Visit (INDEPENDENT_AMBULATORY_CARE_PROVIDER_SITE_OTHER): Payer: BC Managed Care – PPO | Admitting: Physician Assistant

## 2022-04-03 VITALS — BP 132/78 | HR 95 | Ht 68.0 in | Wt 183.0 lb

## 2022-04-03 DIAGNOSIS — Z7901 Long term (current) use of anticoagulants: Secondary | ICD-10-CM

## 2022-04-03 DIAGNOSIS — Z86010 Personal history of colonic polyps: Secondary | ICD-10-CM | POA: Diagnosis not present

## 2022-04-03 MED ORDER — NA SULFATE-K SULFATE-MG SULF 17.5-3.13-1.6 GM/177ML PO SOLN: 1.0000 | Freq: Once | ORAL | 0 refills | Status: AC

## 2022-04-03 NOTE — Progress Notes (Signed)
Chief Complaint: Discuss colonoscopy on Xarelto  HPI:    Jeffrey Spence is a 61 year old male, known to Dr. Fuller Plan, with a past medical history as listed below including CAD, NSTEMI, A-fib on Xarelto and depression, who was referred to me by [name], FNP to discuss surveillance colonoscopy on Xarelto.    07/12/2018 colonoscopy for surveillance of adenomatous polyps.  Findings of one 4 mm polyp in the ascending colon, four 6 to 8 mm polyps in the descending and transverse colon and internal hemorrhoids.  Pathology showed tubular adenomas and repeat recommended in 3 years.    Today, the patient tells me he is not having any problems at all he is just due for his next colonoscopy.  Still on Xarelto for his A-fib.    Denies fever, chills, weight loss, blood in his stool, change in bowel habits, heartburn or reflux.  Past Medical History:  Diagnosis Date   Arthritis    Blood transfusion without reported diagnosis 2017   triple by pass   CAD (coronary artery disease)    a. nstemi 1.2017; b. cardiac cath 06/2015 with LM and severe 3 veseel dz (full report pending)   Depression    Family history of leukemia    Heart disease    History of chicken pox    HLD (hyperlipidemia)    pt. denies   NSTEMI (non-ST elevated myocardial infarction) ([facility]) 06/2015   Personal history of colonic polyps    Postoperative atrial fibrillation ([facility]) 07/01/2017   Tobacco abuse     Past Surgical History:  Procedure Laterality Date   CARDIAC CATHETERIZATION Bilateral 07/23/2015   Procedure: Left Heart Cath and Coronary Angiography;  Surgeon: Minna Merritts, MD;  Location: [facility];  Service: Cardiovascular;  Laterality: Bilateral;   CORONARY ARTERY BYPASS GRAFT N/A 07/25/2015   Procedure: CORONARY ARTERY BYPASS GRAFTING X 3 UTILIZING BILATERAL IMA AND LEFT RADIAL ARTERY;  Surgeon: Melrose Nakayama, MD;  Location: [facility];  Service: Open Heart Surgery;  Laterality: N/A;   fractured toe Right  06/23/2011   great toe/with pin, pin has been removed   RADIAL ARTERY HARVEST Left 07/25/2015   Procedure: RADIAL ARTERY HARVEST;  Surgeon: Melrose Nakayama, MD;  Location: [facility];  Service: Open Heart Surgery;  Laterality: Left;   TEE WITHOUT CARDIOVERSION N/A 07/25/2015   Procedure: TRANSESOPHAGEAL ECHOCARDIOGRAM (TEE);  Surgeon: Melrose Nakayama, MD;  Location: [facility];  Service: Open Heart Surgery;  Laterality: N/A;    Current Outpatient Medications  Medication Sig Dispense Refill   aspirin 81 MG tablet Take 1 tablet (81 mg total) by mouth daily.     cetirizine (ZYRTEC) 10 MG tablet Take 1 tablet (10 mg total) by mouth daily as needed. 90 tablet 1   Cholecalciferol (VITAMIN D3) 50 MCG (2000 UT) capsule Take 2,000 Units by mouth daily.     clobetasol cream (TEMOVATE) 7.57 % Apply 1 Application topically 2 (two) times daily. 30 g 0   Cyanocobalamin (B-12) 3000 MCG CAPS Take by mouth.     ezetimibe (ZETIA) 10 MG tablet Take 1 tablet (10 mg total) by mouth daily. 90 tablet 3   famotidine (PEPCID) 10 MG tablet Take 10 mg by mouth daily.     metoprolol tartrate (LOPRESSOR) 25 MG tablet TAKE 1 TABLET(25 MG) BY MOUTH TWICE DAILY 180 tablet 3   Multiple Vitamins-Minerals (MULTIVITAMIN WITH MINERALS) tablet Take 1 tablet by mouth daily.     rivaroxaban (XARELTO) 2.5 MG TABS tablet TAKE 1  TABLET(2.5 MG) BY MOUTH TWICE DAILY 180 tablet 3   rosuvastatin (CRESTOR) 20 MG tablet TAKE 1 TABLET(20 MG) BY MOUTH DAILY PLEASE SCHEDULE APPOINTMENT FOR FURTHER REFILLS. THANK YOU! 90 tablet 0   No current facility-administered medications for this visit.    Allergies as of 04/03/2022   (No Known Allergies)    Family History  Problem Relation Age of Onset   Rheum arthritis Mother    Heart attack Father 50   Myelodysplastic syndrome Father    Cancer - Other Daughter        Vulvar (d. 7)   Leukemia Paternal Uncle    Colon cancer Neg Hx     Social History   Socioeconomic History   Marital  status: Married    Spouse name: Not on file   Number of children: 7   Years of education: Not on file   Highest education level: Not on file  Occupational History    Employer: [employer]    Comment: Astronomer, Mining engineer  Tobacco Use   Smoking status: Some Days    Packs/day: 1.00    Years: 30.00    Total pack years: 30.00    Types: Cigarettes    Last attempt to quit: 07/17/2015    Years since quitting: 6.7   Smokeless tobacco: Never  Vaping Use   Vaping Use: Never used  Substance and Sexual Activity   Alcohol use: Yes    Alcohol/week: 0.0 standard drinks of alcohol    Comment: rare   Drug use: No   Sexual activity: Yes    Partners: Female    Birth control/protection: None  Other Topics Concern   Not on file  Social History Narrative   Regular exercise--yes   Seven children    Oldest daughter passed away age 62   grandchildren   Social Determinants of Health   Financial Resource Strain: Not on file  Food Insecurity: Not on file  Transportation Needs: Not on file  Physical Activity: Not on file  Stress: Not on file  Social Connections: Not on file  Intimate Partner Violence: Not on file    Review of Systems:    Constitutional: No weight loss, fever or chills Skin: No rash Cardiovascular: No chest pain Respiratory: No SOB Gastrointestinal: See HPI and otherwise negative Genitourinary: No dysuria  Neurological: No headache, dizziness or syncope Musculoskeletal: No new muscle or joint pain Hematologic: No bleeding Psychiatric: No history of depression or anxiety   Physical Exam:  Vital signs: BP 132/78   Pulse 95   Ht '5\' 8"'$  (1.727 m)   Wt 183 lb (83 kg)   BMI 27.83 kg/m    Constitutional:   Pleasant Caucasian male appears to be in NAD, Well developed, Well nourished, alert and cooperative Head:  Normocephalic and atraumatic. Eyes:   PEERL, EOMI. No icterus. Conjunctiva pink. Ears:  Normal auditory acuity. Neck:  Supple Throat: Oral cavity and  pharynx without inflammation, swelling or lesion.  Respiratory: Respirations even and unlabored. Lungs clear to auscultation bilaterally.   No wheezes, crackles, or rhonchi.  Cardiovascular: Normal S1, S2. No MRG. Regular rate and rhythm. No peripheral edema, cyanosis or pallor.  Gastrointestinal:  Soft, nondistended, nontender. No rebound or guarding. Normal bowel sounds. No appreciable masses or hepatomegaly. Rectal:  Not performed.  Msk:  Symmetrical without gross deformities. Without edema, no deformity or joint abnormality.  Neurologic:  Alert and  oriented x4;  grossly normal neurologically.  Skin:   Dry and intact without significant lesions  or rashes. Psychiatric:  Demonstrates good judgement and reason without abnormal affect or behaviors.  No recent labs or imaging.  Assessment: 1.  History of adenomatous polyps: Last colonoscopy January 2020 with recommendations for repeat in 3 years, patient is overdue 2.  Chronic anticoagulation: On Xarelto for A-fib  Plan: 1.  Scheduled patient for surveillance colonoscopy in the [place] with Dr. Fuller Plan.  Did provide the patient a detailed list of risks for the procedure and he agrees to proceed. Patient is appropriate for endoscopic procedure(s) in the ambulatory ([facility]) setting.  2.  Patient advised to hold his Xarelto for 2 days prior to time of procedure.  We will communicate with his prescribing physician to ensure this is acceptable for him. 3.  Patient to follow in clinic per recommendations after time of procedure.  Ellouise Newer, PA-C [facility] 04/03/2022, 8:57 AM  Cc: [name], FNP

## 2022-04-03 NOTE — Patient Instructions (Signed)
_______________________________________________________  If you are age 61 or older, your body mass index should be between 23-30. Your Body mass index is 27.83 kg/m. If this is out of the aforementioned range listed, please consider follow up with your Primary Care Provider.  If you are age 3 or younger, your body mass index should be between 19-25. Your Body mass index is 27.83 kg/m. If this is out of the aformentioned range listed, please consider follow up with your Primary Care Provider.   You have been scheduled for a colonoscopy. Please follow written instructions given to you at your visit today.  Please pick up your prep supplies at the pharmacy within the next 1-3 days. If you use inhalers (even only as needed), please bring them with you on the day of your procedure.  The [facility] providers would like to encourage you to use [facility] to communicate with providers for non-urgent requests or questions.  Due to long hold times on the telephone, sending your provider a message by [facility] may be a faster and more efficient way to get a response.  Please allow 48 business hours for a response.  Please remember that this is for non-urgent requests.   It was a pleasure to see you today!  Thank you for trusting me with your gastrointestinal care!    Ellouise Newer, PA-C

## 2022-04-13 ENCOUNTER — Encounter: Payer: Self-pay | Admitting: Medical

## 2022-04-13 ENCOUNTER — Telehealth: Payer: Self-pay | Admitting: Cardiovascular Disease

## 2022-04-13 ENCOUNTER — Other Ambulatory Visit (HOSPITAL_BASED_OUTPATIENT_CLINIC_OR_DEPARTMENT_OTHER): Payer: Self-pay

## 2022-04-13 ENCOUNTER — Ambulatory Visit: Payer: BC Managed Care – PPO | Attending: Medical | Admitting: Medical

## 2022-04-13 ENCOUNTER — Other Ambulatory Visit: Payer: Self-pay

## 2022-04-13 ENCOUNTER — Telehealth: Payer: Self-pay

## 2022-04-13 VITALS — BP 136/80 | HR 77 | Ht 68.0 in | Wt 186.0 lb

## 2022-04-13 DIAGNOSIS — I25110 Atherosclerotic heart disease of native coronary artery with unstable angina pectoris: Secondary | ICD-10-CM

## 2022-04-13 DIAGNOSIS — E782 Mixed hyperlipidemia: Secondary | ICD-10-CM

## 2022-04-13 MED ORDER — EZETIMIBE 10 MG PO TABS: 10.0000 mg | ORAL_TABLET | Freq: Every day | ORAL | 3 refills | Status: DC

## 2022-04-13 MED ORDER — ROSUVASTATIN CALCIUM 20 MG PO TABS: ORAL_TABLET | ORAL | 3 refills | Status: DC

## 2022-04-13 MED ORDER — METOPROLOL TARTRATE 25 MG PO TABS: 12.5000 mg | ORAL_TABLET | Freq: Two times a day (BID) | ORAL | 3 refills | Status: DC

## 2022-04-13 MED ORDER — XARELTO 2.5 MG PO TABS: ORAL_TABLET | ORAL | 1 refills | Status: DC

## 2022-04-13 NOTE — Telephone Encounter (Signed)
Prescription refill request for Xarelto received.  Indication:Afib Last office visit:10/23 Weight:84.4 kg Age:61 Scr:0.9 CrCl:102.9 ml/min  Prescription refilled

## 2022-04-13 NOTE — Telephone Encounter (Signed)
*  STAT* If patient is at the pharmacy, call can be transferred to refill team.   1. Which medications need to be refilled? (please list name of each medication and dose if known) Xarelto  2. Which pharmacy/location (including street and city if local pharmacy) is medication to be sent to? [facility] on [address] in [place]  3. Do they need a 30 day or 90 day supply? [place]

## 2022-04-13 NOTE — Progress Notes (Signed)
Cardiology Office Note:    Date:  04/13/2022   ID:  [name], DOB 1960-11-15, MRN [id number]  PCP:  [name], [facility] Cardiologist:  None  [facility] Electrophysiologist:  None   Referring MD: [name], FNP   Chief Complaint: 1 year follow-up  History of Present Illness:    KHAMARI SHEEHAN is a 61 y.o. male with a hx of smoking, CAD s/p NSTEMI and CABG in 2017, post-op afib, PAD on Xarelto who presents for 1 year follow-up.   H/o CAD with NSTEMI and subsequent CABG in 2017. Echo at that time LVEF 55 to 06%, grade 1 diastolic dysfunction.  Last seen 04/10/2021 and overall doing well from a cardiac standpoint.  Today, the patient reports he is overall doing well. He needs refills of cardiac medications. He denies chest pain, shortness of breath, LLE, orthopnea, pnd, lightheadedness or dizziness. Denies  palpitations. He has a very physical job. Diet could be better, needs to eat more vegetables. Labs from 02/2022 were reviewed.   Past Medical History:  Diagnosis Date   Arthritis    Blood transfusion without reported diagnosis 2017   triple by pass   CAD (coronary artery disease)    a. nstemi 1.2017; b. cardiac cath 06/2015 with LM and severe 3 veseel dz (full report pending)   Depression    Family history of leukemia    Heart disease    History of chicken pox    HLD (hyperlipidemia)    pt. denies   NSTEMI (non-ST elevated myocardial infarction) ([facility]) 06/2015   Personal history of colonic polyps    Postoperative atrial fibrillation ([facility]) 07/01/2017   Tobacco abuse     Past Surgical History:  Procedure Laterality Date   CARDIAC CATHETERIZATION Bilateral 07/23/2015   Procedure: Left Heart Cath and Coronary Angiography;  Surgeon: Minna Merritts, MD;  Location: [facility];  Service: Cardiovascular;  Laterality: Bilateral;   CORONARY ARTERY BYPASS GRAFT N/A 07/25/2015   Procedure: CORONARY ARTERY BYPASS GRAFTING X 3 UTILIZING  BILATERAL IMA AND LEFT RADIAL ARTERY;  Surgeon: Melrose Nakayama, MD;  Location: [facility];  Service: Open Heart Surgery;  Laterality: N/A;   fractured toe Right 06/23/2011   great toe/with pin, pin has been removed   RADIAL ARTERY HARVEST Left 07/25/2015   Procedure: RADIAL ARTERY HARVEST;  Surgeon: Melrose Nakayama, MD;  Location: [facility];  Service: Open Heart Surgery;  Laterality: Left;   TEE WITHOUT CARDIOVERSION N/A 07/25/2015   Procedure: TRANSESOPHAGEAL ECHOCARDIOGRAM (TEE);  Surgeon: Melrose Nakayama, MD;  Location: [facility];  Service: Open Heart Surgery;  Laterality: N/A;    Current Medications: Current Meds  Medication Sig   aspirin 81 MG tablet Take 1 tablet (81 mg total) by mouth daily.   cetirizine (ZYRTEC) 10 MG tablet Take 1 tablet (10 mg total) by mouth daily as needed.   Cholecalciferol (VITAMIN D3) 50 MCG (2000 UT) capsule Take 2,000 Units by mouth daily.   clobetasol cream (TEMOVATE) 3.01 % Apply 1 Application topically 2 (two) times daily.   Cyanocobalamin (B-12) 3000 MCG CAPS Take 1 capsule by mouth daily.   famotidine (PEPCID) 10 MG tablet Take 10 mg by mouth daily.   metoprolol tartrate (LOPRESSOR) 25 MG tablet Take 12.5 mg by mouth 2 (two) times daily.   Multiple Vitamins-Minerals (MULTIVITAMIN WITH MINERALS) tablet Take 1 tablet by mouth daily.   [DISCONTINUED] ezetimibe (ZETIA) 10 MG tablet Take 1 tablet (10 mg total) by mouth  daily.   [DISCONTINUED] rivaroxaban (XARELTO) 2.5 MG TABS tablet TAKE 1 TABLET(2.5 MG) BY MOUTH TWICE DAILY   [DISCONTINUED] rosuvastatin (CRESTOR) 20 MG tablet TAKE 1 TABLET(20 MG) BY MOUTH DAILY PLEASE SCHEDULE APPOINTMENT FOR FURTHER REFILLS. THANK YOU!     Allergies:   Patient has no known allergies.   Social History   Socioeconomic History   Marital status: Married    Spouse name: Not on file   Number of children: 7   Years of education: Not on file   Highest education level: Not on file  Occupational History    Employer:  [employer]    Comment: Astronomer, Mining engineer  Tobacco Use   Smoking status: Some Days    Packs/day: 1.00    Years: 30.00    Total pack years: 30.00    Types: Cigarettes    Last attempt to quit: 07/17/2015    Years since quitting: 6.7   Smokeless tobacco: Never   Tobacco comments:    Occasional cigar-no longer smokes cigarettes  Vaping Use   Vaping Use: Never used  Substance and Sexual Activity   Alcohol use: Yes    Alcohol/week: 0.0 standard drinks of alcohol    Comment: rare   Drug use: No   Sexual activity: Yes    Partners: Female    Birth control/protection: None  Other Topics Concern   Not on file  Social History Narrative   Regular exercise--yes   Seven children    Oldest daughter passed away age 79   grandchildren   Social Determinants of Health   Financial Resource Strain: Not on file  Food Insecurity: Not on file  Transportation Needs: Not on file  Physical Activity: Not on file  Stress: Not on file  Social Connections: Not on file     Family History: The patient's family history includes Cancer - Other in his daughter; Heart attack (age of onset: 67) in his father; Leukemia in his paternal uncle; Myelodysplastic syndrome in his father; Rheum arthritis in his mother. There is no history of Colon cancer.  ROS:   Please see the history of present illness.     All other systems reviewed and are negative.  EKGs/Labs/Other Studies Reviewed:    The following studies were reviewed today:  Echo 2017 Study Conclusions   - Left ventricle: The cavity size was normal. Wall thickness was    increased. There was concentric hypertrophy. Systolic function    was reduced. The estimated ejection fraction was in the range of    40% to 45%.  - Aortic valve: Normal-sized, mildly calcified annulus. Trileaflet;    normal thickness leaflets. Valve area (VTI): 3.27 cm^2. Valve    area (Vmax): 4 cm^2. Valve area (Vmean): 3.49 cm^2.  - Mitral valve: There was mild  regurgitation directed centrally.  - Left atrium: No evidence of thrombus in the atrial cavity or    appendage.  - Right ventricle: The cavity size was dilated.  - Atrial septum: No defect or patent foramen ovale was identified.   Echo 2017 Study Conclusions   - Left ventricle: The cavity size was normal. Systolic function was    normal. The estimated ejection fraction was in the range of 55%    to 60%. Although no diagnostic regional wall motion abnormality    was identified, this possibility cannot be completely excluded on    the basis of this study. Doppler parameters are consistent with    abnormal left ventricular relaxation (grade 1 diastolic  dysfunction).  - Aortic valve: Trileaflet; mildly thickened, mildly calcified    leaflets.   Cardiac cath 06/2015 Ost RCA to Prox RCA lesion, 90% stenosed. Ost LM lesion, 90% stenosed. Ost LAD to Prox LAD lesion, 90% stenosed. Prox Cx to Mid Cx lesion, 80% stenosed. The left ventricular systolic function is normal.    EKG:  EKG is ordered today.  The ekg ordered today demonstrates NSR, 77bpm, nonspecific T wave changes  Recent Labs: No results found for requested labs within last 365 days.  Recent Lipid Panel    Component Value Date/Time   CHOL 133 09/05/2018 0920   TRIG 68 09/05/2018 0920   HDL 42 09/05/2018 0920   CHOLHDL 3.2 09/05/2018 0920   CHOLHDL 3.5 08/09/2017 0958   VLDL 25 08/09/2017 0958   LDLCALC 77 09/05/2018 0920   LDLDIRECT 140.9 11/15/2012 0839     Physical Exam:    VS:  BP 136/80 (BP Location: Left Arm, Patient Position: Sitting, Cuff Size: Normal)   Pulse 77   Ht '5\' 8"'$  (1.727 m)   Wt 186 lb (84.4 kg)   SpO2 99%   BMI 28.28 kg/m     Wt Readings from Last 3 Encounters:  04/13/22 186 lb (84.4 kg)  04/03/22 183 lb (83 kg)  03/09/22 185 lb (83.9 kg)     GEN:  Well nourished, well developed in no acute distress HEENT: Normal NECK: No JVD; No carotid bruits LYMPHATICS: No  lymphadenopathy CARDIAC: RRR, no murmurs, rubs, gallops RESPIRATORY:  Clear to auscultation without rales, wheezing or rhonchi  ABDOMEN: Soft, non-tender, non-distended MUSCULOSKELETAL:  No edema; No deformity  SKIN: Warm and dry NEUROLOGIC:  Alert and oriented x 3 PSYCHIATRIC:  Normal affect   ASSESSMENT:    1. Coronary artery disease involving native coronary artery of native heart with unstable angina pectoris ([place])   2. Hyperlipidemia, mixed    PLAN:    In order of problems listed above:  CAD s/p CABG x3 in 2017 Patient denies anginal symptoms. He stays fairly active at his job, he does no formal exercise. Diet changes were briefly discussed. No plan for further ischemic work-up at this time. Continue Aspirin, Zetia, Crestor, Lopressor and Xarelto. We will refill his medications.  HLD LDL 69 02/2022. Continue Crestor and Zetia, we will refill these.   Disposition: Follow up in 1 year(s) with MD/APP    Signed, Haris Baack Ninfa Meeker, PA-C  04/13/2022 8:43 AM    [facility]

## 2022-04-13 NOTE — Telephone Encounter (Signed)
Pt c/o medication issue:  1. Name of Medication:  metoprolol tartrate (LOPRESSOR) 25 MG tablet  2. How are you currently taking this medication (dosage and times per day)?   3. Are you having a reaction (difficulty breathing--STAT)?   4. What is your medication issue?   Rx for Metoprolol has been received by pharmacy. Pharmacist called in requesting clarification on instructions as they have received 2 sets. She states a call back is not necessary if we are able to send in corrected instructions.

## 2022-04-13 NOTE — Patient Instructions (Signed)
Medication Instructions:  Your physician recommends that you continue on your current medications as directed. Please refer to the Current Medication list given to you today.  *If you need a refill on your cardiac medications before your next appointment, please call your pharmacy*   Lab Work: NONE   If you have labs (blood work) drawn today and your tests are completely normal, you will receive your results only by: [place] (if you have MyChart) OR A paper copy in the mail If you have any lab test that is abnormal or we need to change your treatment, we will call you to review the results.   Testing/Procedures: NONE    Follow-Up: At [facility], you and your health needs are our priority.  As part of our continuing mission to provide you with exceptional heart care, we have created designated Provider Care Teams.  These Care Teams include your primary Cardiologist (physician) and Advanced Practice Providers (APPs -  Physician Assistants and Nurse Practitioners) who all work together to provide you with the care you need, when you need it.  We recommend signing up for the patient portal called "MyChart".  Sign up information is provided on this After Visit Summary.  MyChart is used to connect with patients for Virtual Visits (Telemedicine).  Patients are able to view lab/test results, encounter notes, upcoming appointments, etc.  Non-urgent messages can be sent to your provider as well.   To learn more about what you can do with MyChart, go to [email or web address].    Your next appointment:   1 year(s)  The format for your next appointment:   In Person  Provider:   You may see None or one of the following Advanced Practice Providers on your designated Care Team:   Murray Hodgkins, NP Christell Faith, PA-C Cadence Kathlen Mody, PA-C Gerrie Nordmann, NP    Other Instructions Thank you for choosing [place]!    Important Information About Sugar

## 2022-04-20 ENCOUNTER — Encounter: Payer: Self-pay | Admitting: Gastroenterology

## 2022-04-27 ENCOUNTER — Ambulatory Visit (AMBULATORY_SURGERY_CENTER): Payer: BC Managed Care – PPO | Admitting: Gastroenterology

## 2022-04-27 ENCOUNTER — Encounter: Payer: Self-pay | Admitting: Gastroenterology

## 2022-04-27 VITALS — BP 106/69 | HR 64 | Temp 98.9°F | Resp 13 | Ht 68.0 in | Wt 183.0 lb

## 2022-04-27 DIAGNOSIS — Z86010 Personal history of colonic polyps: Secondary | ICD-10-CM | POA: Diagnosis not present

## 2022-04-27 DIAGNOSIS — D123 Benign neoplasm of transverse colon: Secondary | ICD-10-CM

## 2022-04-27 DIAGNOSIS — D128 Benign neoplasm of rectum: Secondary | ICD-10-CM

## 2022-04-27 DIAGNOSIS — Z09 Encounter for follow-up examination after completed treatment for conditions other than malignant neoplasm: Secondary | ICD-10-CM | POA: Diagnosis not present

## 2022-04-27 DIAGNOSIS — D122 Benign neoplasm of ascending colon: Secondary | ICD-10-CM | POA: Diagnosis not present

## 2022-04-27 DIAGNOSIS — Z1211 Encounter for screening for malignant neoplasm of colon: Secondary | ICD-10-CM | POA: Diagnosis not present

## 2022-04-27 MED ORDER — SODIUM CHLORIDE 0.9 % IV SOLN: 500.0000 mL | Freq: Once | INTRAVENOUS | Status: DC

## 2022-04-27 NOTE — Progress Notes (Signed)
Sedate, gd SR, tolerated procedure well, VSS, report to RN 

## 2022-04-27 NOTE — Progress Notes (Signed)
See 04/03/2022 H&P, no changes

## 2022-04-27 NOTE — Progress Notes (Signed)
Vs by DT  Pt's states no medical or surgical changes since previsit or office visit.  

## 2022-04-27 NOTE — Op Note (Signed)
[facility] Patient Name: Jeffrey Spence Procedure Date: 04/27/2022 3:42 PM MRN: [id number] Endoscopist: Ladene Artist , MD, [id number] Age: 61 Referring MD:  Date of Birth: 1961-06-18 Gender: Male Account #: [id number] Procedure:                Colonoscopy Indications:              Surveillance: Personal history of adenomatous                            polyps on last colonoscopy 3 years ago Medicines:                Monitored Anesthesia Care Procedure:                Pre-Anesthesia Assessment:                           - Prior to the procedure, a History and Physical                            was performed, and patient medications and                            allergies were reviewed. The patient's tolerance of                            previous anesthesia was also reviewed. The risks                            and benefits of the procedure and the sedation                            options and risks were discussed with the patient.                            All questions were answered, and informed consent                            was obtained. Prior Anticoagulants: The patient has                            taken Xarelto (rivaroxaban), last dose was 2 days                            prior to procedure. ASA Grade Assessment: III - A                            patient with severe systemic disease. After                            reviewing the risks and benefits, the patient was                            deemed in satisfactory condition to undergo the  procedure.                           After obtaining informed consent, the colonoscope                            was passed under direct vision. Throughout the                            procedure, the patient's blood pressure, pulse, and                            oxygen saturations were monitored continuously. The                            Olympus CF-HQ190L [id number]) Colonoscope was                             introduced through the anus and advanced to the the                            cecum, identified by appendiceal orifice and                            ileocecal valve. The ileocecal valve, appendiceal                            orifice, and rectum were photographed. The quality                            of the bowel preparation was good. The colonoscopy                            was performed without difficulty. The patient                            tolerated the procedure well. Scope In: 3:59:40 PM Scope Out: 4:18:37 PM Scope Withdrawal Time: 0 hours 16 minutes 6 seconds  Total Procedure Duration: 0 hours 18 minutes 57 seconds  Findings:                 The perianal and digital rectal examinations were                            normal.                           Six sessile polyps were found in the rectum (1),                            transverse colon (4) and ascending colon (1). The                            polyps were 5 to 8 mm in size. These polyps were  removed with a cold snare. Resection and retrieval                            were complete.                           Internal hemorrhoids were found during                            retroflexion. The hemorrhoids were small and Grade                            I (internal hemorrhoids that do not prolapse).                           The exam was otherwise without abnormality on                            direct and retroflexion views. Complications:            No immediate complications. Estimated blood loss:                            None. Estimated Blood Loss:     Estimated blood loss: none. Impression:               - Six 5 to 8 mm polyps in the rectum, in the                            transverse colon and in the ascending colon,                            removed with a cold snare. Resected and retrieved.                           - Internal hemorrhoids.                            - The examination was otherwise normal on direct                            and retroflexion views. Recommendation:           - Repeat colonoscopy, likely 3 years, date to be                            determined after pending pathology results are                            reviewed for surveillance based on pathology                            results.                           - Resume Xarelto (rivaroxaban) in 2 days at prior  dose. Refer to managing physician for further                            adjustment of therapy.                           - Patient has a contact number available for                            emergencies. The signs and symptoms of potential                            delayed complications were discussed with the                            patient. Return to normal activities tomorrow.                            Written discharge instructions were provided to the                            patient.                           - Resume previous diet.                           - Continue present medications.                           - Await pathology results. Ladene Artist, MD 04/27/2022 4:23:06 PM This report has been signed electronically.

## 2022-04-27 NOTE — Patient Instructions (Signed)
Information on polyps given to you today.  Await pathology results.  Resume Xarelto in 2 days at prior dose.  Resume previous diet and medications.    YOU HAD AN ENDOSCOPIC PROCEDURE TODAY AT [facility]:   Refer to the procedure report that was given to you for any specific questions about what was found during the examination.  If the procedure report does not answer your questions, please call your gastroenterologist to clarify.  If you requested that your care partner not be given the details of your procedure findings, then the procedure report has been included in a sealed envelope for you to review at your convenience later.  YOU SHOULD EXPECT: Some feelings of bloating in the abdomen. Passage of more gas than usual.  Walking can help get rid of the air that was put into your GI tract during the procedure and reduce the bloating. If you had a lower endoscopy (such as a colonoscopy or flexible sigmoidoscopy) you may notice spotting of blood in your stool or on the toilet paper. If you underwent a bowel prep for your procedure, you may not have a normal bowel movement for a few days.  Please Note:  You might notice some irritation and congestion in your nose or some drainage.  This is from the oxygen used during your procedure.  There is no need for concern and it should clear up in a day or so.  SYMPTOMS TO REPORT IMMEDIATELY:  Following lower endoscopy (colonoscopy or flexible sigmoidoscopy):  Excessive amounts of blood in the stool  Significant tenderness or worsening of abdominal pains  Swelling of the abdomen that is new, acute  Fever of 100F or higher   For urgent or emergent issues, a gastroenterologist can be reached at any hour by calling [phone]. Do not use MyChart messaging for urgent concerns.    DIET:  We do recommend a small meal at first, but then you may proceed to your regular diet.  Drink plenty of fluids but you should avoid alcoholic  beverages for 24 hours.  ACTIVITY:  You should plan to take it easy for the rest of today and you should NOT DRIVE or use heavy machinery until tomorrow (because of the sedation medicines used during the test).    FOLLOW UP: Our staff will call the number listed on your records the next business day following your procedure.  We will call around 7:15- 8:00 am to check on you and address any questions or concerns that you may have regarding the information given to you following your procedure. If we do not reach you, we will leave a message.     If any biopsies were taken you will be contacted by phone or by letter within the next 1-3 weeks.  Please call us at [phone] if you have not heard about the biopsies in 3 weeks.    SIGNATURES/CONFIDENTIALITY: You and/or your care partner have signed paperwork which will be entered into your electronic medical record.  These signatures attest to the fact that that the information above on your After Visit Summary has been reviewed and is understood.  Full responsibility of the confidentiality of this discharge information lies with you and/or your care-partner.

## 2022-04-28 ENCOUNTER — Telehealth: Payer: Self-pay

## 2022-04-28 NOTE — Telephone Encounter (Signed)
  Follow up Call-     04/27/2022    3:20 PM  Call back number  Post procedure Call Back phone  # [phone]  Permission to leave phone message Yes     Patient questions:  Do you have a fever, pain , or abdominal swelling? No. Pain Score  0 *  Have you tolerated food without any problems? Yes.    Have you been able to return to your normal activities? Yes.    Do you have any questions about your discharge instructions: Diet   No. Medications  No. Follow up visit  No.  Do you have questions or concerns about your Care? No.  Actions: * If pain score is 4 or above: No action needed, pain <4.

## 2022-05-18 ENCOUNTER — Encounter: Payer: Self-pay | Admitting: Gastroenterology

## 2022-10-29 ENCOUNTER — Other Ambulatory Visit: Payer: Self-pay | Admitting: Family

## 2022-10-29 DIAGNOSIS — J309 Allergic rhinitis, unspecified: Secondary | ICD-10-CM

## 2022-10-30 ENCOUNTER — Other Ambulatory Visit: Payer: Self-pay | Admitting: *Deleted

## 2022-10-30 MED ORDER — XARELTO 2.5 MG PO TABS: ORAL_TABLET | ORAL | 0 refills | Status: DC

## 2022-10-30 NOTE — Telephone Encounter (Addendum)
Prescription refill request for Xarelto received.  Indication: pad Last office visit: Jeffrey Spence, 04/13/2022 Weight: 83 kg  Age: 62 yo  Scr: 0.98, 02/24/2022 CrCl: 93 ml/min

## 2022-10-30 NOTE — Addendum Note (Signed)
Addended by: Mellody Dance B on: 10/30/2022 08:16 AM   Modules accepted: Orders

## 2023-01-22 ENCOUNTER — Encounter: Payer: Self-pay | Admitting: *Deleted

## 2023-04-20 ENCOUNTER — Ambulatory Visit: Payer: BC Managed Care – PPO | Admitting: Medical

## 2023-04-26 ENCOUNTER — Other Ambulatory Visit: Payer: Self-pay

## 2023-04-26 DIAGNOSIS — I25110 Atherosclerotic heart disease of native coronary artery with unstable angina pectoris: Secondary | ICD-10-CM

## 2023-04-26 MED ORDER — XARELTO 2.5 MG PO TABS: ORAL_TABLET | ORAL | 0 refills | Status: DC

## 2023-04-26 NOTE — Telephone Encounter (Signed)
Prescription refill request for Xarelto received.  Indication: CAD Last office visit: 04/13/22 Jeffrey Spence)  Weight: 83kg Age: 62 Scr: 1.06 (03/25/23 via [facility])  CrCl: 84.77ml/min  Appropriate dose. Refill sent.

## 2023-05-06 ENCOUNTER — Encounter: Payer: Self-pay | Admitting: Medical

## 2023-05-06 ENCOUNTER — Ambulatory Visit: Payer: BC Managed Care – PPO | Attending: Medical | Admitting: Medical

## 2023-05-06 VITALS — BP 130/62 | HR 83 | Ht 67.0 in | Wt 189.4 lb

## 2023-05-06 DIAGNOSIS — Z720 Tobacco use: Secondary | ICD-10-CM

## 2023-05-06 DIAGNOSIS — I25110 Atherosclerotic heart disease of native coronary artery with unstable angina pectoris: Secondary | ICD-10-CM | POA: Diagnosis not present

## 2023-05-06 DIAGNOSIS — Z79899 Other long term (current) drug therapy: Secondary | ICD-10-CM | POA: Diagnosis not present

## 2023-05-06 DIAGNOSIS — E782 Mixed hyperlipidemia: Secondary | ICD-10-CM

## 2023-05-06 MED ORDER — ROSUVASTATIN CALCIUM 20 MG PO TABS: ORAL_TABLET | ORAL | 3 refills | Status: DC

## 2023-05-06 MED ORDER — XARELTO 2.5 MG PO TABS: ORAL_TABLET | ORAL | 3 refills | Status: DC

## 2023-05-06 MED ORDER — EZETIMIBE 10 MG PO TABS: 10.0000 mg | ORAL_TABLET | Freq: Every day | ORAL | 3 refills | Status: DC

## 2023-05-06 MED ORDER — METOPROLOL TARTRATE 25 MG PO TABS: 12.5000 mg | ORAL_TABLET | Freq: Two times a day (BID) | ORAL | 3 refills | Status: DC

## 2023-05-06 NOTE — Patient Instructions (Addendum)
Medication Instructions:  Your Physician recommend you continue on your current medication as directed.    *If you need a refill on your cardiac medications before your next appointment, please call your pharmacy*   Lab Work: Your provider would like for you to return  to have the following labs drawn: CMP, Lipids, CBC and TSH.   Please go to [facility] [address] ([facility] [address], [place] [address] You do not need an appointment.  They are open from 7:30 am-4 pm.  Lunch from 1:00 pm- 2:00 pm You will need to be fasting.   If you have labs (blood work) drawn today and your tests are completely normal, you will receive your results only by: MyChart Message (if you have MyChart) OR A paper copy in the mail If you have any lab test that is abnormal or we need to change your treatment, we will call you to review the results.   Testing/Procedures: None ordered today.    Follow-Up: At [facility], you and your health needs are our priority.  As part of our continuing mission to provide you with exceptional heart care, we have created designated Provider Care Teams.  These Care Teams include your primary Cardiologist (physician) and Advanced Practice Providers (APPs -  Physician Assistants and Nurse Practitioners) who all work together to provide you with the care you need, when you need it.  We recommend signing up for the patient portal called "MyChart".  Sign up information is provided on this After Visit Summary.  MyChart is used to connect with patients for Virtual Visits (Telemedicine).  Patients are able to view lab/test results, encounter notes, upcoming appointments, etc.  Non-urgent messages can be sent to your provider as well.   To learn more about what you can do with MyChart, go to [email or web address].    Your next appointment:   1 year(s)  Provider:   Julien Nordmann, MD

## 2023-05-06 NOTE — Progress Notes (Signed)
Cardiology Office Note:    Date:  05/06/2023   ID:  Jeffrey Spence, DOB [birth year], MRN [id number]  PCP:  Mort Sawyers, FNP  [facility] Cardiologist:  None  [facility] Electrophysiologist:  None   Referring MD: Mort Sawyers, FNP   Chief Complaint: 1 year follow-up  History of Present Illness:    Jeffrey Spence is a 61 y.o. male with a hx of smoking, CAD s/p NSTEMI and CABG in 2017, post-op afib, PAD on Xarelto who presents for 1 year follow-up.    H/o CAD with NSTEMI and subsequent CABG in 2017. Echo at that time LVEF 55 to 60%, grade 1 diastolic dysfunction.  The patient was last seen 04/14/23 and was overall doing well.   Today, the patient has been doing well. He denies chest pain, SOB, lower leg edema, lightheadedness, dizziness, heart racing, orthopnea or pnd. The patient walks continuously at work. Diet could be better. He is still smoking, he doesn't want to quit.   Past Medical History:  Diagnosis Date   Arthritis    Blood transfusion without reported diagnosis 2017   triple by pass   CAD (coronary artery disease)    a. nstemi 1.2017; b. cardiac cath 06/2015 with LM and severe 3 veseel dz (full report pending)   Depression    Family history of leukemia    Heart disease    History of chicken pox    HLD (hyperlipidemia)    pt. denies   NSTEMI (non-ST elevated myocardial infarction) (HCC) 06/2015   Personal history of colonic polyps    Postoperative atrial fibrillation (HCC) 07/01/2017   Tobacco abuse     Past Surgical History:  Procedure Laterality Date   CARDIAC CATHETERIZATION Bilateral 07/23/2015   Procedure: Left Heart Cath and Coronary Angiography;  Surgeon: Antonieta Iba, MD;  Location: [facility] INVASIVE CV LAB;  Service: Cardiovascular;  Laterality: Bilateral;   CORONARY ARTERY BYPASS GRAFT N/A 07/25/2015   Procedure: CORONARY ARTERY BYPASS GRAFTING X 3 UTILIZING BILATERAL IMA AND LEFT RADIAL ARTERY;  Surgeon: Loreli Slot, MD;  Location:  [facility] OR;  Service: Open Heart Surgery;  Laterality: N/A;   fractured toe Right 06/23/2011   great toe/with pin, pin has been removed   RADIAL ARTERY HARVEST Left 07/25/2015   Procedure: RADIAL ARTERY HARVEST;  Surgeon: Loreli Slot, MD;  Location: [facility] OR;  Service: Open Heart Surgery;  Laterality: Left;   TEE WITHOUT CARDIOVERSION N/A 07/25/2015   Procedure: TRANSESOPHAGEAL ECHOCARDIOGRAM (TEE);  Surgeon: Loreli Slot, MD;  Location: [facility] OR;  Service: Open Heart Surgery;  Laterality: N/A;    Current Medications: Current Meds  Medication Sig   aspirin 81 MG tablet Take 1 tablet (81 mg total) by mouth daily.   cetirizine (ZYRTEC) 10 MG tablet TAKE 1 TABLET(10 MG) BY MOUTH DAILY AS NEEDED   Cholecalciferol (VITAMIN D3) 50 MCG (2000 UT) capsule Take 2,000 Units by mouth daily.   clobetasol cream (TEMOVATE) 0.05 % Apply 1 Application topically 2 (two) times daily.   Cyanocobalamin (B-12) 3000 MCG CAPS Take 1 capsule by mouth daily.   famotidine (PEPCID) 10 MG tablet Take 10 mg by mouth daily.   Multiple Vitamins-Minerals (MULTIVITAMIN WITH MINERALS) tablet Take 1 tablet by mouth daily.   [DISCONTINUED] ezetimibe (ZETIA) 10 MG tablet Take 1 tablet (10 mg total) by mouth daily.   [DISCONTINUED] metoprolol tartrate (LOPRESSOR) 25 MG tablet Take 12.5 mg by mouth 2 (two) times daily.   [DISCONTINUED] rivaroxaban (XARELTO) 2.5 MG TABS  tablet TAKE 1 TABLET(2.5 MG) BY MOUTH TWICE DAILY   [DISCONTINUED] rosuvastatin (CRESTOR) 20 MG tablet TAKE 1 TABLET(20 MG) BY MOUTH DAILY     Allergies:   Patient has no known allergies.   Social History   Socioeconomic History   Marital status: Married    Spouse name: Not on file   Number of children: 7   Years of education: Not on file   Highest education level: Not on file  Occupational History    Employer: [employer]    Comment: Museum/gallery conservator, Designer, television/film set  Tobacco Use   Smoking status: Some Days    Current packs/day: 0.00    Average  packs/day: 1 pack/day for 30.0 years (30.0 ttl pk-yrs)    Types: Cigarettes    Start date: 07/16/1985    Last attempt to quit: 07/17/2015    Years since quitting: 7.8   Smokeless tobacco: Never   Tobacco comments:    Occasional cigar-no longer smokes cigarettes  Vaping Use   Vaping status: Never Used  Substance and Sexual Activity   Alcohol use: Yes    Alcohol/week: 0.0 standard drinks of alcohol    Comment: rare   Drug use: No   Sexual activity: Yes    Partners: Female    Birth control/protection: None  Other Topics Concern   Not on file  Social History Narrative   Regular exercise--yes   Seven children    Oldest daughter passed away age 59   grandchildren   Social Determinants of Health   Financial Resource Strain: Not on file  Food Insecurity: Not on file  Transportation Needs: Not on file  Physical Activity: Not on file  Stress: Not on file  Social Connections: Not on file     Family History: The patient's family history includes Cancer - Other in his daughter; Heart attack (age of onset: 26) in his father; Leukemia in his paternal uncle; Myelodysplastic syndrome in his father; Rheum arthritis in his mother. There is no history of Colon cancer, Esophageal cancer, Rectal cancer, or Stomach cancer.  ROS:   Please see the history of present illness.     All other systems reviewed and are negative.  EKGs/Labs/Other Studies Reviewed:    The following studies were reviewed today:  Echo 2017 Study Conclusions   - Left ventricle: The cavity size was normal. Systolic function was    normal. The estimated ejection fraction was in the range of 55%    to 60%. Although no diagnostic regional wall motion abnormality    was identified, this possibility cannot be completely excluded on    the basis of this study. Doppler parameters are consistent with    abnormal left ventricular relaxation (grade 1 diastolic    dysfunction).  - Aortic valve: Trileaflet; mildly thickened,  mildly calcified    leaflets.   [facility] 06/2015 Left Heart Cath and Coronary Angiography   Conclusion  Ost RCA to Prox RCA lesion, 90% stenosed. Ost LM lesion, 90% stenosed. Ost LAD to Prox LAD lesion, 90% stenosed. Prox Cx to Mid Cx lesion, 80% stenosed. The left ventricular systolic function is normal.    EKG:  EKG is ordered today.  The ekg ordered today demonstrates NSR 83bpm, TWI III  Recent Labs: No results found for requested labs within last 365 days.  Recent Lipid Panel    Component Value Date/Time   CHOL 133 09/05/2018 0920   TRIG 68 09/05/2018 0920   HDL 42 09/05/2018 0920   CHOLHDL 3.2 09/05/2018 0920  CHOLHDL 3.5 08/09/2017 0958   VLDL 25 08/09/2017 0958   LDLCALC 77 09/05/2018 0920   LDLDIRECT 140.9 11/15/2012 0839    Physical Exam:    VS:  BP 130/62 (BP Location: Left Arm, Patient Position: Sitting, Cuff Size: Normal)   Pulse 83   Ht 5\' 7"  (1.702 m)   Wt 189 lb 6 oz (85.9 kg)   SpO2 97%   BMI 29.66 kg/m     Wt Readings from Last 3 Encounters:  05/06/23 189 lb 6 oz (85.9 kg)  04/27/22 183 lb (83 kg)  04/13/22 186 lb (84.4 kg)     GEN:  Well nourished, well developed in no acute distress HEENT: Normal NECK: No JVD; No carotid bruits LYMPHATICS: No lymphadenopathy CARDIAC: RRR, no murmurs, rubs, gallops RESPIRATORY:  Clear to auscultation without rales, wheezing or rhonchi  ABDOMEN: Soft, non-tender, non-distended MUSCULOSKELETAL:  No edema; No deformity  SKIN: Warm and dry NEUROLOGIC:  Alert and oriented x 3 PSYCHIATRIC:  Normal affect   ASSESSMENT:    1. Coronary artery disease involving native coronary artery of native heart with unstable angina pectoris (HCC)   2. Hyperlipidemia, mixed   3. Medication management   4. Tobacco use    PLAN:    In order of problems listed above:  CAD s/p CABG x 3 2017 Patient denies anginal symptoms.  Patient is very active at his job, but does no formal exercise.  Diet could be better.  No plan for  ischemic workup at this time.  Continue aspirin, Zetia, Crestor, Lopressor and Xarelto.  We will refill all his medications today.  I will update labs, c-Met, CBC, lipid panel, TSH.  HLD LDL 69 in 2023.  I will update labs as above.  Continue Crestor and Zetia.  Tobacco use He is still smoking, he isn't interested in quitting.   Disposition: Follow up in 1 year(s) with MD    Signed, Shelsea Hangartner David Stall, PA-C  05/06/2023 4:10 PM    [facility]

## 2023-05-26 ENCOUNTER — Other Ambulatory Visit: Payer: Self-pay | Admitting: *Deleted

## 2023-05-26 DIAGNOSIS — J309 Allergic rhinitis, unspecified: Secondary | ICD-10-CM

## 2023-07-09 ENCOUNTER — Encounter: Payer: Self-pay | Admitting: Genetic Counselor

## 2023-07-09 DIAGNOSIS — Z1589 Genetic susceptibility to other disease: Secondary | ICD-10-CM | POA: Insufficient documentation

## 2023-08-20 ENCOUNTER — Telehealth: Payer: Self-pay | Admitting: Cardiovascular Disease

## 2023-08-20 NOTE — Telephone Encounter (Signed)
 The patient has been notified of the results along with recommendations. Pt verbalized understanding. All questions (if any) were answered.  Pt reminded of upcoming appt and encouraged to call if any concerns or questions arise   Maccia, Melissa D, RPH-[id number]  (11:39 AM)   "He is on low dose Xarelto. He should avoid Advil is possible. Can try tylenol, but this wont decrease swelling. If tylenol doesn't work, he can take advil 200-400mg  every 6 hours for a day or two."

## 2023-08-20 NOTE — Telephone Encounter (Signed)
 He is on low dose Xarelto. He should avoid Advil is possible. Can try tylenol, but this wont decrease swelling. If tylenol doesn't work, he can take advil 200-400mg  every 6 hours for a day or two.

## 2023-08-20 NOTE — Telephone Encounter (Signed)
 Pt called in stating he is getting a tooth extracted and they told him to take Advil for pain but he is not sure if that is safe to take with cardiac condition and meds. Please advise what would be safe for him to take.

## 2023-10-27 ENCOUNTER — Ambulatory Visit: Admitting: Family

## 2023-10-29 ENCOUNTER — Encounter: Payer: Self-pay | Admitting: Family

## 2023-10-29 ENCOUNTER — Ambulatory Visit: Admitting: Family

## 2023-10-29 VITALS — BP 112/72 | HR 76 | Temp 99.0°F | Ht 68.5 in | Wt 182.8 lb

## 2023-10-29 DIAGNOSIS — M2550 Pain in unspecified joint: Secondary | ICD-10-CM | POA: Insufficient documentation

## 2023-10-29 DIAGNOSIS — Z8261 Family history of arthritis: Secondary | ICD-10-CM | POA: Insufficient documentation

## 2023-10-29 DIAGNOSIS — Z125 Encounter for screening for malignant neoplasm of prostate: Secondary | ICD-10-CM | POA: Diagnosis not present

## 2023-10-29 DIAGNOSIS — E782 Mixed hyperlipidemia: Secondary | ICD-10-CM

## 2023-10-29 DIAGNOSIS — Z79899 Other long term (current) drug therapy: Secondary | ICD-10-CM

## 2023-10-29 NOTE — Assessment & Plan Note (Signed)
 Lab workup in place to r/o autoimmune disease specifically RF with this being in the family.   NSAIDS not an option due to being on dual blood thinner.  Tylenol  prn.  Compression arthritis gloves recommended.  Heat prn

## 2023-10-29 NOTE — Assessment & Plan Note (Signed)
 Ordered lipid panel, pending results. Work on low cholesterol diet and exercise as tolerated

## 2023-10-29 NOTE — Progress Notes (Signed)
 Established Patient Office Visit  Subjective:   Patient ID: Jeffrey Spence, male    DOB: December 08, 1960  Age: 64 y.o. MRN: [id number]  CC:  Chief Complaint  Patient presents with   Arthritis    HPI: Jeffrey Spence is a 63 y.o. male presenting on 10/29/2023 for Arthritis   Has noticed in the last few months with bil hand stiffness and will at times feel 'clicking' in the joints. No swelling in the joints. He also has arthritis in the knees has had a knee replacement left side, and pending right side. Sees orthopedist. Can not take ibuprofen with his blood thinners. Takes tylenol  prn with mild relief of symptoms. Mom with h/o rheumatoid arthritis. At times has noticed decreased grip/strength in the hands especially when really stiff. Stiffness worse in am takes > 1 hour to work out.       ROS: Negative unless specifically indicated above in HPI.   Relevant past medical history reviewed and updated as indicated.   Allergies and medications reviewed and updated.   Current Outpatient Medications:    aspirin  81 MG tablet, Take 1 tablet (81 mg total) by mouth daily., Disp: , Rfl:    cetirizine  (ZYRTEC ) 10 MG tablet, TAKE 1 TABLET(10 MG) BY MOUTH DAILY AS NEEDED, Disp: 90 tablet, Rfl: 1   Cholecalciferol (VITAMIN D3) 50 MCG (2000 UT) capsule, Take 2,000 Units by mouth daily., Disp: , Rfl:    clobetasol  cream (TEMOVATE ) 0.05 %, Apply 1 Application topically 2 (two) times daily., Disp: 30 g, Rfl: 0   Cyanocobalamin  (B-12) 3000 MCG CAPS, Take 1 capsule by mouth daily., Disp: , Rfl:    ezetimibe  (ZETIA ) 10 MG tablet, Take 1 tablet (10 mg total) by mouth daily., Disp: 90 tablet, Rfl: 3   metoprolol  tartrate (LOPRESSOR ) 25 MG tablet, Take 0.5 tablets (12.5 mg total) by mouth 2 (two) times daily., Disp: 90 tablet, Rfl: 3   Multiple Vitamins-Minerals (MULTIVITAMIN WITH MINERALS) tablet, Take 1 tablet by mouth daily., Disp: , Rfl:    rivaroxaban  (XARELTO ) 2.5 MG TABS tablet, TAKE 1 TABLET(2.5 MG) BY  MOUTH TWICE DAILY, Disp: 180 tablet, Rfl: 3   rosuvastatin  (CRESTOR ) 20 MG tablet, TAKE 1 TABLET(20 MG) BY MOUTH DAILY, Disp: 90 tablet, Rfl: 3  No Known Allergies  Objective:   BP 112/72 (BP Location: Right Arm, Patient Position: Sitting, Cuff Size: Normal)   Pulse 76   Temp 99 F (37.2 C) (Temporal)   Ht 5' 8.5" (1.74 m)   Wt 182 lb 12.8 oz (82.9 kg)   SpO2 99%   BMI 27.39 kg/m    Physical Exam Constitutional:      General: He is not in acute distress.    Appearance: Normal appearance. He is normal weight. He is not ill-appearing, toxic-appearing or diaphoretic.  Cardiovascular:     Rate and Rhythm: Normal rate.  Pulmonary:     Effort: Pulmonary effort is normal.  Musculoskeletal:        General: Normal range of motion.  Neurological:     General: No focal deficit present.     Mental Status: He is alert and oriented to person, place, and time. Mental status is at baseline.  Psychiatric:        Mood and Affect: Mood normal.        Behavior: Behavior normal.        Thought Content: Thought content normal.        Judgment: Judgment normal.     Assessment &  Plan:  Polyarthralgia Assessment & Plan: Lab workup in place to r/o autoimmune disease specifically RF with this being in the family.   NSAIDS not an option due to being on dual blood thinner.  Tylenol  prn.  Compression arthritis gloves recommended.  Heat prn   Orders: -     C-reactive protein -     Rheumatoid factor -     Sedimentation rate -     ANA w/Reflex -     TSH  Family history of rheumatoid arthritis  Screening for prostate cancer -     PSA  Mixed hyperlipidemia Assessment & Plan: Ordered lipid panel, pending results. Work on low cholesterol diet and exercise as tolerated   Orders: -     Lipid panel  On statin therapy -     Comprehensive metabolic panel with GFR     Follow up plan: Return for f/u CPE.  Felicita Horns, FNP

## 2023-11-09 DIAGNOSIS — Z0189 Encounter for other specified special examinations: Secondary | ICD-10-CM | POA: Diagnosis not present

## 2024-03-01 ENCOUNTER — Encounter: Payer: Self-pay | Admitting: Family

## 2024-03-01 DIAGNOSIS — F17200 Nicotine dependence, unspecified, uncomplicated: Secondary | ICD-10-CM

## 2024-03-01 DIAGNOSIS — E782 Mixed hyperlipidemia: Secondary | ICD-10-CM

## 2024-03-01 DIAGNOSIS — I214 Non-ST elevation (NSTEMI) myocardial infarction: Secondary | ICD-10-CM

## 2024-03-01 MED ORDER — COVID-19 MRNA VAC-TRIS(PFIZER) 30 MCG/0.3ML IM SUSY: 0.3000 mL | PREFILLED_SYRINGE | Freq: Once | INTRAMUSCULAR | 0 refills | Status: AC

## 2024-06-02 LAB — LAB REPORT - SCANNED
A1c: 5.7
EGFR: 73

## 2024-06-14 ENCOUNTER — Encounter: Payer: Self-pay | Admitting: Family

## 2024-06-14 ENCOUNTER — Other Ambulatory Visit: Payer: Self-pay | Admitting: Medical

## 2024-06-14 ENCOUNTER — Ambulatory Visit: Admitting: Family

## 2024-06-14 VITALS — BP 130/80 | HR 76 | Temp 97.3°F | Ht 68.5 in | Wt 184.0 lb

## 2024-06-14 DIAGNOSIS — E559 Vitamin D deficiency, unspecified: Secondary | ICD-10-CM

## 2024-06-14 DIAGNOSIS — R42 Dizziness and giddiness: Secondary | ICD-10-CM | POA: Diagnosis not present

## 2024-06-14 DIAGNOSIS — Z8261 Family history of arthritis: Secondary | ICD-10-CM | POA: Diagnosis not present

## 2024-06-14 DIAGNOSIS — H538 Other visual disturbances: Secondary | ICD-10-CM | POA: Diagnosis not present

## 2024-06-14 DIAGNOSIS — R7989 Other specified abnormal findings of blood chemistry: Secondary | ICD-10-CM | POA: Diagnosis not present

## 2024-06-14 DIAGNOSIS — M2550 Pain in unspecified joint: Secondary | ICD-10-CM | POA: Diagnosis not present

## 2024-06-14 LAB — CBC WITH DIFFERENTIAL/PLATELET
Basophils Absolute: 0 K/uL (ref 0.0–0.1)
Basophils Relative: 0.7 % (ref 0.0–3.0)
Eosinophils Absolute: 0.1 K/uL (ref 0.0–0.7)
Eosinophils Relative: 2 % (ref 0.0–5.0)
HCT: 44.5 % (ref 39.0–52.0)
Hemoglobin: 14.9 g/dL (ref 13.0–17.0)
Lymphocytes Relative: 30.9 % (ref 12.0–46.0)
Lymphs Abs: 1.5 K/uL (ref 0.7–4.0)
MCHC: 33.4 g/dL (ref 30.0–36.0)
MCV: 92.1 fl (ref 78.0–100.0)
Monocytes Absolute: 0.5 K/uL (ref 0.1–1.0)
Monocytes Relative: 9.5 % (ref 3.0–12.0)
Neutro Abs: 2.8 K/uL (ref 1.4–7.7)
Neutrophils Relative %: 56.9 % (ref 43.0–77.0)
Platelets: 289 K/uL (ref 150.0–400.0)
RBC: 4.84 Mil/uL (ref 4.22–5.81)
RDW: 13.2 % (ref 11.5–15.5)
WBC: 5 K/uL (ref 4.0–10.5)

## 2024-06-14 LAB — HEPATIC FUNCTION PANEL
ALT: 18 U/L (ref 3–53)
AST: 14 U/L (ref 5–37)
Albumin: 4.4 g/dL (ref 3.5–5.2)
Alkaline Phosphatase: 100 U/L (ref 39–117)
Bilirubin, Direct: 0.1 mg/dL (ref 0.1–0.3)
Total Bilirubin: 0.4 mg/dL (ref 0.2–1.2)
Total Protein: 6.8 g/dL (ref 6.0–8.3)

## 2024-06-14 LAB — C-REACTIVE PROTEIN: CRP: <0.5 mg/dL — ABNORMAL LOW (ref 1.0–20.0)

## 2024-06-14 LAB — SEDIMENTATION RATE: Sed Rate: 11 mm/h (ref 0–20)

## 2024-06-14 MED ORDER — CHOLECALCIFEROL 1.25 MG (50000 UT) PO TABS: 1.0000 | ORAL_TABLET | ORAL | 0 refills | Status: AC

## 2024-06-14 MED ORDER — EZETIMIBE 10 MG PO TABS: 10.0000 mg | ORAL_TABLET | Freq: Every day | ORAL | 0 refills | Status: DC

## 2024-06-14 NOTE — Progress Notes (Signed)
 "  Established Patient Office Visit  Subjective:      CC:  Chief Complaint  Patient presents with   Acute Visit    Episode of blurry vision while sitting in chair at home, pt stated it was painful and he felt dizzy, Lasted 1 day 1/2 Came and went, states he is fine today     HPI: Jeffrey Spence is a 63 y.o. male presenting on 06/14/2024 for Acute Visit (Episode of blurry vision while sitting in chair at home, pt stated it was painful and he felt dizzy, Lasted 1 day 1/2/Came and went, states he is fine today ) .  Discussed the use of AI scribe software for clinical note transcription with the patient, who gave verbal consent to proceed.  History of Present Illness Jeffrey Spence is a 63 year old male with coronary artery disease who presents with sudden onset of double vision and equilibrium issues.  On Monday, after returning home from work, he experienced sudden onset of double vision when he picked up his phone. He described the vision as 'everything went double' and 'shifted to the right, split'. This was accompanied by a sensation of the muscles in the back of his eyes being pulled and trouble with equilibrium, causing him to lean to the right when walking. The symptoms persisted constantly for over a day, resolving by the afternoon of the following day.  No complete blackout of vision, slurred speech, or confusion during the episode. No chest pain, palpitations, shortness of breath, or sound sensitivity. He denies any recent falls, head trauma, alcohol consumption, or neck pain. He reports no history of migraines, though he occasionally experiences headaches that are relieved by Tylenol .  His last eye exam was approximately a year ago, and he wears bifocals. He has a history of sinus congestion due to allergies, for which he occasionally takes Benadryl. He reports no recent changes in his sinus symptoms.  He has been taking Tylenol  more frequently over the past few months. He  denies regular alcohol consumption, stating he drinks 'once a quarter'. He smokes occasionally.         Social history:  Relevant past medical, surgical, family and social history reviewed and updated as indicated. Interim medical history since our last visit reviewed.  Allergies and medications reviewed and updated.  DATA REVIEWED: CHART IN EPIC     ROS: Negative unless specifically indicated above in HPI.   Current Medications[1]        Objective:        BP 130/80 (BP Location: Right Arm, Patient Position: Sitting, Cuff Size: Normal)   Pulse 76   Temp (!) 97.3 F (36.3 C) (Oral)   Ht 5' 8.5 (1.74 m)   Wt 184 lb (83.5 kg)   SpO2 99%   BMI 27.57 kg/m   Physical Exam NEUROLOGICAL: No pain on eye movement.  Wt Readings from Last 3 Encounters:  06/14/24 184 lb (83.5 kg)  10/29/23 182 lb 12.8 oz (82.9 kg)  05/06/23 189 lb 6 oz (85.9 kg)    Physical Exam Vitals reviewed.  Constitutional:      General: He is not in acute distress.    Appearance: Normal appearance. He is obese. He is not ill-appearing, toxic-appearing or diaphoretic.  HENT:     Head: Normocephalic.     Right Ear: Tympanic membrane normal.     Left Ear: Tympanic membrane normal.     Nose: Nose normal.     Mouth/Throat:  Mouth: Mucous membranes are moist.  Eyes:     General: Lids are normal. Vision grossly intact. Gaze aligned appropriately. No allergic shiner, visual field deficit or scleral icterus.       Right eye: No discharge.        Left eye: No discharge.     Extraocular Movements: Extraocular movements intact.     Right eye: Normal extraocular motion.     Left eye: Normal extraocular motion.     Conjunctiva/sclera: Conjunctivae normal.     Pupils: Pupils are equal, round, and reactive to light.  Cardiovascular:     Rate and Rhythm: Normal rate and regular rhythm.  Pulmonary:     Effort: Pulmonary effort is normal.     Breath sounds: Normal breath sounds. No wheezing.   Abdominal:     Tenderness: There is no abdominal tenderness.  Musculoskeletal:        General: Normal range of motion.     Cervical back: Normal range of motion.  Neurological:     General: No focal deficit present.     Mental Status: He is alert and oriented to person, place, and time. Mental status is at baseline.     Cranial Nerves: Cranial nerves 2-12 are intact. No cranial nerve deficit or facial asymmetry.     Sensory: Sensation is intact.     Motor: Motor function is intact. No weakness or pronator drift.     Coordination: Coordination is intact. Romberg sign negative. Coordination normal. Finger-Nose-Finger Test and Heel to [facility] Test normal. Rapid alternating movements normal.     Gait: Gait is intact.  Psychiatric:        Mood and Affect: Mood normal.        Behavior: Behavior normal.        Thought Content: Thought content normal.        Judgment: Judgment normal.          Results Labs Glucose (06/01/2024): Mildly elevated Alk Phos (06/01/2024): Elevated AST (06/01/2024): Elevated ALT (06/01/2024): Elevated Chol (06/01/2024): 67 LDL (06/01/2024): Less than 70 TSH (06/01/2024): Within normal limits PSA (06/01/2024): Within normal limits Hgb (06/01/2024): No anemia HbA1c (06/01/2024): 5.7, prediabetic range Vitamin D  (06/01/2024): Low ALT 49 AST 42 Alk phos 141  LDL 67 total chol 126 HDL 38 TSH  A1C 5.7 Vitamin D  21.3 B12 391   Assessment & Plan:   Assessment and Plan Assessment & Plan Acute binocular diplopia and dizziness Experienced acute binocular diplopia and dizziness lasting over a day, with vision splitting and equilibrium issues. Symptoms resolved by the end of the day. Differential diagnosis includes TIA or ocular migraine. No residual symptoms or neurological deficits. Neurological exam was reassuring. CT of the head not indicated due to lack of residual effects. - Advised to seek immediate medical attention if symptoms recur. - Continue  current medications including aspirin  and rivaroxaban .  Elevated liver function tests Liver function tests (alkaline phosphatase, AST, ALT) were elevated. No recent alcohol consumption or significant Tylenol  use. Possible transient elevation due to recent Tylenol  use. No abdominal pain or other symptoms suggestive of liver pathology. - Will repeat liver function tests in two weeks. - Advised to avoid excessive Tylenol  use.  Vitamin D  deficiency Vitamin D  levels are significantly low. Currently not taking vitamin D  supplements. - Prescribed high-dose vitamin D  once weekly for 12 weeks. - Advised to restart over-the-counter vitamin D  supplementation after 12 weeks.  Polyarthralgia Persistent joint pain, particularly in one finger, with popping sounds upon movement. No  new symptoms reported. - Continue current management and monitor symptoms.  Prediabetes A1c is at the lower end of the prediabetic range (5.7%). No significant symptoms reported. - Continue monitoring blood glucose levels. - Advised on lifestyle modifications to manage prediabetes.  Mixed hyperlipidemia Cholesterol levels are well-controlled with current medication regimen. LDL is below target goal. - Continue current lipid-lowering therapy.  General Health Maintenance Due for an eye exam. Discussed allergy management options. - Schedule an eye exam. - Consider switching to Zyrtec  or Claritin for allergy management. - Consider using Flonase  or Nostri for nasal congestion.  Recording duration: 21 minutes Smoking cessation instruction/counseling given:  counseled patient on the dangers of tobacco use, advised patient to stop smoking, and reviewed strategies to maximize success      Return in about 3 months (around 09/12/2024) for f/u CPE.     Ginger Patrick, MSN, APRN, FNP-C [facility]        [1]  Current Outpatient Medications:    aspirin  81 MG tablet, Take 1 tablet (81 mg total)  by mouth daily., Disp: , Rfl:    cetirizine  (ZYRTEC ) 10 MG tablet, TAKE 1 TABLET(10 MG) BY MOUTH DAILY AS NEEDED, Disp: 90 tablet, Rfl: 1   Cholecalciferol  (VITAMIN D3) 50 MCG (2000 UT) capsule, Take 2,000 Units by mouth daily., Disp: , Rfl:    Cholecalciferol  1.25 MG (50000 UT) TABS, Take 1 tablet by mouth once a week., Disp: 12 tablet, Rfl: 0   clobetasol  cream (TEMOVATE ) 0.05 %, Apply 1 Application topically 2 (two) times daily., Disp: 30 g, Rfl: 0   Cyanocobalamin  (B-12) 3000 MCG CAPS, Take 1 capsule by mouth daily., Disp: , Rfl:    ezetimibe  (ZETIA ) 10 MG tablet, Take 1 tablet (10 mg total) by mouth daily., Disp: 30 tablet, Rfl: 0   metoprolol  tartrate (LOPRESSOR ) 25 MG tablet, Take 0.5 tablets (12.5 mg total) by mouth 2 (two) times daily., Disp: 90 tablet, Rfl: 3   Multiple Vitamins-Minerals (MULTIVITAMIN WITH MINERALS) tablet, Take 1 tablet by mouth daily., Disp: , Rfl:    rivaroxaban  (XARELTO ) 2.5 MG TABS tablet, TAKE 1 TABLET(2.5 MG) BY MOUTH TWICE DAILY, Disp: 180 tablet, Rfl: 3   rosuvastatin  (CRESTOR ) 20 MG tablet, TAKE 1 TABLET(20 MG) BY MOUTH DAILY, Disp: 90 tablet, Rfl: 3  "

## 2024-06-15 LAB — ANA W/REFLEX: ANA Titer 1: NEGATIVE

## 2024-06-15 LAB — HEPATITIS PANEL, ACUTE
Hep A IgM: NONREACTIVE
Hep B C IgM: NONREACTIVE
Hepatitis B Surface Ag: NONREACTIVE
Hepatitis C Ab: NONREACTIVE

## 2024-06-15 LAB — RHEUMATOID FACTOR: Rheumatoid fact SerPl-aCnc: <10 [IU]/mL

## 2024-06-16 ENCOUNTER — Ambulatory Visit: Payer: Self-pay | Admitting: Family

## 2024-07-01 ENCOUNTER — Other Ambulatory Visit: Payer: Self-pay | Admitting: Cardiovascular Disease

## 2024-07-01 ENCOUNTER — Other Ambulatory Visit: Payer: Self-pay | Admitting: Medical

## 2024-07-01 DIAGNOSIS — I25110 Atherosclerotic heart disease of native coronary artery with unstable angina pectoris: Secondary | ICD-10-CM

## 2024-07-03 NOTE — Telephone Encounter (Signed)
 Pt last saw Cadence Furth, Jeffrey Spence on 05/06/23, last labs 06/01/24 Creat 1.13, age 64, weight 83.5kg, CrCl 79.03, based on CrCl pt is on appropriate dosage of Xarelto  2.5mg  BID for CAD.  Will refill rx.

## 2024-07-03 NOTE — Telephone Encounter (Signed)
 Duplicate request.  Pt last saw Cadence Furth, [place] on 05/06/23, last labs 06/01/24 Creat 1.13, age 64, weight 83.5kg, CrCl 79.03, based on CrCl pt is on appropriate dosage of Xarelto  2.5mg  BID for CAD.  Will refill rx.

## 2024-07-05 ENCOUNTER — Other Ambulatory Visit: Payer: Self-pay | Admitting: Medical

## 2024-07-06 ENCOUNTER — Telehealth: Payer: Self-pay | Admitting: Cardiovascular Disease

## 2024-07-06 NOTE — Telephone Encounter (Signed)
 Pt requesting medications refills but would need a f/u appt as well however pt refused. No refills sent.

## 2024-07-06 NOTE — Telephone Encounter (Signed)
 In accordance with refill protocols, please review and address the following requirements before this medication refill can be authorized:  Labs

## 2024-07-06 NOTE — Telephone Encounter (Signed)
 Called and spoke with the patient to let him know that refills for his medication cannot be sent without him being seen in the office since it has been over a year since his last office visit.  Patient agreeable to have an office visit.  Request sent to scheduling to have the patient scheduled for an office visit.

## 2024-07-11 ENCOUNTER — Encounter: Payer: Self-pay | Admitting: Medical

## 2024-07-11 ENCOUNTER — Ambulatory Visit: Attending: Medical | Admitting: Medical

## 2024-07-11 VITALS — BP 120/62 | HR 71 | Ht 67.75 in | Wt 186.8 lb

## 2024-07-11 DIAGNOSIS — Z79899 Other long term (current) drug therapy: Secondary | ICD-10-CM | POA: Diagnosis not present

## 2024-07-11 DIAGNOSIS — Z720 Tobacco use: Secondary | ICD-10-CM | POA: Diagnosis not present

## 2024-07-11 DIAGNOSIS — I25110 Atherosclerotic heart disease of native coronary artery with unstable angina pectoris: Secondary | ICD-10-CM

## 2024-07-11 DIAGNOSIS — E782 Mixed hyperlipidemia: Secondary | ICD-10-CM

## 2024-07-11 NOTE — Progress Notes (Unsigned)
 " Cardiology Office Note   Date:  07/12/2024  ID:  Jeffrey Spence, DOB 04/12/1961, MRN [id number] PCP: Corwin Antu, FNP  [facility] Providers Cardiologist:  None    History of Present Illness Jeffrey Spence is a 64 y.o. male  with a hx of smoking, CAD s/p NSTEMI and CABG in 2017, post-op afib, PAD on Xarelto  who presents for 1 year follow-up.    H/o CAD with NSTEMI and subsequent CABG in 2017. Echo at that time LVEF 55 to 60%, grade 1 diastolic dysfunction.  Patient was last seen 04/2023 and was doing well from a cardiac perspective.  Today, the patient is overall doing well. He denies chest pain, SOB, lower leg edema.  Last month he had an episode of double vision x 24 hours.  He saw PCP who sent him to an eye doctor. He did not have significant dizziness or lightheadedness. He denies orthopnea or pnd. The patient does no formal activity, but he is on his feet all day at work. Diet is fairly healthy. He is still smoking 3-4 cigarettes daily.   Studies Reviewed EKG Interpretation Date/Time:  Tuesday July 11 2024 14:37:54 EST Ventricular Rate:  71 PR Interval:  154 QRS Duration:  92 QT Interval:  402 QTC Calculation: 436 R Axis:   5  Text Interpretation: Normal sinus rhythm Normal ECG When compared with ECG of 06-May-2023 15:35, No significant change was found Confirmed by Franchester Mail ([id number]) on 07/11/2024 2:41:38 PM    Echo 2017 Study Conclusions   - Left ventricle: The cavity size was normal. Systolic function was    normal. The estimated ejection fraction was in the range of 55%    to 60%. Although no diagnostic regional wall motion abnormality    was identified, this possibility cannot be completely excluded on    the basis of this study. Doppler parameters are consistent with    abnormal left ventricular relaxation (grade 1 diastolic    dysfunction).  - Aortic valve: Trileaflet; mildly thickened, mildly calcified    leaflets.    LHC 06/2015 Left Heart  Cath and Coronary Angiography    Conclusion   Ost RCA to Prox RCA lesion, 90% stenosed. Ost LM lesion, 90% stenosed. Ost LAD to Prox LAD lesion, 90% stenosed. Prox Cx to Mid Cx lesion, 80% stenosed. The left ventricular systolic function is normal.      Physical Exam VS:  BP 120/62 (BP Location: Left Arm, Patient Position: Sitting, Cuff Size: Normal)   Pulse 71   Ht 5' 7.75 (1.721 m)   Wt 186 lb 12.8 oz (84.7 kg)   SpO2 97%   BMI 28.61 kg/m        Wt Readings from Last 3 Encounters:  07/11/24 186 lb 12.8 oz (84.7 kg)  06/14/24 184 lb (83.5 kg)  10/29/23 182 lb 12.8 oz (82.9 kg)    GEN: Well nourished, well developed in no acute distress NECK: No JVD; No carotid bruits CARDIAC: RRR, no murmurs, rubs, gallops RESPIRATORY:  Clear to auscultation without rales, wheezing or rhonchi  ABDOMEN: Soft, non-tender, non-distended EXTREMITIES:  No edema; No deformity   ASSESSMENT AND PLAN  CAD s/p CABG x 3 in 2017 The patient denies anginal symptoms. He does no formal activity, but he is on his feet all day at work. Continue ASA, zetia , lopressor  12.5mg  BID, Xarelto  2.5mg  BID, Crestor  20mg  daily.   HLD I will update a lipid profile today. Continue Crestor  20mg  daily.   Tobacco use  He is still smoking, cessation recommended.        Dispo: Follow-up in 1 year  Signed, Arina Torry VEAR Fishman, PA-C   "

## 2024-07-11 NOTE — Patient Instructions (Signed)
 Medication Instructions:  Your physician recommends that you continue on your current medications as directed. Please refer to the Current Medication list given to you today.    *If you need a refill on your cardiac medications before your next appointment, please call your pharmacy*  Lab Work: Your provider would like for you to have following labs drawn today Lipid, Direct LDL.     Testing/Procedures: No test ordered today   Follow-Up: At [facility], you and your health needs are our priority.  As part of our continuing mission to provide you with exceptional heart care, our providers are all part of one team.  This team includes your primary Cardiologist (physician) and Advanced Practice Providers or APPs (Physician Assistants and Nurse Practitioners) who all work together to provide you with the care you need, when you need it.  Your next appointment:   1 year(s)  Provider:   Mikey Fishman, PA-C

## 2024-07-12 LAB — LIPID PANEL
Chol/HDL Ratio: 3.2 ratio (ref 0.0–5.0)
Cholesterol, Total: 134 mg/dL (ref 100–199)
HDL: 42 mg/dL
LDL Chol Calc (NIH): 64 mg/dL (ref 0–99)
Triglycerides: 168 mg/dL — ABNORMAL HIGH (ref 0–149)
VLDL Cholesterol Cal: 28 mg/dL (ref 5–40)

## 2024-07-12 LAB — LDL CHOLESTEROL, DIRECT: LDL Direct: 66 mg/dL (ref 0–99)

## 2024-07-13 ENCOUNTER — Ambulatory Visit: Payer: Self-pay | Admitting: Medical

## 2024-07-28 ENCOUNTER — Other Ambulatory Visit: Payer: Self-pay

## 2024-07-28 MED ORDER — METOPROLOL TARTRATE 25 MG PO TABS: 12.5000 mg | ORAL_TABLET | Freq: Two times a day (BID) | ORAL | 3 refills | Status: AC
